# Patient Record
Sex: Female | Born: 1945 | Race: White | Hispanic: No | Marital: Married | Smoking: Light tobacco smoker
Health system: Southern US, Community
[De-identification: ages and names within clinical notes are randomized; demographics above are authoritative.]

## PROBLEM LIST (undated history)

## (undated) DIAGNOSIS — IMO0001 Reserved for inherently not codable concepts without codable children: Secondary | ICD-10-CM

## (undated) DIAGNOSIS — I10 Essential (primary) hypertension: Secondary | ICD-10-CM

## (undated) DIAGNOSIS — G459 Transient cerebral ischemic attack, unspecified: Secondary | ICD-10-CM

## (undated) DIAGNOSIS — F419 Anxiety disorder, unspecified: Secondary | ICD-10-CM

## (undated) DIAGNOSIS — J45909 Unspecified asthma, uncomplicated: Secondary | ICD-10-CM

## (undated) DIAGNOSIS — J449 Chronic obstructive pulmonary disease, unspecified: Secondary | ICD-10-CM

## (undated) DIAGNOSIS — E039 Hypothyroidism, unspecified: Secondary | ICD-10-CM

## (undated) DIAGNOSIS — D649 Anemia, unspecified: Secondary | ICD-10-CM

## (undated) DIAGNOSIS — M436 Torticollis: Secondary | ICD-10-CM

## (undated) HISTORY — PX: CARDIAC CATHETERIZATION: SHX172

## (undated) HISTORY — PX: CERVICAL FUSION: SHX112

## (undated) HISTORY — DX: Hypothyroidism, unspecified: E03.9

---
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
## 1997-04-23 HISTORY — PX: DILATION AND CURETTAGE OF UTERUS: SHX78

## 2005-09-14 ENCOUNTER — Other Ambulatory Visit: Payer: Self-pay

## 2005-09-15 ENCOUNTER — Inpatient Hospital Stay: Payer: Self-pay | Admitting: Internal Medicine

## 2006-11-21 ENCOUNTER — Ambulatory Visit: Payer: Self-pay | Admitting: Internal Medicine

## 2007-04-12 ENCOUNTER — Inpatient Hospital Stay: Payer: Self-pay | Admitting: Internal Medicine

## 2007-04-12 ENCOUNTER — Other Ambulatory Visit: Payer: Self-pay

## 2007-05-30 ENCOUNTER — Ambulatory Visit: Payer: Self-pay | Admitting: Endocrinology

## 2008-04-12 ENCOUNTER — Ambulatory Visit: Payer: Self-pay | Admitting: Internal Medicine

## 2008-12-30 ENCOUNTER — Emergency Department: Payer: Self-pay | Admitting: Emergency Medicine

## 2009-08-13 ENCOUNTER — Ambulatory Visit: Payer: Self-pay | Admitting: Internal Medicine

## 2009-09-24 ENCOUNTER — Ambulatory Visit: Payer: Self-pay | Admitting: Internal Medicine

## 2009-12-21 ENCOUNTER — Ambulatory Visit: Payer: Self-pay | Admitting: Endocrinology

## 2010-01-12 ENCOUNTER — Ambulatory Visit: Payer: Self-pay | Admitting: Cardiology

## 2010-03-24 ENCOUNTER — Ambulatory Visit: Payer: Self-pay | Admitting: Family Medicine

## 2011-02-06 ENCOUNTER — Ambulatory Visit: Payer: Self-pay

## 2011-02-23 ENCOUNTER — Ambulatory Visit: Payer: Self-pay | Admitting: Gastroenterology

## 2011-03-07 ENCOUNTER — Ambulatory Visit: Payer: Self-pay | Admitting: Internal Medicine

## 2013-03-31 ENCOUNTER — Ambulatory Visit: Payer: Self-pay | Admitting: Family Medicine

## 2013-04-07 ENCOUNTER — Emergency Department: Payer: Self-pay | Admitting: Emergency Medicine

## 2013-04-07 LAB — URINALYSIS, COMPLETE
Glucose,UR: NEGATIVE mg/dL (ref 0–75)
Ph: 6 (ref 4.5–8.0)
Protein: NEGATIVE
RBC,UR: 3 /HPF (ref 0–5)
Squamous Epithelial: 21
WBC UR: 2 /HPF (ref 0–5)

## 2013-04-07 LAB — COMPREHENSIVE METABOLIC PANEL
Albumin: 3.8 g/dL (ref 3.4–5.0)
Anion Gap: 0 — ABNORMAL LOW (ref 7–16)
Calcium, Total: 9.7 mg/dL (ref 8.5–10.1)
EGFR (African American): >60
EGFR (Non-African Amer.): >60
Osmolality: 274 (ref 275–301)
SGOT(AST): 17 U/L (ref 15–37)
SGPT (ALT): 23 U/L (ref 12–78)

## 2013-04-07 LAB — CBC
HCT: 41.3 % (ref 35.0–47.0)
HGB: 13.3 g/dL (ref 12.0–16.0)
MCH: 26.6 pg (ref 26.0–34.0)
RBC: 4.98 10*6/uL (ref 3.80–5.20)
RDW: 15.4 % — ABNORMAL HIGH (ref 11.5–14.5)

## 2013-07-31 ENCOUNTER — Ambulatory Visit: Payer: Self-pay | Admitting: Endocrinology

## 2014-10-27 ENCOUNTER — Encounter: Payer: Self-pay | Admitting: General Practice

## 2014-10-27 ENCOUNTER — Observation Stay
Admission: EM | Admit: 2014-10-27 | Discharge: 2014-10-28 | Disposition: A | Discharge status: Home / Self Care | Payer: Medicare Other | Attending: Internal Medicine | Admitting: Internal Medicine

## 2014-10-27 ENCOUNTER — Emergency Department: Payer: Medicare Other

## 2014-10-27 DIAGNOSIS — Z8249 Family history of ischemic heart disease and other diseases of the circulatory system: Secondary | ICD-10-CM | POA: Diagnosis not present

## 2014-10-27 DIAGNOSIS — Z9981 Dependence on supplemental oxygen: Secondary | ICD-10-CM | POA: Insufficient documentation

## 2014-10-27 DIAGNOSIS — Z7982 Long term (current) use of aspirin: Secondary | ICD-10-CM | POA: Insufficient documentation

## 2014-10-27 DIAGNOSIS — Z880 Allergy status to penicillin: Secondary | ICD-10-CM | POA: Diagnosis not present

## 2014-10-27 DIAGNOSIS — H538 Other visual disturbances: Secondary | ICD-10-CM | POA: Diagnosis not present

## 2014-10-27 DIAGNOSIS — I10 Essential (primary) hypertension: Secondary | ICD-10-CM | POA: Diagnosis not present

## 2014-10-27 DIAGNOSIS — J9610 Chronic respiratory failure, unspecified whether with hypoxia or hypercapnia: Secondary | ICD-10-CM | POA: Diagnosis present

## 2014-10-27 DIAGNOSIS — IMO0002 Reserved for concepts with insufficient information to code with codable children: Secondary | ICD-10-CM

## 2014-10-27 DIAGNOSIS — Z887 Allergy status to serum and vaccine status: Secondary | ICD-10-CM | POA: Diagnosis not present

## 2014-10-27 DIAGNOSIS — J449 Chronic obstructive pulmonary disease, unspecified: Secondary | ICD-10-CM | POA: Diagnosis not present

## 2014-10-27 DIAGNOSIS — H3561 Retinal hemorrhage, right eye: Secondary | ICD-10-CM | POA: Diagnosis not present

## 2014-10-27 DIAGNOSIS — Z79899 Other long term (current) drug therapy: Secondary | ICD-10-CM | POA: Diagnosis not present

## 2014-10-27 DIAGNOSIS — F17210 Nicotine dependence, cigarettes, uncomplicated: Secondary | ICD-10-CM | POA: Diagnosis not present

## 2014-10-27 DIAGNOSIS — H34233 Retinal artery branch occlusion, bilateral: Secondary | ICD-10-CM

## 2014-10-27 DIAGNOSIS — E039 Hypothyroidism, unspecified: Secondary | ICD-10-CM | POA: Diagnosis not present

## 2014-10-27 DIAGNOSIS — Z79890 Hormone replacement therapy (postmenopausal): Secondary | ICD-10-CM | POA: Insufficient documentation

## 2014-10-27 DIAGNOSIS — I639 Cerebral infarction, unspecified: Secondary | ICD-10-CM | POA: Diagnosis present

## 2014-10-27 DIAGNOSIS — H3582 Retinal ischemia: Secondary | ICD-10-CM | POA: Diagnosis present

## 2014-10-27 DIAGNOSIS — F419 Anxiety disorder, unspecified: Secondary | ICD-10-CM | POA: Diagnosis not present

## 2014-10-27 HISTORY — DX: Chronic obstructive pulmonary disease, unspecified: J44.9

## 2014-10-27 HISTORY — DX: Essential (primary) hypertension: I10

## 2014-10-27 HISTORY — DX: Anxiety disorder, unspecified: F41.9

## 2014-10-27 HISTORY — DX: Reserved for inherently not codable concepts without codable children: IMO0001

## 2014-10-27 LAB — CBC
HCT: 40.8 % (ref 35.0–47.0)
HEMOGLOBIN: 13.1 g/dL (ref 12.0–16.0)
MCH: 28 pg (ref 26.0–34.0)
MCHC: 32 g/dL (ref 32.0–36.0)
MCV: 87.4 fL (ref 80.0–100.0)
Platelets: 138 10*3/uL — ABNORMAL LOW (ref 150–440)
RBC: 4.67 MIL/uL (ref 3.80–5.20)
RDW: 14.7 % — ABNORMAL HIGH (ref 11.5–14.5)
WBC: 5.9 10*3/uL (ref 3.6–11.0)

## 2014-10-27 LAB — DIFFERENTIAL
BASOS ABS: 0 10*3/uL (ref 0–0.1)
BASOS PCT: 0 %
Eosinophils Absolute: 0 10*3/uL (ref 0–0.7)
Eosinophils Relative: 1 %
LYMPHS PCT: 25 %
Lymphs Abs: 1.5 10*3/uL (ref 1.0–3.6)
MONO ABS: 0.5 10*3/uL (ref 0.2–0.9)
Monocytes Relative: 8 %
Neutro Abs: 3.9 10*3/uL (ref 1.4–6.5)
Neutrophils Relative %: 66 %

## 2014-10-27 LAB — PROTIME-INR
INR: 0.89
Prothrombin Time: 12.3 seconds (ref 11.4–15.0)

## 2014-10-27 LAB — COMPREHENSIVE METABOLIC PANEL
ALT: 12 U/L — ABNORMAL LOW (ref 14–54)
AST: 16 U/L (ref 15–41)
Albumin: 4.4 g/dL (ref 3.5–5.0)
Alkaline Phosphatase: 55 U/L (ref 38–126)
Anion gap: 8 (ref 5–15)
BUN: 9 mg/dL (ref 6–20)
CALCIUM: 9.2 mg/dL (ref 8.9–10.3)
CO2: 38 mmol/L — AB (ref 22–32)
CREATININE: 0.38 mg/dL — AB (ref 0.44–1.00)
Chloride: 94 mmol/L — ABNORMAL LOW (ref 101–111)
GLUCOSE: 93 mg/dL (ref 65–99)
Potassium: 4.2 mmol/L (ref 3.5–5.1)
Sodium: 140 mmol/L (ref 135–145)
Total Bilirubin: 0.6 mg/dL (ref 0.3–1.2)
Total Protein: 7.3 g/dL (ref 6.5–8.1)

## 2014-10-27 LAB — APTT: aPTT: 30 seconds (ref 24–36)

## 2014-10-27 LAB — TSH: TSH: 1.573 u[IU]/mL (ref 0.350–4.500)

## 2014-10-27 LAB — GLUCOSE, CAPILLARY: GLUCOSE-CAPILLARY: 93 mg/dL (ref 65–99)

## 2014-10-27 LAB — TROPONIN I

## 2014-10-27 MED ORDER — ASPIRIN 325 MG PO TABS
325.0000 mg | ORAL_TABLET | Freq: Every day | ORAL | Status: DC
  Administered 2014-10-28: 325 mg via ORAL
  Filled 2014-10-27 (×2): qty 1

## 2014-10-27 MED ORDER — ALPRAZOLAM 0.25 MG PO TABS
0.2500 mg | ORAL_TABLET | Freq: Two times a day (BID) | ORAL | Status: DC | PRN
  Administered 2014-10-28: 0.5 mg via ORAL
  Filled 2014-10-27: qty 2

## 2014-10-27 MED ORDER — SIMVASTATIN 20 MG PO TABS
20.0000 mg | ORAL_TABLET | Freq: Every day | ORAL | Status: DC
  Administered 2014-10-27: 20 mg via ORAL
  Filled 2014-10-27: qty 1

## 2014-10-27 MED ORDER — STROKE: EARLY STAGES OF RECOVERY BOOK
Freq: Once | Status: AC
  Administered 2014-10-27: 22:00:00

## 2014-10-27 MED ORDER — MONTELUKAST SODIUM 10 MG PO TABS
10.0000 mg | ORAL_TABLET | Freq: Every day | ORAL | Status: DC
  Administered 2014-10-27: 10 mg via ORAL
  Filled 2014-10-27: qty 1

## 2014-10-27 MED ORDER — LAMOTRIGINE 25 MG PO TABS
25.0000 mg | ORAL_TABLET | Freq: Every day | ORAL | Status: DC
  Filled 2014-10-27: qty 1

## 2014-10-27 MED ORDER — DOCUSATE SODIUM 100 MG PO CAPS: 100.0000 mg | ORAL_CAPSULE | Freq: Every day | ORAL | Status: DC

## 2014-10-27 MED ORDER — IPRATROPIUM-ALBUTEROL 0.5-2.5 (3) MG/3ML IN SOLN: 3.0000 mL | RESPIRATORY_TRACT | Status: DC | PRN

## 2014-10-27 MED ORDER — ACETAMINOPHEN 650 MG RE SUPP: 650.0000 mg | RECTAL | Status: DC | PRN

## 2014-10-27 MED ORDER — HEPARIN SODIUM (PORCINE) 5000 UNIT/ML IJ SOLN: 5000.0000 [IU] | Freq: Three times a day (TID) | INTRAMUSCULAR | Status: DC

## 2014-10-27 MED ORDER — LEVOTHYROXINE SODIUM 88 MCG PO TABS
88.0000 ug | ORAL_TABLET | Freq: Every day | ORAL | Status: DC
  Administered 2014-10-27 – 2014-10-28 (×2): 88 ug via ORAL
  Filled 2014-10-27: qty 1

## 2014-10-27 MED ORDER — NYSTATIN 100000 UNIT/ML MT SUSP: 5.0000 mL | OROMUCOSAL | Status: DC | PRN

## 2014-10-27 MED ORDER — ASPIRIN 81 MG PO CHEW
81.0000 mg | CHEWABLE_TABLET | Freq: Every day | ORAL | Status: DC
Start: 2014-10-27 — End: 2014-10-28
  Filled 2014-10-27: qty 1

## 2014-10-27 MED ORDER — TIOTROPIUM BROMIDE MONOHYDRATE 18 MCG IN CAPS
18.0000 ug | ORAL_CAPSULE | Freq: Every day | RESPIRATORY_TRACT | Status: DC
  Administered 2014-10-28: 14:00:00 18 ug via RESPIRATORY_TRACT
  Filled 2014-10-27: qty 5

## 2014-10-27 MED ORDER — ASPIRIN 300 MG RE SUPP
300.0000 mg | Freq: Every day | RECTAL | Status: DC
  Filled 2014-10-27 (×2): qty 1

## 2014-10-27 MED ORDER — ACETAMINOPHEN 325 MG PO TABS: 650.0000 mg | ORAL_TABLET | ORAL | Status: DC | PRN

## 2014-10-27 MED ORDER — OLANZAPINE 2.5 MG PO TABS
2.5000 mg | ORAL_TABLET | Freq: Every day | ORAL | Status: DC
  Administered 2014-10-27: 2.5 mg via ORAL
  Filled 2014-10-27: qty 1

## 2014-10-27 MED ORDER — AMLODIPINE BESYLATE 5 MG PO TABS
5.0000 mg | ORAL_TABLET | Freq: Every day | ORAL | Status: DC
  Administered 2014-10-27: 5 mg via ORAL
  Filled 2014-10-27: qty 1

## 2014-10-27 MED ORDER — HYDRALAZINE HCL 20 MG/ML IJ SOLN: 10.0000 mg | Freq: Four times a day (QID) | INTRAMUSCULAR | Status: DC | PRN

## 2014-10-27 MED ORDER — VENLAFAXINE HCL ER 75 MG PO CP24
150.0000 mg | ORAL_CAPSULE | Freq: Every day | ORAL | Status: DC
  Administered 2014-10-28: 11:00:00 150 mg via ORAL
  Filled 2014-10-27: qty 2

## 2014-10-27 MED ORDER — LEVALBUTEROL HCL 1.25 MG/0.5ML IN NEBU: 1.2500 mg | INHALATION_SOLUTION | RESPIRATORY_TRACT | Status: DC | PRN

## 2014-10-27 NOTE — H&P (Signed)
[facility] - [place] at [facility]   PATIENT NAME: Shannon Wilkerson    MR#:  [id number]  DATE OF BIRTH:  [birth year]  DATE OF ADMISSION:  10/27/2014  PRIMARY CARE PHYSICIAN: Alan Mulder, MD   REQUESTING/REFERRING PHYSICIAN:  Dr. Lenard Lance   CHIEF COMPLAINT:   Chief Complaint  Patient presents with  . Blurred Vision    HISTORY OF PRESENT ILLNESS:  Shannon Wilkerson  is a 69 y.o. female with a known history of COPD on chronic oxygen via nasal cannula at 2 L, hypertension who presents to the emergency room after her ophthalmology appointment today with concern for stroke due to new findings of multiple retinal hemorrhages. She reports that about 5 days ago she started having "fireworks" in the right eye which became more pronounced and more persistent. She was watching television and noted a gray defect in the lower part of her visual field with the right eye. She called for at the soonest available appointment with her ophthalmologist and was referred to a retinal specialist. She saw them today and was immediately referred to the emergency room for stroke evaluation. She denies any confusion, slurred speech, numbness or weakness. She does have a long history of palpitations but no known arrhythmia. She denies chest pain, lower extremity edema. She does note that she has episodes of vertigo particularly with head movement for the past few months. In the emergency room her EKG shows normal sinus rhythm and CT scan is negative for acute event.  PAST MEDICAL HISTORY:   Past Medical History  Diagnosis Date  . COPD (chronic obstructive pulmonary disease)   . Hypertension   . Shortness of breath dyspnea   . Anxiety     PAST SURGICAL HISTORY:   Past Surgical History  Procedure Laterality Date  . Cervical fusion      SOCIAL HISTORY:   History  Substance Use Topics  . Smoking status: Current Some Day Smoker    Types: Cigarettes  . Smokeless tobacco: Not on  file  . Alcohol Use: No    FAMILY HISTORY:   Family History  Problem Relation Age of Onset  . Stroke Mother   . CAD Mother   . CAD Father   . Alcohol abuse Brother     DRUG ALLERGIES:   Allergies  Allergen Reactions  . Fluvirin [Flu Virus Vaccine] Diarrhea  . Penicillins Hives  . Tetanus Toxoids Diarrhea    REVIEW OF SYSTEMS:   Review of Systems  Constitutional: Negative for fever, chills, weight loss and malaise/fatigue.  HENT: Negative for congestion and hearing loss.   Eyes: Positive for blurred vision. Negative for double vision, pain and discharge.  Respiratory: Negative for cough, hemoptysis, sputum production, shortness of breath, wheezing and stridor.   Cardiovascular: Negative for chest pain, palpitations, orthopnea and leg swelling.  Gastrointestinal: Negative for nausea, vomiting, abdominal pain, diarrhea, constipation and blood in stool.  Genitourinary: Negative for dysuria and frequency.  Musculoskeletal: Negative for myalgias, back pain, joint pain and neck pain.  Skin: Negative for rash.  Neurological: Positive for dizziness. Negative for sensory change, speech change, focal weakness, seizures, loss of consciousness and headaches.  Endo/Heme/Allergies: Does not bruise/bleed easily.  Psychiatric/Behavioral: Negative for depression and hallucinations. The patient is nervous/anxious.     MEDICATIONS AT HOME:   Prior to Admission medications   Medication Sig Start Date End Date Taking? Authorizing Provider  ALPRAZolam Prudy Feeler) 0.5 MG tablet Take 0.25-0.5 mg by mouth 2 (two) times daily as needed for  anxiety.    Yes Historical Provider, MD  Ascorbic Acid (VITAMIN C PO) Take 1 tablet by mouth daily.   Yes Historical Provider, MD  diphenhydramine-acetaminophen (TYLENOL PM) 25-500 MG TABS Take 2 tablets by mouth at bedtime as needed (for sleep).   Yes Historical Provider, MD  docusate sodium (COLACE) 100 MG capsule Take 100 mg by mouth at bedtime.   Yes Historical  Provider, MD  estrogen, conjugated,-medroxyprogesterone (PREMPRO) 0.625-2.5 MG per tablet Take 1 tablet by mouth 2 (two) times a week. Pt takes on Tuesday and Thursday.   Yes Historical Provider, MD  ipratropium-albuterol (DUONEB) 0.5-2.5 (3) MG/3ML SOLN Take 3 mLs by nebulization every 4 (four) hours as needed (for shortness of breath).   Yes Historical Provider, MD  lamoTRIgine (LAMICTAL) 25 MG tablet Take 25 mg by mouth at bedtime.   Yes Historical Provider, MD  levalbuterol [facility](XOPENEX HFA) 45 MCG/ACT inhaler Inhale 2 puffs into the lungs every 4 (four) hours as needed for wheezing.   Yes Historical Provider, MD  levothyroxine (SYNTHROID, LEVOTHROID) 88 MCG tablet Take 88 mcg by mouth daily.   Yes Historical Provider, MD  montelukast (SINGULAIR) 10 MG tablet Take 10 mg by mouth at bedtime.   Yes Historical Provider, MD  Multiple Vitamins-Minerals (ZINC PO) Take 1 tablet by mouth daily.   Yes Historical Provider, MD  nystatin (MYCOSTATIN) 100000 UNIT/ML suspension Take 5 mLs by mouth as needed (for thrush).   Yes Historical Provider, MD  OLANZapine (ZYPREXA) 2.5 MG tablet Take 2.5 mg by mouth at bedtime.   Yes Historical Provider, MD  simvastatin (ZOCOR) 20 MG tablet Take 20 mg by mouth at bedtime.   Yes Historical Provider, MD  tiotropium (SPIRIVA) 18 MCG inhalation capsule Place 18 mcg into inhaler and inhale daily.   Yes Historical Provider, MD  venlafaxine XR (EFFEXOR-XR) 150 MG 24 hr capsule Take 150 mg by mouth daily.   Yes Historical Provider, MD  Vitamin D, Ergocalciferol, (DRISDOL) 50000 UNITS CAPS capsule Take 50,000 Units by mouth every 7 (seven) days. Pt takes on Sunday.   Yes Historical Provider, MD  VITAMIN E PO Take 1 tablet by mouth daily.   Yes Historical Provider, MD      VITAL SIGNS:  Blood pressure 172/88, pulse 88, temperature 98.5 F (36.9 C), temperature source Oral, resp. rate 14, height 5\' 2"  (1.575 m), weight 68.04 kg (150 lb), SpO2 100 %.  PHYSICAL EXAMINATION:   GENERAL:  69 y.o.-year-old patient lying in the bed with no acute distress. Nasal cannula in place  EYES: Pupils equal, dilated. No scleral icterus. Extraocular muscles intact.  HEENT: Head atraumatic, normocephalic. Oropharynx and nasopharynx clear. Mucous membranes pink and moist NECK:  Supple, no jugular venous distention. No thyroid enlargement, no tenderness.  LUNGS: Scattered wheezes on the right upper lung fields, otherwise no rales, rhonchi or crepitation. No use of accessory muscles of respiration. Good air movement CARDIOVASCULAR: S1, S2 normal. No murmurs, rubs, or gallops.  ABDOMEN: Soft, nontender, nondistended. Bowel sounds present. No organomegaly or mass.  EXTREMITIES: Trace pedal edema over the left ankle, no cyanosis, or clubbing.  NEUROLOGIC: Cranial nerves II through XII are intact. Muscle strength 5/5 in all extremities. Sensation intact. Gait not checked.  PSYCHIATRIC: The patient is alert and oriented x 3. Anxious and tearful SKIN: No obvious rash, lesion, or ulcer.   LABORATORY PANEL:   CBC  Recent Labs Lab 10/27/14 1703  WBC 5.9  HGB 13.1  HCT 40.8  PLT 138*   ------------------------------------------------------------------------------------------------------------------  Chemistries   Recent Labs Lab 10/27/14 1703  NA 140  K 4.2  CL 94*  CO2 38*  GLUCOSE 93  BUN 9  CREATININE 0.38*  CALCIUM 9.2  AST 16  ALT 12*  ALKPHOS 55  BILITOT 0.6   ------------------------------------------------------------------------------------------------------------------  Cardiac Enzymes  Recent Labs Lab 10/27/14 1703  TROPONINI <0.03   ------------------------------------------------------------------------------------------------------------------  RADIOLOGY:  Ct Head Wo Contrast  10/27/2014   CLINICAL DATA:  69 year old female with visual changes over the last few days. Right eye blurred vision. Initial encounter.  EXAM: CT HEAD WITHOUT CONTRAST   TECHNIQUE: Contiguous axial images were obtained from the base of the skull through the vertex without intravenous contrast.  COMPARISON:  None.  FINDINGS: Mild right mastoid air cell opacification or sclerosis. Other visualized paranasal sinuses and mastoids are clear. The right tympanic cavity appears clear.  No acute osseous abnormality identified.  Visualized orbit soft tissues are within normal limits. Visualized scalp soft tissues are within normal limits.  Mild Calcified atherosclerosis at the skull base. Cerebral volume is within normal limits for age. No ventriculomegaly. No midline shift, mass effect, or evidence of intracranial mass lesion. No suspicious intracranial vascular hyperdensity. Mild for age nonspecific mostly subcortical white matter hypodensity. No evidence of cortically based acute infarction identified. Questionable mild cortical encephalomalacia along the right sylvian fissure (series 2, image 12). No acute intracranial hemorrhage identified.  IMPRESSION: 1. No acute cortically based infarct or acute intracranial abnormality identified. 2. Mild for age nonspecific white matter changes. Possible mild cortical encephalomalacia along the right sylvian fissure.   Electronically Signed   By: Odessa Fleming M.D.   On: 10/27/2014 17:29    EKG:   Orders placed or performed during the hospital encounter of 10/27/14  . EKG  . EKG    IMPRESSION AND PLAN:  Principal Problem:   CVA (cerebral infarction) Active Problems:   COPD (chronic obstructive pulmonary disease)   Chronic respiratory failure   Anxiety   Hypertension  #1 possible CVA: Patient referred directly to the emergency room by her ophthalmologist after new finding of multiple retinal hemorrhages in a "shower" pattern today. CT scan negative. She has no other neurologic defects. Will obtain an MRI and carotid Dopplers. Will admit to observation and monitor on telemetry. We will check lipids and hemoglobin A1c. Start aspirin. If  stroke workup is negative would consider workup for other potential causes of retinal hemorrhage including autoimmune disorders or infectious etiologies.   #2 COPD with chronic respiratory failure requiring 2 L of nasal cannula: Stable and unchanged. Continue supplemental oxygen. Continue home inhalers.  #3 hypertension: Poorly controlled. In the setting of possible acute stroke we'll allow for permissive hypertension. We'll provide when necessary hydralazine for systolics above 180. Upon discharge or in the near future she would benefit from improved blood pressure control.  #4 anxiety: She is anxious and tearful in the exam. Will continue home medications including Effexor, Zyprexa, Lamictal, Xanax.  #5 hypothyroidism: Check TSH and continue Synthroid    All the records are reviewed and case discussed with ED provider. Management plans discussed with the patient, family and they are in agreement.  CODE STATUS: full  TOTAL TIME TAKING CARE OF THIS PATIENT: 55 minutes.    Elby Showers M.D on 10/27/2014 at 7:22 PM  Between 7am to 6pm - Pager - [phone]  After 6pm go to [email or web address] - password [email or web address] [facility]  [facility]  Office  [phone]  CC: Primary care physician; Alan Mulder, MD

## 2014-10-27 NOTE — ED Notes (Signed)
Pt. Arrived from eye apt, sent over due to "doctor said i had a stroke in my right eye". Pt reported that she has been experiencing vision changes over the last few days. Pt states she started having "flashes to the right eye" over the last few days and on Saturday she felt a "grayness" to eye. Pt alert and oriented. Denies weakness, denies slurred speech. Pt states "it was just my eye".

## 2014-10-27 NOTE — ED Provider Notes (Signed)
[facility] Emergency Department Provider Note  Time seen: 5:56 PM  I have reviewed the triage vital signs and the nursing notes.   HISTORY  Chief Complaint Blurred Vision    HPI Shannon Wilkerson is a 69 y.o. female with a past medical history of COPD and hypertension presents the emergency department from her eye doctor for concern for a stroke. According to the patient for the past 4 days she has been having intermittent flashes of light, and visual changes with decreased vision in bilateral eyes. She saw Dr. Sharman Crate today at [facility], and was told to come to the emergency department for a stroke workup. I discussed with Dr. Sharman Crate, and he states the exam was concerning for bilateral central retinal artery occlusions from likely embolic showering. Patient denies any headache, focal weakness or numbness, slurred speech, confusion, or facial droop. Patient describes her visual changes as moderate, they've not been worsening, but have not been improving per the patient.     Past Medical History  Diagnosis Date  . COPD (chronic obstructive pulmonary disease)   . Hypertension     There are no active problems to display for this patient.   Past Surgical History  Procedure Laterality Date  . Cervical fusion      No current outpatient prescriptions on file.  Allergies Penicillins and Tetanus toxoids  No family history on file.  Social History History  Substance Use Topics  . Smoking status: Current Some Day Smoker    Types: Cigarettes  . Smokeless tobacco: Not on file  . Alcohol Use: No    Review of Systems Constitutional: Negative for fever. Eyes: Positive for decreased vision and flashes of light in bilateral eyes. Cardiovascular: Negative for chest pain. Respiratory: Negative for shortness of breath. Gastrointestinal: Negative for abdominal pain Musculoskeletal: Negative for back pain. Neurological: Negative for headaches,  focal weakness or numbness.  10-point ROS otherwise negative.  ____________________________________________   PHYSICAL EXAM:  VITAL SIGNS: ED Triage Vitals  Enc Vitals Group     BP 10/27/14 1654 162/107 mmHg     Pulse Rate 10/27/14 1654 98     Resp 10/27/14 1654 18     Temp 10/27/14 1654 98.5 F (36.9 C)     Temp Source 10/27/14 1654 Oral     SpO2 10/27/14 1654 97 %     Weight 10/27/14 1654 150 lb (68.04 kg)     Height 10/27/14 1654  (1.575 m)     Head Cir --      Peak Flow --      Pain Score --      Pain Loc --      Pain Edu? --      Excl. in GC? --     Constitutional: Alert and oriented. Well appearing and in no distress. Eyes: Currently dilated bilaterally ENT   Head: Normocephalic and atraumatic. Cardiovascular: Normal rate, regular rhythm. No murmur Respiratory: Normal respiratory effort without tachypnea nor retractions. Breath sounds are clear and equal bilaterally. No wheezes/rales/rhonchi. Gastrointestinal: Soft and nontender. No distention. Musculoskeletal: Nontender with normal range of motion in all extremities.  Neurologic:  Normal speech and language. No gross focal neurologic deficits are appreciated. Speech is normal. No pronator drift. Cranial nerves appear largely intact (eyes are currently dilated from exam).  Skin:  Skin is warm, dry and intact.  Psychiatric: Mood and affect are normal. Speech and behavior are normal. Patient exhibits appropriate insight and judgment.  ____________________________________________  EKG  EKG reviewed and interpreted by myself shows normal sinus rhythm at 95 bpm, narrow QRS, normal axis, normal intervals, no ST changes noted. Overall normal EKG.  ____________________________________________    RADIOLOGY  CT scan of the head shows no acute abnormalities.  ____________________________________________   INITIAL IMPRESSION / ASSESSMENT AND PLAN / ED COURSE  Pertinent labs & imaging results that were  available during my care of the patient were reviewed by me and considered in my medical decision making (see chart for details).  Labs and CT a large within normal limits. EKG shows normal sinus rhythm. I discussed with [facility]. Sharman [facility]rateBrassington [facility] who states her exam is concerning for embolic showering, and that she would need admission for a stroke workup including ultrasounds for carotids, heart, and MRI. I discussed these findings with the patient who is agreeable to plan. Patient appears very well currently, no focal neurologic deficits are identified on exam.  ____________________________________________   FINAL CLINICAL IMPRESSION(S) / ED DIAGNOSES  Bilateral central retinal artery occlusions   Minna AntisKevin Simar Pothier, MD 10/27/14 1800

## 2014-10-28 ENCOUNTER — Observation Stay
Admit: 2014-10-28 | Discharge: 2014-10-28 | Disposition: A | Discharge status: Home / Self Care | Payer: Medicare Other | Attending: Internal Medicine | Admitting: Internal Medicine

## 2014-10-28 ENCOUNTER — Observation Stay: Payer: Medicare Other

## 2014-10-28 DIAGNOSIS — H3561 Retinal hemorrhage, right eye: Secondary | ICD-10-CM | POA: Diagnosis not present

## 2014-10-28 LAB — LIPID PANEL
CHOLESTEROL: 180 mg/dL (ref 0–200)
HDL: 59 mg/dL (ref >40)
LDL Cholesterol: 102 mg/dL — ABNORMAL HIGH (ref 0–99)
Total CHOL/HDL Ratio: 3.1 RATIO
Triglycerides: 96 mg/dL (ref <150)
VLDL: 19 mg/dL (ref 0–40)

## 2014-10-28 LAB — HEMOGLOBIN A1C: HEMOGLOBIN A1C: 5.4 % (ref 4.0–6.0)

## 2014-10-28 MED ORDER — ASPIRIN 325 MG PO TABS: 325.0000 mg | ORAL_TABLET | Freq: Every day | ORAL | Status: DC

## 2014-10-28 MED ORDER — AMLODIPINE BESYLATE 10 MG PO TABS: 10.0000 mg | ORAL_TABLET | Freq: Every day | ORAL | Status: DC

## 2014-10-28 MED ORDER — AMLODIPINE BESYLATE 10 MG PO TABS
10.0000 mg | ORAL_TABLET | Freq: Every day | ORAL | Status: DC
  Administered 2014-10-28: 10 mg via ORAL
  Filled 2014-10-28: qty 1

## 2014-10-28 NOTE — Plan of Care (Signed)
Problem: Discharge/Transitional Outcomes Goal: Other Discharge Outcomes/Goals Outcome: Progressing Individualization: Pt is retire and lives at home with husband, blurred vision, and headache x 1 week. Admitted for Stroke workup.   Progression of goals towards discharge: 1Ct was negative and MRI and Carotid dopper will be done in the Am. Perform actions to prevent hypertension in order to reduce the risk of cerebral embolism:  1. implement measures to reduce stress (e.g. explain procedures, maintain calm environment) 2. administer antihypertensives as ordered 2NIH scale score was 1 times two times, no deficits noted, other then blurred vision 3Pt has history of cataracts, seen at  eye center for this: today was diagnosed with retinol occlusions and "showering emboli" in the retinal artery 4. Pt is on tele 5.Heparin held until hemorraghic stroke is ruled out. SCD's on 6. Uncontrolled hypertension, monitor closely and treat with PRN medications as ordered, Dr states they are ok with systolic >160 until stroke is ruled out 7. Speech eval not needed pt passed nursing stroke swallow scale 8. Q2h neuro checks 9. Pt on bedrest until stroke is ruled out

## 2014-10-28 NOTE — Discharge Summary (Signed)
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 COPD (chronic obstructive pulmonary disease)   . Hypertension   . Shortness of breath dyspnea   . Anxiety     HOSPITAL COURSE:  69 y.o. female with a known history of COPD on chronic oxygen via nasal cannula at 2 L, hypertension who presents to the emergency room after her ophthalmology appointment today with concern for stroke due to new findings of multiple retinal hemorrhages. She reports that about 5 days ago she started having "fireworks" in the right eye which became more pronounced and more persistent.  #1 possible CVA: Patient referred directly to the emergency room by her ophthalmologist after new finding of multiple retinal hemorrhages in a "shower" pattern today. CT scan negative. She has no other neurologic defects. -pt unable to lay flat for MRI due to her severe COPD -spoke with Neurology. No indication for it  - carotid Dopplers left <50% and right 50-69% carotid stenosis. -spoke with Dr dew ok to f/u as out pt -cont asa 325 mg daily  #2 COPD with chronic respiratory failure requiring 2 L of nasal cannula: Stable and unchanged. Continue supplemental oxygen. Continue home inhalers.  #3 hypertension-New dx: Poorly controlled. In the setting of possible acute stroke we'll allow for permissive hypertension.  -BP on the higher side -will start po norvasc. Pt to f/u Dr Kerrie Pleasure for  her BP checks  #4 anxiety: continue home medications including Effexor, Zyprexa, Lamictal, Xanax.  #5 hypothyroidism: continue Synthroid  DISCHARGE CONDITIONS:   fair CONSULTS OBTAINED:   none Curbsided Neurology  DRUG ALLERGIES:   Allergies  Allergen Reactions  . Fluvirin [Flu Virus Vaccine] Diarrhea  . Penicillins Hives  . Tetanus Toxoids Diarrhea    DISCHARGE MEDICATIONS:   Current Discharge Medication List    START taking these medications   Details  amLODipine (NORVASC) 10 MG tablet Take 1 tablet (10 mg total) by mouth daily. Qty: 30 tablet, Refills: 0    aspirin 325 MG tablet Take 1 tablet (325 mg total) by mouth daily. Qty: 30 tablet, Refills: 0      CONTINUE these medications which have NOT CHANGED   Details  ALPRAZolam (XANAX) 0.5 MG tablet Take 0.25-0.5 mg by mouth 2 (two) times daily as needed for anxiety.     Ascorbic Acid (VITAMIN C PO) Take 1 tablet by mouth daily.    diphenhydramine-acetaminophen (TYLENOL PM) 25-500 MG TABS Take 2 tablets by mouth at bedtime as needed (for sleep).    docusate sodium (COLACE) 100 MG capsule Take 100 mg by mouth at bedtime.    estrogen, conjugated,-medroxyprogesterone (PREMPRO) 0.625-2.5 MG per tablet Take 1 tablet by mouth 2 (two) times a week. Pt takes on Tuesday and Thursday.    ipratropium-albuterol (DUONEB) 0.5-2.5 (3) MG/3ML SOLN Take 3 mLs by nebulization every 4 (four) hours as needed (for shortness of breath).    lamoTRIgine (LAMICTAL) 25 MG tablet Take 25 mg by mouth at bedtime.  levalbuterol (XOPENEX HFA) 45 MCG/ACT inhaler Inhale 2 puffs into the lungs every 4 (four) hours as needed for wheezing.    levothyroxine (SYNTHROID, LEVOTHROID) 88 MCG tablet Take 88 mcg by mouth daily.    montelukast (SINGULAIR) 10 MG tablet Take 10 mg by mouth at bedtime.    Multiple Vitamins-Minerals (ZINC PO) Take 1 tablet by mouth daily.    nystatin (MYCOSTATIN) 100000 UNIT/ML suspension Take 5 mLs by mouth as needed  (for thrush).    OLANZapine (ZYPREXA) 2.5 MG tablet Take 2.5 mg by mouth at bedtime.    simvastatin (ZOCOR) 20 MG tablet Take 20 mg by mouth at bedtime.    tiotropium (SPIRIVA) 18 MCG inhalation capsule Place 18 mcg into inhaler and inhale daily.    venlafaxine XR (EFFEXOR-XR) 150 MG 24 hr capsule Take 150 mg by mouth daily.    Vitamin D, Ergocalciferol, (DRISDOL) 50000 UNITS CAPS capsule Take 50,000 Units by mouth every 7 (seven) days. Pt takes on Sunday.    VITAMIN E PO Take 1 tablet by mouth daily.        If you experience worsening of your admission symptoms, develop shortness of breath, life threatening emergency, suicidal or homicidal thoughts you must seek medical attention immediately by calling 911 or calling your MD immediately  if symptoms less severe.  You Must read complete instructions/literature along with all the possible adverse reactions/side effects for all the Medicines you take and that have been prescribed to you. Take any new Medicines after you have completely understood and accept all the possible adverse reactions/side effects.   Please note  You were cared for by a hospitalist during your hospital stay. If you have any questions about your discharge medications or the care you received while you were in the hospital after you are discharged, you can call the unit and asked to speak with the hospitalist on call if the hospitalist that took care of you is not available. Once you are discharged, your primary care physician will handle any further medical issues. Please note that NO REFILLS for any discharge medications will be authorized once you are discharged, as it is imperative that you return to your primary care physician (or establish a relationship with a primary care physician if you do not have one) for your aftercare needs so that they can reassess your need for medications and monitor your lab values. Today   SUBJECTIVE   No compalints at present. Right  eye vision same no worsening  VITAL SIGNS:  Blood pressure 132/59, pulse 84, temperature 98 F (36.7 C), temperature source Oral, resp. rate 11, height 5\' 2"  (1.575 m), weight 69.4 kg (153 lb), SpO2 98 %.  I/O:   Intake/Output Summary (Last 24 hours) at 10/28/14 1251 Last data filed at 10/28/14 1610  Gross per 24 hour  Intake      0 ml  Output    200 ml  Net   -200 ml    PHYSICAL EXAMINATION:  GENERAL:  69 y.o.-year-old patient lying in the bed with no acute distress.  EYES: Pupils equal, round, reactive to light and accommodation. No scleral icterus. Extraocular muscles intact.  HEENT: Head atraumatic, normocephalic. Oropharynx and nasopharynx clear.  NECK:  Supple, no jugular venous distention. No thyroid enlargement, no tenderness.  LUNGS: decreased breath sounds bilaterally, no wheezing, rales,rhonchi or crepitation. No use of accessory muscles of respiration.  CARDIOVASCULAR: S1, S2 normal. No murmurs, rubs, or gallops.  ABDOMEN: Soft, non-tender, non-distended. Bowel sounds present. No organomegaly  or mass.  EXTREMITIES: No pedal edema, cyanosis, or clubbing.  NEUROLOGIC: Cranial nerves II through XII are intact. Muscle strength 5/5 in all extremities. Sensation intact. Gait not checked.  PSYCHIATRIC: The patient is alert and oriented x 3.  SKIN: No obvious rash, lesion, or ulcer.   DATA REVIEW:   CBC   Recent Labs Lab 10/27/14 1703  WBC 5.9  HGB 13.1  HCT 40.8  PLT 138*    Chemistries   Recent Labs Lab 10/27/14 1703  NA 140  K 4.2  CL 94*  CO2 38*  GLUCOSE 93  BUN 9  CREATININE 0.38*  CALCIUM 9.2  AST 16  ALT 12*  ALKPHOS 55  BILITOT 0.6    Microbiology Results   No results found for this or any previous visit (from the past 240 hour(s)).  RADIOLOGY:  Ct Head Wo Contrast  10/27/2014   CLINICAL DATA:  69 year old female with visual changes over the last few days. Right eye blurred vision. Initial encounter.  EXAM: CT HEAD WITHOUT CONTRAST   TECHNIQUE: Contiguous axial images were obtained from the base of the skull through the vertex without intravenous contrast.  COMPARISON:  None.  FINDINGS: Mild right mastoid air cell opacification or sclerosis. Other visualized paranasal sinuses and mastoids are clear. The right tympanic cavity appears clear.  No acute osseous abnormality identified.  Visualized orbit soft tissues are within normal limits. Visualized scalp soft tissues are within normal limits.  Mild Calcified atherosclerosis at the skull base. Cerebral volume is within normal limits for age. No ventriculomegaly. No midline shift, mass effect, or evidence of intracranial mass lesion. No suspicious intracranial vascular hyperdensity. Mild for age nonspecific mostly subcortical white matter hypodensity. No evidence of cortically based acute infarction identified. Questionable mild cortical encephalomalacia along the right sylvian fissure (series 2, image 12). No acute intracranial hemorrhage identified.  IMPRESSION: 1. No acute cortically based infarct or acute intracranial abnormality identified. 2. Mild for age nonspecific white matter changes. Possible mild cortical encephalomalacia along the right sylvian fissure.   Electronically Signed   By: Odessa FlemingH  Hall M.D.   On: 10/27/2014 17:29   [place] Carotid Bilateral  10/28/2014   CLINICAL DATA:  Retinal infarction.  EXAM: BILATERAL CAROTID DUPLEX ULTRASOUND  TECHNIQUE: Wallace CullensGray scale imaging, color Doppler and duplex ultrasound were performed of bilateral carotid and vertebral arteries in the neck.  COMPARISON:  None.  FINDINGS: Criteria: Quantification of carotid stenosis is based on velocity parameters that correlate the residual internal carotid diameter with NASCET-based stenosis levels, using the diameter of the distal internal carotid lumen as the denominator for stenosis measurement.  The following velocity measurements were obtained:  RIGHT  ICA:  144/55 cm/sec  CCA:  68/18 cm/sec  SYSTOLIC ICA/CCA RATIO:   2.08  DIASTOLIC ICA/CCA RATIO:  3.02  ECA:  90 cm/sec  LEFT  ICA:  107/43 cm/sec  CCA:  78/21 cm/sec  SYSTOLIC ICA/CCA RATIO:  1.37  DIASTOLIC ICA/CCA RATIO:  2.04  ECA:  103 cm/sec  RIGHT CAROTID ARTERY: Calcified irregular plaque formation is noted in the right carotid bulb and proximal right internal carotid artery consistent with 50-69% stenosis based on ultrasound and Doppler criteria.  RIGHT VERTEBRAL ARTERY:  Antegrade flow is noted.  LEFT CAROTID ARTERY: Mild plaque formation is noted in the proximal left internal carotid artery consistent with less than 50% diameter stenosis based on ultrasound and Doppler criteria.  LEFT VERTEBRAL ARTERY:  Antegrade flow is noted.  IMPRESSION: Calcified irregular plaque formation is noted in  right carotid bulb and proximal right internal carotid artery consistent with 50-69% stenosis based on ultrasound and Doppler criteria.  Mild plaque formation is noted in the proximal left internal carotid artery consistent with less than 50% diameter stenosis based on ultrasound and Doppler criteria.   Electronically Signed   By: Lupita Raider, M.D.   On: 10/28/2014 10:25     Management plans discussed with the patient, family and they are in agreement.  CODE STATUS:     Code Status Orders        Start     Ordered   10/27/14 2045  Full code   Continuous     10/27/14 2044      TOTAL TIME TAKING CARE OF THIS PATIENT: 40 minutes.    Derreck Wiltsey M.D on 10/28/2014 at 12:51 PM  Between 7am to 6pm - Pager - [phone] After 6pm go to [email or web address] - password [email or web address] [facility]  [facility]  Office  [phone]  CC: Primary care physician; Alan Mulder, MD

## 2014-10-28 NOTE — Evaluation (Signed)
Occupational Therapy Evaluation Patient Details Name: Shannon Wilkerson MRN: [id number] DOB: 1946-04-12 Today's Date: 10/28/2014    History of Present Illness This patient is a 69 year old female who came to [facility] after seeing the eye doctor for possible stroke. She also has COPD. She was not able to tolerate the MRI   Clinical Impression   This patient is a 69 year old ** who came to [facility] with a COPD and possible CVA. Unable to complete MRI.  She lives in a 2 story home with 4 steps to enter with bilateral rails and 11 steps to her bedroom. She had been independent with ADL and functional mobility but is on chronic O2.   She does not show shortness of breath when performing lower body dressing but does fatigue quickly. Instructed patient in energy saving techniques during ADL and cautioned her regarding cooking since she does have a gas stove with the O2. Patient appeared to understand instructions. No further Occupational Therapy needed at this time.       Follow Up Recommendations  No OT follow up    Equipment Recommendations       Recommendations for Other Services       Precautions / Restrictions Precautions Precautions: None Restrictions Weight Bearing Restrictions: No      Mobility Bed Mobility Overal bed mobility: Independent                Transfers                      Balance                                            ADL                                         General ADL Comments: Had been independent with ADL but low endurance     Vision     Perception     Praxis      Pertinent Vitals/Pain       Hand Dominance     Extremity/Trunk Assessment Upper Extremity Assessment Upper Extremity Assessment:  (bilateral hands intact for light touch, sharp, and temp. ROM WFL)           Communication Communication Communication: No difficulties   Cognition Arousal/Alertness:  Awake/alert Behavior During Therapy: WFL for tasks assessed/performed Overall Cognitive Status: Within Functional Limits for tasks assessed                     General Comments       Exercises       Shoulder Instructions      Home Living Family/patient expects to be discharged to:: Private residence Living Arrangements: Spouse/significant other Available Help at Discharge: Family Type of Home: House Home Access: Stairs to enter Technical brewer of Steps: 4 Entrance Stairs-Rails: Can reach both Home Layout: Two level Alternate Level Stairs-Number of Steps: 11 with rail                        Prior Functioning/Environment Level of Independence: Independent        Comments: Uses 2-2.5 liters of O2    OT Diagnosis: Generalized weakness  OT Problem List:  (decreased endurance)   OT Treatment/Interventions:      OT Goals(Current goals can be found in the care plan section) Acute Rehab OT Goals Patient Stated Goal: To go home OT Goal Formulation: With patient Potential to Achieve Goals: Good  OT Frequency:     Barriers to D/C:            Co-evaluation              End of Session Equipment Utilized During Treatment:  (Stroke test kit)  Activity Tolerance: Patient limited by fatigue Patient left: in bed (Fall score is 7)   Time: 0258-5277 OT Time Calculation (min): 24 min Charges:  OT General Charges $OT Visit: 1 Procedure OT Evaluation $Initial OT Evaluation Tier I: 1 Procedure OT Treatments $Self Care/Home Management : 8-22 mins G-Codes: OT G-codes **NOT FOR INPATIENT CLASS** Functional Assessment Tool Used: clinical judgement Functional Limitation: Self care Self Care Current Status (O2423): At least 1 percent but less than 20 percent impaired, limited or restricted Self Care Goal Status (N3614): At least 1 percent but less than 20 percent impaired, limited or restricted Self Care Discharge Status [phone]): At least 1  percent but less than 20 percent impaired, limited or restricted  Myrene Galas, MS/OTR/L  10/28/2014, 1:20 PM

## 2014-10-28 NOTE — Plan of Care (Signed)
MD making rounds. Discharge orders received. IV removed. Discharge paperwork provided, explained, signed and witnessed. No unanswered questions. Discharged via wheelchair with nursing staff. Belongings sent with patient and family.

## 2014-10-28 NOTE — Discharge Instructions (Signed)
Keep Log of BP at home

## 2014-10-28 NOTE — Plan of Care (Signed)
Problem: Acute Rehab OT Goals (only OT should resolve) Goal: OT Additional ADL Goal #1 Patient will understand energy saving techniques today.

## 2014-10-28 NOTE — Progress Notes (Signed)
*  PRELIMINARY RESULTS* Echocardiogram 2D Echocardiogram has been performed.  Georgann HousekeeperJerry R Hege 10/28/2014, 8:43 AM

## 2014-10-28 NOTE — Evaluation (Signed)
Physical Therapy Evaluation Patient Details Name: Shannon Wilkerson MRN: [id number] DOB: [birth year] Today's Date: 10/28/2014   History of Present Illness  Pt is a 69 y.o. female presenting to hospital after eye doctor found multiple retinal hemorrhages (pt with vision changes last few days) and sent pt to ED for possible stroke.  CT head negative for acute infarct or intracranial abnormality.  Pt was not able to tolerate MRI earlier today.  Pt is on chronic home O2 (2 L/min at rest; 2.5 L/min with activity).  Clinical Impression  Currently pt demonstrates impairments with activity tolerance (complicated by pt appearing with increased anxiety from attempted MRI earlier) and limitations with functional mobility.  Prior to admission, pt was independent without AD and is on chronic home O2.  Pt lives with her husband in 2 story home with stairs.  Currently pt is SBA with ambulation but limited d/t SOB and appeared increased anxiety.  Pt would benefit from skilled PT to address above noted impairments and functional limitations.  Recommend pt discharge to home (no further PT needs indicated on discharge from hospital) when medically appropriate.     Follow Up Recommendations No PT follow up    Equipment Recommendations       Recommendations for Other Services       Precautions / Restrictions Precautions Precautions: Fall Restrictions Weight Bearing Restrictions: No      Mobility  Bed Mobility Overal bed mobility: Independent                Transfers Overall transfer level: Independent Equipment used: None                Ambulation/Gait Ambulation/Gait assistance: Supervision Ambulation Distance (Feet): 100 Feet Assistive device: None       General Gait Details: initially pt holding onto objects d/t feeling "anxious" from attempted MRI but as pt appeared more comfortable pt was steady without AD (SBA for supplemental O2 tank); limited distance d/t pt feeling "anxious"  from attempted MRI  Stairs            Wheelchair Mobility    Modified Rankin (Stroke Patients Only)       Balance Overall balance assessment: Independent                               Standardized Balance Assessment Standardized Balance Assessment :  (Tinetti balance assessment:  Pt scored 28/28 indicating pt is at low risk for falls)           Pertinent Vitals/Pain Pain Assessment: No/denies pain  Pre-activity:  BP 150/65; HR 90 bpm; O2 98% on 2 L/min via nasal cannula Post-activity:  BP 166/94; HR 106 bpm; O2 94% on 3 L/min via nasal cannula.    Home Living Family/patient expects to be discharged to:: Private residence Living Arrangements: Spouse/significant other Available Help at Discharge: Family Type of Home: House Home Access: Stairs to enter Entrance Stairs-Rails: Right;Left;Can reach both Entrance Stairs-Number of Steps: 4 Home Layout: Two level Home Equipment: Environmental consultantWalker - 2 wheels;Cane - single point;Shower seat      Prior Function Level of Independence: Independent         Comments: Uses 2-2.5 liters of O2     Hand Dominance        Extremity/Trunk Assessment   Upper Extremity Assessment: Defer to OT evaluation           Lower Extremity Assessment:  (B LE strength and ROM  WFL; light touch intact B LE's; good coordination B heel to shin)         Communication   Communication: No difficulties  Cognition Arousal/Alertness: Awake/alert Behavior During Therapy: WFL for tasks assessed/performed Overall Cognitive Status: Within Functional Limits for tasks assessed                      General Comments  Nursing cleared pt for participation in physical therapy.  Pt agreeable to PT session.  Pt reports "grey area" in R eye was getting better/disappearing.    Exercises        Assessment/Plan    PT Assessment Patient needs continued PT services  PT Diagnosis Difficulty walking   PT Problem List Decreased  activity tolerance;Cardiopulmonary status limiting activity  PT Treatment Interventions DME instruction;Gait training;Stair training;Functional mobility training;Therapeutic activities;Therapeutic exercise;Balance training;Patient/family education   PT Goals (Current goals can be found in the Care Plan section) Acute Rehab PT Goals Patient Stated Goal: To go home PT Goal Formulation: With patient Time For Goal Achievement: 11/11/14 Potential to Achieve Goals: Good    Frequency Min 2X/week   Barriers to discharge        Co-evaluation               End of Session Equipment Utilized During Treatment: Gait belt;Oxygen (3 L/min with activity; 2 L/min at rest) Activity Tolerance: Other (comment) (Limited d/t feeling "anxious" from attempted MRI) Patient left: in bed;with call bell/phone within reach;Other (comment) (with OT present)      Functional Assessment Tool Used: AM-PAC without stairs Functional Limitation: Mobility: Walking and moving around Mobility: Walking and Moving Around Current Status [phone]): At least 1 percent but less than 20 percent impaired, limited or restricted Mobility: Walking and Moving Around Goal Status [phone]): 0 percent impaired, limited or restricted    Time: 1120-1145 PT Time Calculation (min) (ACUTE ONLY): 25 min   Charges:   PT Evaluation $Initial PT Evaluation Tier I: 1 Procedure     PT G Codes:   PT G-Codes **NOT FOR INPATIENT CLASS** Functional Assessment Tool Used: AM-PAC without stairs Functional Limitation: Mobility: Walking and moving around Mobility: Walking and Moving Around Current Status (U9811): At least 1 percent but less than 20 percent impaired, limited or restricted Mobility: Walking and Moving Around Goal Status [phone]): 0 percent impaired, limited or restricted    Hendricks Limes 10/28/2014, 2:22 PM Hendricks Limes, PT [phone]

## 2014-11-22 ENCOUNTER — Other Ambulatory Visit
Admission: RE | Admit: 2014-11-22 | Discharge: 2014-11-22 | Disposition: A | Discharge status: Home / Self Care | Payer: Medicare Other | Source: Ambulatory Visit | Attending: Ophthalmology | Admitting: Ophthalmology

## 2014-11-22 DIAGNOSIS — R51 Headache: Secondary | ICD-10-CM | POA: Diagnosis present

## 2014-11-22 LAB — CBC
HEMATOCRIT: 40.1 % (ref 35.0–47.0)
Hemoglobin: 12.5 g/dL (ref 12.0–16.0)
MCH: 27.3 pg (ref 26.0–34.0)
MCHC: 31.3 g/dL — AB (ref 32.0–36.0)
MCV: 87.3 fL (ref 80.0–100.0)
PLATELETS: 156 10*3/uL (ref 150–440)
RBC: 4.59 MIL/uL (ref 3.80–5.20)
RDW: 14.9 % — AB (ref 11.5–14.5)
WBC: 7.2 10*3/uL (ref 3.6–11.0)

## 2014-11-22 LAB — SEDIMENTATION RATE: Sed Rate: 13 mm/hr (ref 0–30)

## 2014-11-22 LAB — C-REACTIVE PROTEIN: CRP: <0.5 mg/dL (ref <1.0)

## 2014-11-25 ENCOUNTER — Other Ambulatory Visit: Payer: Self-pay | Admitting: Neurology

## 2014-11-25 DIAGNOSIS — G453 Amaurosis fugax: Secondary | ICD-10-CM

## 2014-11-30 ENCOUNTER — Ambulatory Visit
Admission: EM | Admit: 2014-11-30 | Discharge: 2014-11-30 | Disposition: A | Discharge status: Home / Self Care | Payer: Medicare Other | Attending: Family Medicine | Admitting: Family Medicine

## 2014-11-30 ENCOUNTER — Encounter: Payer: Self-pay | Admitting: Emergency Medicine

## 2014-11-30 ENCOUNTER — Ambulatory Visit: Payer: Medicare Other

## 2014-11-30 DIAGNOSIS — Z79899 Other long term (current) drug therapy: Secondary | ICD-10-CM | POA: Diagnosis not present

## 2014-11-30 DIAGNOSIS — I10 Essential (primary) hypertension: Secondary | ICD-10-CM | POA: Diagnosis not present

## 2014-11-30 DIAGNOSIS — M71122 Other infective bursitis, left elbow: Secondary | ICD-10-CM

## 2014-11-30 DIAGNOSIS — B9689 Other specified bacterial agents as the cause of diseases classified elsewhere: Secondary | ICD-10-CM | POA: Diagnosis not present

## 2014-11-30 DIAGNOSIS — F419 Anxiety disorder, unspecified: Secondary | ICD-10-CM | POA: Diagnosis not present

## 2014-11-30 DIAGNOSIS — F17210 Nicotine dependence, cigarettes, uncomplicated: Secondary | ICD-10-CM | POA: Diagnosis not present

## 2014-11-30 DIAGNOSIS — M7022 Olecranon bursitis, left elbow: Secondary | ICD-10-CM

## 2014-11-30 DIAGNOSIS — M25529 Pain in unspecified elbow: Secondary | ICD-10-CM | POA: Diagnosis present

## 2014-11-30 DIAGNOSIS — J449 Chronic obstructive pulmonary disease, unspecified: Secondary | ICD-10-CM | POA: Insufficient documentation

## 2014-11-30 DIAGNOSIS — Z7982 Long term (current) use of aspirin: Secondary | ICD-10-CM | POA: Insufficient documentation

## 2014-11-30 LAB — SYNOVIAL CELL COUNT + DIFF, W/ CRYSTALS
CRYSTALS FLUID: NONE SEEN
Eosinophils-Synovial: 0 % (ref 0–1)
LYMPHOCYTES-SYNOVIAL FLD: 7 % (ref 0–20)
Monocyte-Macrophage-Synovial Fluid: 37 % — ABNORMAL LOW (ref 50–90)
NEUTROPHIL, SYNOVIAL: 56 % — AB (ref 0–25)
OTHER CELLS-SYN: 0
WBC, Synovial: 387 /mm3 — ABNORMAL HIGH (ref 0–200)

## 2014-11-30 MED ORDER — DOXYCYCLINE HYCLATE 100 MG PO CAPS: 100.0000 mg | ORAL_CAPSULE | Freq: Two times a day (BID) | ORAL | Status: DC

## 2014-11-30 NOTE — ED Provider Notes (Signed)
CSN: [id number]     Arrival date & time 11/30/14  1414 History   First MD Initiated Contact with Patient 11/30/14 1500     Chief Complaint  Patient presents with  . Elbow Pain   (Consider location/radiation/quality/duration/timing/severity/associated sxs/prior Treatment) HPI   This is a 69 year old female presents with a 2 day history of left elbow pain and redness over the olecranon. The patient noticed 2 days ago that she's had some pus draining from her elbow she was leaning  on the table. She has COPD and is on supplemental oxygen all the time and has  a tendency of leaning her elbows on hard surface frequently. The swelling has become more noticeable along with redness.She Is able to move her elbow comfortably. She denies any fever or chills.  Past Medical History  Diagnosis Date  . COPD (chronic obstructive pulmonary disease)   . Hypertension   . Shortness of breath dyspnea   . Anxiety    Past Surgical History  Procedure Laterality Date  . Cervical fusion     Family History  Problem Relation Age of Onset  . Stroke Mother   . CAD Mother   . CAD Father   . Alcohol abuse Brother    History  Substance Use Topics  . Smoking status: Light Tobacco Smoker    Types: Cigarettes  . Smokeless tobacco: Never Used  . Alcohol Use: Not on file   OB History    No data available     Review of Systems  Constitutional: Negative for fever, chills and fatigue.  Skin: Positive for color change and wound.  All other systems reviewed and are negative.   Allergies  Fluvirin; Penicillins; and Tetanus toxoids  Home Medications   Prior to Admission medications   Medication Sig Start Date End Date Taking? Authorizing Provider  ALPRAZolam Prudy Feeler) 0.5 MG tablet Take 0.25-0.5 mg by mouth 2 (two) times daily as needed for anxiety.     Historical Provider, MD  amLODipine (NORVASC) 10 MG tablet Take 1 tablet (10 mg total) by mouth daily. 10/28/14   Enedina Finner, MD  Ascorbic Acid (VITAMIN C PO)  Take 1 tablet by mouth daily.    Historical Provider, MD  aspirin 325 MG tablet Take 1 tablet (325 mg total) by mouth daily. 10/28/14   Enedina Finner, MD  diphenhydramine-acetaminophen (TYLENOL PM) 25-500 MG TABS Take 2 tablets by mouth at bedtime as needed (for sleep).    Historical Provider, MD  docusate sodium (COLACE) 100 MG capsule Take 100 mg by mouth at bedtime.    Historical Provider, MD  doxycycline (VIBRAMYCIN) 100 MG capsule Take 1 capsule (100 mg total) by mouth 2 (two) times daily. 11/30/14   Lutricia Feil, PA-C  estrogen, conjugated,-medroxyprogesterone (PREMPRO) 0.625-2.5 MG per tablet Take 1 tablet by mouth 2 (two) times a week. Pt takes on Tuesday and Thursday.    Historical Provider, MD  ipratropium-albuterol (DUONEB) 0.5-2.5 (3) MG/3ML SOLN Take 3 mLs by nebulization every 4 (four) hours as needed (for shortness of breath).    Historical Provider, MD  lamoTRIgine (LAMICTAL) 25 MG tablet Take 25 mg by mouth at bedtime.    Historical Provider, MD  levalbuterol [facility] HFA) 45 MCG/ACT inhaler Inhale 2 puffs into the lungs every 4 (four) hours as needed for wheezing.    Historical Provider, MD  levothyroxine (SYNTHROID, LEVOTHROID) 88 MCG tablet Take 88 mcg by mouth daily.    Historical Provider, MD  montelukast (SINGULAIR) 10 MG tablet Take 10 mg  by mouth at bedtime.    Historical Provider, MD  Multiple Vitamins-Minerals (ZINC PO) Take 1 tablet by mouth daily.    Historical Provider, MD  nystatin (MYCOSTATIN) 100000 UNIT/ML suspension Take 5 mLs by mouth as needed (for thrush).    Historical Provider, MD  OLANZapine (ZYPREXA) 2.5 MG tablet Take 2.5 mg by mouth at bedtime.    Historical Provider, MD  simvastatin (ZOCOR) 20 MG tablet Take 20 mg by mouth at bedtime.    Historical Provider, MD  tiotropium (SPIRIVA) 18 MCG inhalation capsule Place 18 mcg into inhaler and inhale daily.    Historical Provider, MD  venlafaxine XR (EFFEXOR-XR) 150 MG 24 hr capsule Take 150 mg by mouth daily.     Historical Provider, MD  Vitamin D, Ergocalciferol, (DRISDOL) 50000 UNITS CAPS capsule Take 50,000 Units by mouth every 7 (seven) days. Pt takes on Sunday.    Historical Provider, MD  VITAMIN E PO Take 1 tablet by mouth daily.    Historical Provider, MD   BP 157/86 mmHg  Pulse 96  Temp(Src) 98.6 F (37 C) (Tympanic)  Resp 20  Ht 5\' 3"  (1.6 m)  SpO2 97% Physical Exam  Constitutional: She is oriented to person, place, and time. She appears well-developed and well-nourished.  HENT:  Head: Normocephalic and atraumatic.  Eyes: Pupils are equal, round, and reactive to light.  Musculoskeletal:  Examination of the left elbow shows a swollen olecranon that is fluctuant and tender. There is warmth present. Erythema surrounds the olecranon and at the central point has a small punctate type eschar. She has full range of motion of her elbow is comfort at the extremes of flexion. Range of motion is comfortable erythema is extending dorsally into the cubital area and upper distal humerus.  Neurological: She is alert and oriented to person, place, and time.  Skin: Skin is warm and dry. There is erythema.  Psychiatric: She has a normal mood and affect. Her behavior is normal. Judgment and thought content normal.  Nursing note and vitals reviewed.   ED Course  Procedures (including critical care time) Labs Review Labs Reviewed  CULTURE, ROUTINE-ABSCESS  BODY FLUID CULTURE    Imaging Review Dg Elbow Complete Left  11/30/2014   CLINICAL DATA:  Acute left elbow swelling. No known injury. Initial encounter.  EXAM: LEFT ELBOW - COMPLETE 3+ VIEW  COMPARISON:  None.  FINDINGS: There is no evidence of fracture, dislocation, or joint effusion. There is no evidence of arthropathy or other focal bone abnormality. Soft tissue swelling is seen over the olecranon on suggesting cellulitis. No radiopaque foreign body is noted.  IMPRESSION: No fracture or dislocation is noted. Soft tissue swelling is seen over  olecranon on suggesting cellulitis.   Electronically Signed   By: Lupita Raider, M.D.   On: 11/30/2014 15:32   Aspiration of the left elbow olecranon bursa. After thorough prep with Hibiclens and using an aseptic technique ethyl chloride was utilized to freeze the skin overlying the olecranon bursa. 2 mL of plain Xylocaine was then instilled into the skin and with adequate anesthesia aspiration was attempted from distal to proximal but no return was obtained with an 18-gauge needle. For this reason attention was directed over the fluctuant portion of the bursal and again ethyl chloride was utilized to anesthetize the skin and a needle advanced into the olecranon bursa with return of 8 mL of serosanguineous fluid. The fluid was a clear yellow with hemosiderin staining. This was sent to the laboratory for  Gram stain, cell count, cultures and sensitivities and crystal analysis. Pressure was held until bleeding was controlled. At which  time a dry sterile dressing with multiple 4 x 4's Kling and Coban was applied. The patient tolerated the procedure well without any complications.  MDM   1. Septic olecranon bursitis of left elbow    Discharge Medication List as of 11/30/2014  4:10 PM    START taking these medications   Details  doxycycline (VIBRAMYCIN) 100 MG capsule Take 1 capsule (100 mg total) by mouth 2 (two) times daily., Starting 11/30/2014, Until Discontinued, Print       Plan: 1. Test/x-ray results and diagnosis reviewed with patient 2. rx as per orders; risks, benefits, potential side effects reviewed with patient 3. Recommend supportive treatment with elevation /ice. 4. F/u tomorrow either with an orthopaedist or here. Will discuss results of fluid analysis.   I have advised them to go to the Ed for any changes ;increased pain,fever,chills etc.  Lutricia Feil, PA-C 11/30/14 1914

## 2014-11-30 NOTE — ED Notes (Signed)
Pt with a rweddness on left elbow x 2 days

## 2014-11-30 NOTE — Discharge Instructions (Signed)
Bursitis °Bursitis is a swelling and soreness (inflammation) of a fluid-filled sac (bursa) that overlies and protects a joint. It can be caused by injury, overuse of the joint, arthritis or infection. The joints most likely to be affected are the elbows, shoulders, hips and knees. °HOME CARE INSTRUCTIONS  °· Apply ice to the affected area for 15-20 minutes each hour while awake for 2 days. Put the ice in a plastic bag and place a towel between the bag of ice and your skin. °· Rest the injured joint as much as possible, but continue to put the joint through a full range of motion, 4 times per day. (The shoulder joint especially becomes rapidly "frozen" if not used.) When the pain lessens, begin normal slow movements and usual activities. °· Only take over-the-counter or prescription medicines for pain, discomfort or fever as directed by your caregiver. °· Your caregiver may recommend draining the bursa and injecting medicine into the bursa. This may help the healing process. °· Follow all instructions for follow-up with your caregiver. This includes any orthopedic referrals, physical therapy and rehabilitation. Any delay in obtaining necessary care could result in a delay or failure of the bursitis to heal and chronic pain. °SEEK IMMEDIATE MEDICAL CARE IF:  °· Your pain increases even during treatment. °· You develop an oral temperature above 102° F (38.9° C) and have heat and inflammation over the involved bursa. °MAKE SURE YOU:  °· Understand these instructions. °· Will watch your condition. °· Will get help right away if you are not doing well or get worse. °Document Released: 04/06/2000 Document Revised: 07/02/2011 Document Reviewed: 06/29/2013 °Exit[employer]® Patient Information ©2015 [employer]. This information is not intended to replace advice given to you by your health care provider. Make sure you discuss any questions you have with your health care provider. ° ° ° °Cellulitis °Cellulitis is an infection of  the skin and the tissue beneath it. The infected area is usually red and tender. Cellulitis occurs most often in the arms and lower legs.  °CAUSES  °Cellulitis is caused by bacteria that enter the skin through cracks or cuts in the skin. The most common types of bacteria that cause cellulitis are staphylococci and streptococci. °SIGNS AND SYMPTOMS  °· Redness and warmth. °· Swelling. °· Tenderness or pain. °· Fever. °DIAGNOSIS  °Your health care provider can usually determine what is wrong based on a physical exam. Blood tests may also be done. °TREATMENT  °Treatment usually involves taking an antibiotic medicine. °HOME CARE INSTRUCTIONS  °· Take your antibiotic medicine as directed by your health care provider. Finish the antibiotic even if you start to feel better. °· Keep the infected arm or leg elevated to reduce swelling. °· Apply a warm cloth to the affected area up to 4 times per day to relieve pain. °· Take medicines only as directed by your health care provider. °· Keep all follow-up visits as directed by your health care provider. °SEEK MEDICAL CARE IF:  °· You notice red streaks coming from the infected area. °· Your red area gets larger or turns dark in color. °· Your bone or joint underneath the infected area becomes painful after the skin has healed. °· Your infection returns in the same area or another area. °· You notice a swollen bump in the infected area. °· You develop new symptoms. °· You have a fever. °SEEK IMMEDIATE MEDICAL CARE IF:  °· You feel very sleepy. °· You develop vomiting or diarrhea. °· You have a   general ill feeling (malaise) with muscle aches and pains. °MAKE SURE YOU:  °· Understand these instructions. °· Will watch your condition. °· Will get help right away if you are not doing well or get worse. °Document Released: 01/17/2005 Document Revised: 08/24/2013 Document Reviewed: 06/25/2011 °[employer]® Patient Information ©2015 [employer]. This information is not intended to replace  advice given to you by your health care provider. Make sure you discuss any questions you have with your health care provider. ° °

## 2014-12-01 ENCOUNTER — Ambulatory Visit
Admission: EM | Admit: 2014-12-01 | Discharge: 2014-12-01 | Disposition: A | Discharge status: Home / Self Care | Payer: Medicare Other | Attending: Family Medicine | Admitting: Family Medicine

## 2014-12-01 DIAGNOSIS — B9689 Other specified bacterial agents as the cause of diseases classified elsewhere: Secondary | ICD-10-CM | POA: Diagnosis not present

## 2014-12-01 DIAGNOSIS — M7022 Olecranon bursitis, left elbow: Secondary | ICD-10-CM

## 2014-12-01 DIAGNOSIS — M71122 Other infective bursitis, left elbow: Secondary | ICD-10-CM

## 2014-12-01 NOTE — ED Provider Notes (Signed)
CSN: [id number]     Arrival date & time 12/01/14  1124 History   First MD Initiated Contact with Patient 12/01/14 1153     Chief Complaint  Patient presents with  . Wound Check   (Consider location/radiation/quality/duration/timing/severity/associated sxs/prior Treatment) HPI   Patient returns today as requested during that she has noticed improvement in her elbow. She is taken 3 total doses of doxycycline with noticeable decrease in intensity and size of her erythema. The bursal sac continues to remain slightly distended and warm. She is afebrile.   Past Medical History  Diagnosis Date  . COPD (chronic obstructive pulmonary disease)   . Hypertension   . Shortness of breath dyspnea   . Anxiety    Past Surgical History  Procedure Laterality Date  . Cervical fusion     Family History  Problem Relation Age of Onset  . Stroke Mother   . CAD Mother   . CAD Father   . Alcohol abuse Brother    Social History  Substance Use Topics  . Smoking status: Light Tobacco Smoker    Types: Cigarettes  . Smokeless tobacco: Never Used  . Alcohol Use: Yes     Comment: occasionally   OB History    No data available     Review of Systems  Constitutional: Positive for activity change. Negative for fever and chills.    Allergies  Fluvirin; Penicillins; and Tetanus toxoids  Home Medications   Prior to Admission medications   Medication Sig Start Date End Date Taking? Authorizing Provider  ALPRAZolam Prudy Feeler) 0.5 MG tablet Take 0.25-0.5 mg by mouth 2 (two) times daily as needed for anxiety.    Yes Historical Provider, MD  Ascorbic Acid (VITAMIN C PO) Take 1 tablet by mouth daily.   Yes Historical Provider, MD  aspirin 325 MG tablet Take 1 tablet (325 mg total) by mouth daily. 10/28/14  Yes Enedina Finner, MD  diphenhydramine-acetaminophen (TYLENOL PM) 25-500 MG TABS Take 2 tablets by mouth at bedtime as needed (for sleep).   Yes Historical Provider, MD  docusate sodium (COLACE) 100 MG capsule  Take 100 mg by mouth at bedtime.   Yes Historical Provider, MD  doxycycline (VIBRAMYCIN) 100 MG capsule Take 1 capsule (100 mg total) by mouth 2 (two) times daily. 11/30/14  Yes Lutricia Feil, PA-C  ipratropium-albuterol (DUONEB) 0.5-2.5 (3) MG/3ML SOLN Take 3 mLs by nebulization every 4 (four) hours as needed (for shortness of breath).   Yes Historical Provider, MD  lamoTRIgine (LAMICTAL) 25 MG tablet Take 25 mg by mouth at bedtime.   Yes Historical Provider, MD  levalbuterol [facility] HFA) 45 MCG/ACT inhaler Inhale 2 puffs into the lungs every 4 (four) hours as needed for wheezing.   Yes Historical Provider, MD  levothyroxine (SYNTHROID, LEVOTHROID) 88 MCG tablet Take 88 mcg by mouth daily.   Yes Historical Provider, MD  montelukast (SINGULAIR) 10 MG tablet Take 10 mg by mouth at bedtime.   Yes Historical Provider, MD  Multiple Vitamins-Minerals (ZINC PO) Take 1 tablet by mouth daily.   Yes Historical Provider, MD  OLANZapine (ZYPREXA) 2.5 MG tablet Take 2.5 mg by mouth at bedtime.   Yes Historical Provider, MD  simvastatin (ZOCOR) 20 MG tablet Take 20 mg by mouth at bedtime.   Yes Historical Provider, MD  tiotropium (SPIRIVA) 18 MCG inhalation capsule Place 18 mcg into inhaler and inhale daily.   Yes Historical Provider, MD  venlafaxine XR (EFFEXOR-XR) 150 MG 24 hr capsule Take 150 mg by mouth  daily.   Yes Historical Provider, MD  Vitamin D, Ergocalciferol, (DRISDOL) 50000 UNITS CAPS capsule Take 50,000 Units by mouth every 7 (seven) days. Pt takes on Sunday.   Yes Historical Provider, MD  VITAMIN E PO Take 1 tablet by mouth daily.   Yes Historical Provider, MD  amLODipine (NORVASC) 10 MG tablet Take 1 tablet (10 mg total) by mouth daily. 10/28/14   Enedina Finner, MD  estrogen, conjugated,-medroxyprogesterone (PREMPRO) 0.625-2.5 MG per tablet Take 1 tablet by mouth 2 (two) times a week. Pt takes on Tuesday and Thursday.    Historical Provider, MD  nystatin (MYCOSTATIN) 100000 UNIT/ML suspension Take 5  mLs by mouth as needed (for thrush).    Historical Provider, MD   BP 166/74 mmHg  Pulse 105  Temp(Src) 98 F (36.7 C) (Tympanic)  Resp 18  Ht 5' 2.5" (1.588 m)  Wt 153 lb (69.4 kg)  BMI 27.52 kg/m2  SpO2 95% Physical Exam  Constitutional: She is oriented to person, place, and time. She appears well-developed and well-nourished.  HENT:  Head: Normocephalic and atraumatic.  Eyes: Pupils are equal, round, and reactive to light.  Musculoskeletal:  Examination of the elbow shows full range of motion flexion extension pronation and supination. Comfortable. The erythema yesterday that was rather intense is now subsided quite a bit and is nearly faded and has receded to outline marker area that was placed today. The olecranon bursal sac remains decompressed but has a small amount of fluctuance deep. The elbow is warm but not as bad as yesterday.  Neurological: She is alert and oriented to person, place, and time.  Skin: Skin is warm and dry. There is erythema.  Psychiatric: She has a normal mood and affect. Her behavior is normal. Judgment and thought content normal.  Nursing note and vitals reviewed.   ED Course  Procedures (including critical care time) Labs Review Labs Reviewed - No data to display  Imaging Review Dg Elbow Complete Left  11/30/2014   CLINICAL DATA:  Acute left elbow swelling. No known injury. Initial encounter.  EXAM: LEFT ELBOW - COMPLETE 3+ VIEW  COMPARISON:  None.  FINDINGS: There is no evidence of fracture, dislocation, or joint effusion. There is no evidence of arthropathy or other focal bone abnormality. Soft tissue swelling is seen over the olecranon on suggesting cellulitis. No radiopaque foreign body is noted.  IMPRESSION: No fracture or dislocation is noted. Soft tissue swelling is seen over olecranon on suggesting cellulitis.   Electronically Signed   By: Lupita Raider, M.D.   On: 11/30/2014 15:32     MDM   1. Septic olecranon bursitis of left elbow    I  outlined the area of erythema demarcation  today which is much less than yesterday. Remains afebrile today and continues to have full range of motion that is comfortable. I told her that she can remove the dressings applied today in order to shower. He may use regular soap. I've cautioned her regarding pressure on the olecranon and to continue taking her doxycycline as ordered. We will see her in follow-up in 2 days and if she is progressing well likely return her to her primary care physician. Results of the aspiration cell count is explained to her today. The cultures and sensitivities should be available tomorrow. I will call her with those results.    Lutricia Feil, PA-C 12/01/14 1220

## 2014-12-01 NOTE — ED Notes (Signed)
Here for wound recheck left elbow-drained yesterday

## 2014-12-02 ENCOUNTER — Ambulatory Visit: Payer: Medicare Other

## 2014-12-04 LAB — BODY FLUID CULTURE
Culture: NO GROWTH
Special Requests: NORMAL

## 2014-12-07 ENCOUNTER — Ambulatory Visit
Admission: RE | Admit: 2014-12-07 | Discharge: 2014-12-07 | Disposition: A | Discharge status: Home / Self Care | Payer: Medicare Other | Source: Ambulatory Visit | Attending: Neurology | Admitting: Neurology

## 2014-12-07 DIAGNOSIS — G453 Amaurosis fugax: Secondary | ICD-10-CM | POA: Diagnosis present

## 2014-12-07 HISTORY — DX: Unspecified asthma, uncomplicated: J45.909

## 2014-12-07 MED ORDER — IOHEXOL 350 MG/ML SOLN
100.0000 mL | Freq: Once | INTRAVENOUS | Status: AC | PRN
  Administered 2014-12-07: 80 mL via INTRAVENOUS

## 2014-12-16 ENCOUNTER — Other Ambulatory Visit: Payer: Self-pay | Admitting: Neurology

## 2015-01-11 ENCOUNTER — Encounter: Payer: Self-pay | Admitting: *Deleted

## 2015-01-14 NOTE — Discharge Instructions (Signed)

## 2015-01-17 ENCOUNTER — Ambulatory Visit
Admission: RE | Admit: 2015-01-17 | Discharge: 2015-01-17 | Disposition: A | Discharge status: Home / Self Care | Payer: Medicare Other | Source: Ambulatory Visit | Attending: Ophthalmology | Admitting: Ophthalmology

## 2015-01-17 ENCOUNTER — Encounter: Payer: Self-pay | Admitting: Ophthalmology

## 2015-01-17 ENCOUNTER — Ambulatory Visit: Payer: Medicare Other | Admitting: Student in an Organized Health Care Education/Training Program

## 2015-01-17 ENCOUNTER — Encounter
Admission: RE | Disposition: A | Discharge status: Home / Self Care | Payer: Self-pay | Source: Ambulatory Visit | Attending: Ophthalmology

## 2015-01-17 DIAGNOSIS — Z888 Allergy status to other drugs, medicaments and biological substances status: Secondary | ICD-10-CM | POA: Insufficient documentation

## 2015-01-17 DIAGNOSIS — Z9981 Dependence on supplemental oxygen: Secondary | ICD-10-CM | POA: Insufficient documentation

## 2015-01-17 DIAGNOSIS — Z7982 Long term (current) use of aspirin: Secondary | ICD-10-CM | POA: Diagnosis not present

## 2015-01-17 DIAGNOSIS — Z887 Allergy status to serum and vaccine status: Secondary | ICD-10-CM | POA: Insufficient documentation

## 2015-01-17 DIAGNOSIS — J449 Chronic obstructive pulmonary disease, unspecified: Secondary | ICD-10-CM | POA: Insufficient documentation

## 2015-01-17 DIAGNOSIS — H2512 Age-related nuclear cataract, left eye: Secondary | ICD-10-CM | POA: Diagnosis present

## 2015-01-17 DIAGNOSIS — I10 Essential (primary) hypertension: Secondary | ICD-10-CM | POA: Insufficient documentation

## 2015-01-17 DIAGNOSIS — Z880 Allergy status to penicillin: Secondary | ICD-10-CM | POA: Diagnosis not present

## 2015-01-17 DIAGNOSIS — F17210 Nicotine dependence, cigarettes, uncomplicated: Secondary | ICD-10-CM | POA: Diagnosis not present

## 2015-01-17 HISTORY — DX: Torticollis: M43.6

## 2015-01-17 HISTORY — DX: Anemia, unspecified: D64.9

## 2015-01-17 HISTORY — DX: Transient cerebral ischemic attack, unspecified: G45.9

## 2015-01-17 HISTORY — PX: CATARACT EXTRACTION W/PHACO: SHX586

## 2015-01-17 SURGERY — PHACOEMULSIFICATION, CATARACT, WITH IOL INSERTION
Anesthesia: Monitor Anesthesia Care | Site: Eye | Laterality: Left | Wound class: Clean

## 2015-01-17 MED ORDER — BRIMONIDINE TARTRATE 0.2 % OP SOLN
OPHTHALMIC | Status: DC | PRN
  Administered 2015-01-17: 1 [drp] via OPHTHALMIC

## 2015-01-17 MED ORDER — FENTANYL CITRATE (PF) 100 MCG/2ML IJ SOLN
INTRAMUSCULAR | Status: DC | PRN
  Administered 2015-01-17: 50 ug via INTRAVENOUS

## 2015-01-17 MED ORDER — EPINEPHRINE HCL 1 MG/ML IJ SOLN
INTRAOCULAR | Status: DC | PRN
  Administered 2015-01-17: 80 mL via OPHTHALMIC

## 2015-01-17 MED ORDER — LACTATED RINGERS IV SOLN: INTRAVENOUS | Status: DC

## 2015-01-17 MED ORDER — NA HYALUR & NA CHOND-NA HYALUR 0.55-0.5 ML IO KIT
PACK | INTRAOCULAR | Status: DC | PRN
  Administered 2015-01-17: 1 via INTRAOCULAR

## 2015-01-17 MED ORDER — TIMOLOL MALEATE 0.5 % OP SOLN
OPHTHALMIC | Status: DC | PRN
  Administered 2015-01-17: 1 [drp]

## 2015-01-17 MED ORDER — POVIDONE-IODINE 5 % OP SOLN
1.0000 "application " | OPHTHALMIC | Status: DC | PRN
  Administered 2015-01-17: 1 via OPHTHALMIC

## 2015-01-17 MED ORDER — LIDOCAINE HCL (PF) 4 % IJ SOLN
INTRAOCULAR | Status: DC | PRN
  Administered 2015-01-17: 1 mL via OPHTHALMIC

## 2015-01-17 MED ORDER — MOXIFLOXACIN HCL 0.5 % OP SOLN
OPHTHALMIC | Status: DC | PRN
  Administered 2015-01-17: .1 [drp] via OPHTHALMIC

## 2015-01-17 MED ORDER — MIDAZOLAM HCL 2 MG/2ML IJ SOLN
INTRAMUSCULAR | Status: DC | PRN
  Administered 2015-01-17 (×2): 1 mg via INTRAVENOUS

## 2015-01-17 MED ORDER — PROPARACAINE HCL 0.5 % OP SOLN
1.0000 [drp] | Freq: Once | OPHTHALMIC | Status: AC
  Administered 2015-01-17: 1 [drp] via OPHTHALMIC

## 2015-01-17 MED ORDER — ARMC OPHTHALMIC DILATING GEL
1.0000 "application " | OPHTHALMIC | Status: DC | PRN
  Administered 2015-01-17 (×2): 1 via OPHTHALMIC

## 2015-01-17 SURGICAL SUPPLY — 29 items
APPLICATOR COTTON TIP 3IN (MISCELLANEOUS) ×3 IMPLANT
CANNULA ANT/CHMB 27GA (MISCELLANEOUS) ×3 IMPLANT
DISSECTOR HYDRO NUCLEUS 50X22 (MISCELLANEOUS) ×3 IMPLANT
GLOVE BIO SURGEON STRL SZ7 (GLOVE) ×3 IMPLANT
GLOVE SURG LX 6.5 MICRO (GLOVE) ×2
GLOVE SURG LX STRL 6.5 MICRO (GLOVE) ×1 IMPLANT
GOWN STRL REUS W/ TWL LRG LVL3 (GOWN DISPOSABLE) ×2 IMPLANT
GOWN STRL REUS W/TWL LRG LVL3 (GOWN DISPOSABLE) ×4
LENS IOL ACRSF IQ PC 22.5 (Intraocular Lens) ×1 IMPLANT
LENS IOL ACRYSOF IQ POST 22.5 (Intraocular Lens) ×3 IMPLANT
MARKER SKIN SURG W/RULER VIO (MISCELLANEOUS) ×3 IMPLANT
NEEDLE FILTER BLUNT 18X 1/2SAF (NEEDLE) ×2
NEEDLE FILTER BLUNT 18X1 1/2 (NEEDLE) ×1 IMPLANT
PACK CATARACT BRASINGTON (MISCELLANEOUS) ×3 IMPLANT
PACK EYE AFTER SURG (MISCELLANEOUS) ×3 IMPLANT
PACK OPTHALMIC (MISCELLANEOUS) ×3 IMPLANT
RING MALYGIN 7.0 (MISCELLANEOUS) IMPLANT
SOL BAL SALT 15ML (MISCELLANEOUS)
SOLUTION BAL SALT 15ML (MISCELLANEOUS) IMPLANT
SUT ETHILON 10-0 CS-B-6CS-B-6 (SUTURE)
SUT VICRYL  9 0 (SUTURE)
SUT VICRYL 9 0 (SUTURE) IMPLANT
SUTURE EHLN 10-0 CS-B-6CS-B-6 (SUTURE) IMPLANT
SYR 3ML LL SCALE MARK (SYRINGE) ×6 IMPLANT
SYR TB 1ML LUER SLIP (SYRINGE) ×3 IMPLANT
WATER STERILE IRR 250ML POUR (IV SOLUTION) ×3 IMPLANT
WATER STERILE IRR 500ML POUR (IV SOLUTION) IMPLANT
WICK EYE OCUCEL (MISCELLANEOUS) IMPLANT
WIPE NON LINTING 3.25X3.25 (MISCELLANEOUS) ×3 IMPLANT

## 2015-01-17 NOTE — Op Note (Signed)
Date of Surgery: 01/17/2015  PREOPERATIVE DIAGNOSES: Visually significant nuclear sclerotic cataract, left eye.  POSTOPERATIVE DIAGNOSES: Same  PROCEDURES PERFORMED: Cataract extraction with intraocular lens implant, left eye.  SURGEON: Devin Going, M.D.  ANESTHESIA: MAC and topical  IMPLANTS: AcrySof IQ [id number] +22.5   Implant Name Type Inv. Item Serial No. Manufacturer Lot No. LRB No. Used  IMPLANT LENS - [id number] Intraocular Lens IMPLANT LENS [id number] ALCON   Left 1    COMPLICATIONS: None.  DESCRIPTION OF PROCEDURE: Therapeutic options were discussed with the patient preoperatively, including a discussion of risks and benefits of surgery. Informed consent was obtained. An IOL-Master and immersion biometry were used to take the lens measurements, and a dilated fundus exam was performed within 6 months of the surgical date.  The patient was premedicated and brought to the operating room and placed on the operating table in the supine position. After adequate anesthesia, the patient was prepped and draped in the usual sterile ophthalmic fashion. A wire lid speculum was inserted and the microscope was positioned. A Superblade was used to create a paracentesis site at the limbus and a small amount of dilute preservative free lidocaine was instilled into the anterior chamber, followed by dispersive viscoelastic. A clear corneal incision was created temporally using a 2.4 mm keratome blade. Capsulorrhexis was then performed. In situ phacoemulsification was performed.  Cortical material was removed with the irrigation-aspiration unit. Dispersive viscoelastic was instilled to open the capsular bag. A posterior chamber intraocular lens with the specifications above was inserted and positioned. Irrigation-aspiration was used to remove all viscoelastic. Vigamox 1cc was instilled into the anterior chamber, and the corneal incision was checked and found to be water tight. The eyelid speculum was  removed.  The operative eye was covered with protective goggles after instilling 1 drop of timolol and brimonidine. The patient tolerated the procedure well. There were no complications.

## 2015-01-17 NOTE — H&P (Signed)
H+P reviewed and is up to date, please see paper chart.  

## 2015-01-17 NOTE — Anesthesia Preprocedure Evaluation (Signed)
Anesthesia Evaluation  Patient identified by MRN, date of birth, ID band  Airway Mallampati: II  TM Distance: >3 FB     Dental   Pulmonary COPD,  COPD inhaler and oxygen dependent, Current Smoker,           Cardiovascular hypertension,      Neuro/Psych    GI/Hepatic   Endo/Other    Renal/GU      Musculoskeletal   Abdominal   Peds  Hematology   Anesthesia Other Findings   Reproductive/Obstetrics                             Anesthesia Physical Anesthesia Plan  ASA: III  Anesthesia Plan: MAC   Post-op Pain Management:    Induction: Intravenous  Airway Management Planned:   Additional Equipment:   Intra-op Plan:   Post-operative Plan:   Informed Consent: I have reviewed the patients History and Physical, chart, labs and discussed the procedure including the risks, benefits and alternatives for the proposed anesthesia with the patient or authorized representative who has indicated his/her understanding and acceptance.     Plan Discussed with: CRNA  Anesthesia Plan Comments:         Anesthesia Quick Evaluation

## 2015-01-17 NOTE — Anesthesia Postprocedure Evaluation (Signed)
  Anesthesia Post-op Note  Patient: Shannon Wilkerson  Procedure(s) Performed: Procedure(s): CATARACT EXTRACTION PHACO AND INTRAOCULAR LENS PLACEMENT (IOC) (Left)  Anesthesia type:MAC  Patient location: PACU  Post pain: Pain level controlled  Post assessment: Post-op Vital signs reviewed, Patient's Cardiovascular Status Stable, Respiratory Function Stable, Patent Airway and No signs of Nausea or vomiting  Post vital signs: Reviewed and stable  Last Vitals:  Filed Vitals:   01/17/15 0959  BP: 168/82  Pulse: 86  Temp:   Resp: 17    Level of consciousness: awake, alert  and patient cooperative  Complications: No apparent anesthesia complications

## 2015-01-17 NOTE — Transfer of Care (Signed)
Immediate Anesthesia Transfer of Care Note  Patient: Shannon Wilkerson  Procedure(s) Performed: Procedure(s): CATARACT EXTRACTION PHACO AND INTRAOCULAR LENS PLACEMENT (IOC) (Left)  Patient Location: PACU  Anesthesia Type: MAC  Level of Consciousness: awake, alert  and patient cooperative  Airway and Oxygen Therapy: Patient Spontanous Breathing and Patient connected to supplemental oxygen  Post-op Assessment: Post-op Vital signs reviewed, Patient's Cardiovascular Status Stable, Respiratory Function Stable, Patent Airway and No signs of Nausea or vomiting  Post-op Vital Signs: Reviewed and stable  Complications: No apparent anesthesia complications

## 2015-01-17 NOTE — Anesthesia Procedure Notes (Signed)
Procedure Name: MAC Performed by: BUSH, WENDY Pre-anesthesia Checklist: Patient identified, Emergency Drugs available, Suction available, Timeout performed and Patient being monitored Patient Re-evaluated:Patient Re-evaluated prior to inductionOxygen Delivery Method: Nasal cannula Placement Confirmation: positive ETCO2     

## 2015-01-18 LAB — CULTURE, ROUTINE-ABSCESS: Special Requests: NORMAL

## 2015-01-31 ENCOUNTER — Other Ambulatory Visit: Payer: Self-pay | Admitting: Endocrinology

## 2015-01-31 DIAGNOSIS — I10 Essential (primary) hypertension: Secondary | ICD-10-CM

## 2015-02-04 ENCOUNTER — Ambulatory Visit: Payer: Medicare Other

## 2015-02-09 ENCOUNTER — Ambulatory Visit
Admission: RE | Admit: 2015-02-09 | Discharge: 2015-02-09 | Disposition: A | Discharge status: Home / Self Care | Payer: Medicare Other | Source: Ambulatory Visit | Attending: Endocrinology | Admitting: Endocrinology

## 2015-02-09 DIAGNOSIS — I10 Essential (primary) hypertension: Secondary | ICD-10-CM | POA: Insufficient documentation

## 2015-02-09 NOTE — Progress Notes (Signed)
*  PRELIMINARY RESULTS* Echocardiogram 2D Echocardiogram has been performed.  Georgann HousekeeperJerry R Hege 02/09/2015, 12:53 PM

## 2015-02-28 ENCOUNTER — Encounter: Payer: Self-pay | Admitting: Emergency Medicine

## 2015-02-28 ENCOUNTER — Inpatient Hospital Stay
Admission: EM | Admit: 2015-02-28 | Discharge: 2015-03-02 | DRG: 065 | Disposition: A | Discharge status: Home / Self Care | Payer: Medicare Other | Attending: Internal Medicine | Admitting: Internal Medicine

## 2015-02-28 ENCOUNTER — Emergency Department: Payer: Medicare Other

## 2015-02-28 DIAGNOSIS — I639 Cerebral infarction, unspecified: Secondary | ICD-10-CM | POA: Diagnosis not present

## 2015-02-28 DIAGNOSIS — F17210 Nicotine dependence, cigarettes, uncomplicated: Secondary | ICD-10-CM | POA: Diagnosis present

## 2015-02-28 DIAGNOSIS — I10 Essential (primary) hypertension: Secondary | ICD-10-CM | POA: Diagnosis present

## 2015-02-28 DIAGNOSIS — Z8673 Personal history of transient ischemic attack (TIA), and cerebral infarction without residual deficits: Secondary | ICD-10-CM

## 2015-02-28 DIAGNOSIS — Z9981 Dependence on supplemental oxygen: Secondary | ICD-10-CM

## 2015-02-28 DIAGNOSIS — J45909 Unspecified asthma, uncomplicated: Secondary | ICD-10-CM | POA: Diagnosis present

## 2015-02-28 DIAGNOSIS — Z981 Arthrodesis status: Secondary | ICD-10-CM | POA: Diagnosis not present

## 2015-02-28 DIAGNOSIS — F411 Generalized anxiety disorder: Secondary | ICD-10-CM | POA: Diagnosis present

## 2015-02-28 DIAGNOSIS — H53462 Homonymous bilateral field defects, left side: Secondary | ICD-10-CM | POA: Diagnosis present

## 2015-02-28 DIAGNOSIS — Z880 Allergy status to penicillin: Secondary | ICD-10-CM

## 2015-02-28 DIAGNOSIS — E039 Hypothyroidism, unspecified: Secondary | ICD-10-CM | POA: Diagnosis present

## 2015-02-28 DIAGNOSIS — G459 Transient cerebral ischemic attack, unspecified: Secondary | ICD-10-CM

## 2015-02-28 DIAGNOSIS — F329 Major depressive disorder, single episode, unspecified: Secondary | ICD-10-CM

## 2015-02-28 DIAGNOSIS — Z887 Allergy status to serum and vaccine status: Secondary | ICD-10-CM | POA: Diagnosis not present

## 2015-02-28 DIAGNOSIS — J9611 Chronic respiratory failure with hypoxia: Secondary | ICD-10-CM | POA: Diagnosis present

## 2015-02-28 DIAGNOSIS — I63431 Cerebral infarction due to embolism of right posterior cerebral artery: Secondary | ICD-10-CM | POA: Diagnosis not present

## 2015-02-28 DIAGNOSIS — F410 Panic disorder [episodic paroxysmal anxiety] without agoraphobia: Secondary | ICD-10-CM | POA: Diagnosis present

## 2015-02-28 DIAGNOSIS — Z7982 Long term (current) use of aspirin: Secondary | ICD-10-CM | POA: Diagnosis not present

## 2015-02-28 DIAGNOSIS — Z7901 Long term (current) use of anticoagulants: Secondary | ICD-10-CM

## 2015-02-28 DIAGNOSIS — J441 Chronic obstructive pulmonary disease with (acute) exacerbation: Secondary | ICD-10-CM | POA: Diagnosis present

## 2015-02-28 DIAGNOSIS — F331 Major depressive disorder, recurrent, moderate: Secondary | ICD-10-CM | POA: Diagnosis not present

## 2015-02-28 LAB — COMPREHENSIVE METABOLIC PANEL
ALT: 13 U/L — ABNORMAL LOW (ref 14–54)
AST: 17 U/L (ref 15–41)
Albumin: 4 g/dL (ref 3.5–5.0)
Alkaline Phosphatase: 57 U/L (ref 38–126)
Anion gap: 8 (ref 5–15)
BUN: 9 mg/dL (ref 6–20)
CHLORIDE: 94 mmol/L — AB (ref 101–111)
CO2: 40 mmol/L — AB (ref 22–32)
Calcium: 9.5 mg/dL (ref 8.9–10.3)
Creatinine, Ser: 0.39 mg/dL — ABNORMAL LOW (ref 0.44–1.00)
Glucose, Bld: 86 mg/dL (ref 65–99)
POTASSIUM: 4.5 mmol/L (ref 3.5–5.1)
SODIUM: 142 mmol/L (ref 135–145)
Total Bilirubin: 0.4 mg/dL (ref 0.3–1.2)
Total Protein: 7 g/dL (ref 6.5–8.1)

## 2015-02-28 LAB — CBC
HEMATOCRIT: 37.6 % (ref 35.0–47.0)
Hemoglobin: 12 g/dL (ref 12.0–16.0)
MCH: 27.5 pg (ref 26.0–34.0)
MCHC: 32 g/dL (ref 32.0–36.0)
MCV: 85.9 fL (ref 80.0–100.0)
PLATELETS: 146 10*3/uL — AB (ref 150–440)
RBC: 4.37 MIL/uL (ref 3.80–5.20)
RDW: 14.6 % — AB (ref 11.5–14.5)
WBC: 5.3 10*3/uL (ref 3.6–11.0)

## 2015-02-28 LAB — DIFFERENTIAL
BASOS ABS: 0 10*3/uL (ref 0–0.1)
BASOS PCT: 0 %
EOS ABS: 0 10*3/uL (ref 0–0.7)
Eosinophils Relative: 1 %
Lymphocytes Relative: 23 %
Lymphs Abs: 1.2 10*3/uL (ref 1.0–3.6)
MONO ABS: 0.4 10*3/uL (ref 0.2–0.9)
MONOS PCT: 8 %
NEUTROS ABS: 3.6 10*3/uL (ref 1.4–6.5)
Neutrophils Relative %: 68 %

## 2015-02-28 LAB — PROTIME-INR
INR: 0.91
Prothrombin Time: 12.5 seconds (ref 11.4–15.0)

## 2015-02-28 LAB — GLUCOSE, CAPILLARY: GLUCOSE-CAPILLARY: 84 mg/dL (ref 65–99)

## 2015-02-28 LAB — APTT: APTT: 30 s (ref 24–36)

## 2015-02-28 MED ORDER — STROKE: EARLY STAGES OF RECOVERY BOOK
Freq: Once | Status: AC
  Administered 2015-02-28: 22:00:00

## 2015-02-28 MED ORDER — ENOXAPARIN SODIUM 40 MG/0.4ML ~~LOC~~ SOLN
40.0000 mg | SUBCUTANEOUS | Status: DC
  Administered 2015-02-28 – 2015-03-01 (×2): 40 mg via SUBCUTANEOUS
  Filled 2015-02-28 (×2): qty 0.4

## 2015-02-28 MED ORDER — CLOPIDOGREL BISULFATE 75 MG PO TABS: 75.0000 mg | ORAL_TABLET | Freq: Every day | ORAL | Status: DC

## 2015-02-28 MED ORDER — NICOTINE 14 MG/24HR TD PT24
14.0000 mg | MEDICATED_PATCH | Freq: Every day | TRANSDERMAL | Status: DC
  Administered 2015-03-01 – 2015-03-02 (×2): 14 mg via TRANSDERMAL
  Filled 2015-02-28 (×3): qty 1

## 2015-02-28 MED ORDER — LEVALBUTEROL TARTRATE 45 MCG/ACT IN AERO: 2.0000 | INHALATION_SPRAY | Freq: Four times a day (QID) | RESPIRATORY_TRACT | Status: DC | PRN

## 2015-02-28 MED ORDER — ALBUTEROL SULFATE (2.5 MG/3ML) 0.083% IN NEBU
2.5000 mg | INHALATION_SOLUTION | RESPIRATORY_TRACT | Status: DC | PRN
Start: 2015-02-28 — End: 2015-03-01

## 2015-02-28 MED ORDER — ALPRAZOLAM 0.5 MG PO TABS
0.5000 mg | ORAL_TABLET | Freq: Two times a day (BID) | ORAL | Status: DC | PRN
  Administered 2015-02-28: 23:00:00 0.5 mg via ORAL
  Filled 2015-02-28: qty 1

## 2015-02-28 MED ORDER — ASPIRIN 325 MG PO TABS
325.0000 mg | ORAL_TABLET | Freq: Every day | ORAL | Status: DC
  Administered 2015-02-28: 23:00:00 325 mg via ORAL
  Filled 2015-02-28: qty 1

## 2015-02-28 MED ORDER — ASPIRIN 300 MG RE SUPP: 300.0000 mg | Freq: Every day | RECTAL | Status: DC

## 2015-02-28 NOTE — ED Provider Notes (Addendum)
[facility] Emergency Department Provider Note  ____________________________________________  Time seen: Seen upon arrival to the treatment area  I have reviewed the triage vital signs and the nursing notes.   HISTORY  Chief Complaint Code Stroke    HPI Shannon Wilkerson is a 69 y.o. female with a history of TIA in the past who is presenting today with vision loss to her left eye. She says that the vision loss began last night with light sensitivity and flashing lights to the left eye. She says that it improved over the night but then worsened again at 2 PM today. She was seen by her ophthalmologist, Dr. Inez Pilgrim, who sent her to the emergency department for evaluation for stroke. He diagnosed a homonymous left hemianopia. The patient took her aspirin and Plavix last at 10 AM this morning. Denies any weakness or numbness.She says that there appears to be a wavy line across the bottom of the eye where she only sees grey below it.   Past Medical History  Diagnosis Date  . COPD (chronic obstructive pulmonary disease) (HCC)   . Hypertension   . Anxiety   . Asthma   . Shortness of breath dyspnea     exertion  . Anemia     past hx  . TIA (transient ischemic attack)     vision issues  . Neck stiffness     limited movement s/p fusion c4-c5, c5-c6    Patient Active Problem List   Diagnosis Date Noted  . CVA (cerebral infarction) 10/27/2014  . COPD (chronic obstructive pulmonary disease) (HCC) 10/27/2014  . Chronic respiratory failure (HCC) 10/27/2014  . Anxiety 10/27/2014  . Hypertension 10/27/2014    Past Surgical History  Procedure Laterality Date  . Cervical fusion  1973, 1999    C4-5-6, C4-5  . Cardiac catheterization      "many yrs ago"  . Tonsillectomy  1956  . Dilation and curettage of uterus  1999  . Cataract extraction w/phaco Left 01/17/2015    Procedure: CATARACT EXTRACTION PHACO AND INTRAOCULAR LENS PLACEMENT (IOC);  Surgeon: Sherald Hess, MD;  Location: [facility];  Service: Ophthalmology;  Laterality: Left;    Current Outpatient Rx  Name  Route  Sig  Dispense  Refill  . ALPRAZolam (XANAX) 0.5 MG tablet   Oral   Take 0.25-0.5 mg by mouth 2 (two) times daily as needed for anxiety.          Marland Kitchen amLODipine (NORVASC) 10 MG tablet   Oral   Take 1 tablet (10 mg total) by mouth daily.   30 tablet   0   . Ascorbic Acid (VITAMIN C PO)   Oral   Take 1 tablet by mouth daily.         Marland Kitchen aspirin 325 MG tablet   Oral   Take 1 tablet (325 mg total) by mouth daily.   30 tablet   0   . clopidogrel (PLAVIX) 75 MG tablet   Oral   Take 75 mg by mouth daily. AM         . diphenhydramine-acetaminophen (TYLENOL PM) 25-500 MG TABS   Oral   Take 2 tablets by mouth at bedtime as needed (for sleep).         . docusate sodium (COLACE) 100 MG capsule   Oral   Take 100 mg by mouth at bedtime.         Marland Kitchen doxycycline (VIBRAMYCIN) 100 MG capsule   Oral  Take 1 capsule (100 mg total) by mouth 2 (two) times daily.   20 capsule   0   . estrogen, conjugated,-medroxyprogesterone (PREMPRO) 0.625-2.5 MG per tablet   Oral   Take 1 tablet by mouth 2 (two) times a week. Pt takes on Tuesday and Thursday.         Marland Kitchen ibuprofen (ADVIL,MOTRIN) 200 MG tablet   Oral   Take 200 mg by mouth every 6 (six) hours as needed.         Marland Kitchen ipratropium-albuterol (DUONEB) 0.5-2.5 (3) MG/3ML SOLN   Nebulization   Take 3 mLs by nebulization every 4 (four) hours as needed (for shortness of breath).         . lamoTRIgine (LAMICTAL) 25 MG tablet   Oral   Take 25 mg by mouth at bedtime.         . levalbuterol (XOPENEX HFA) 45 MCG/ACT inhaler   Inhalation   Inhale 2 puffs into the lungs every 4 (four) hours as needed for wheezing.         Marland Kitchen levothyroxine (SYNTHROID, LEVOTHROID) 88 MCG tablet   Oral   Take 88 mcg by mouth daily.         . mometasone-formoterol (DULERA) 100-5 MCG/ACT AERO   Inhalation   Inhale 2  puffs into the lungs 2 (two) times daily.         . montelukast (SINGULAIR) 10 MG tablet   Oral   Take 10 mg by mouth at bedtime.         . Multiple Vitamins-Minerals (ZINC PO)   Oral   Take 1 tablet by mouth daily.         Marland Kitchen nystatin (MYCOSTATIN) 100000 UNIT/ML suspension   Oral   Take 5 mLs by mouth as needed (for thrush).         . OLANZapine (ZYPREXA) 2.5 MG tablet   Oral   Take 2.5 mg by mouth at bedtime.         . simvastatin (ZOCOR) 20 MG tablet   Oral   Take 20 mg by mouth at bedtime.         Marland Kitchen tiotropium (SPIRIVA) 18 MCG inhalation capsule   Inhalation   Place 18 mcg into inhaler and inhale daily.         Marland Kitchen venlafaxine XR (EFFEXOR-XR) 150 MG 24 hr capsule   Oral   Take 150 mg by mouth daily.         . Vitamin D, Ergocalciferol, (DRISDOL) 50000 UNITS CAPS capsule   Oral   Take 50,000 Units by mouth every 7 (seven) days. Pt takes on Sunday.         Marland Kitchen VITAMIN E PO   Oral   Take 1 tablet by mouth daily.           Allergies Fluvirin; Penicillins; and Tetanus toxoids  Family History  Problem Relation Age of Onset  . Stroke Mother   . CAD Mother   . CAD Father   . Alcohol abuse Brother     Social History Social History  Substance Use Topics  . Smoking status: Light Tobacco Smoker -- 50 years    Types: Cigarettes  . Smokeless tobacco: Never Used     Comment: 0-2 cigs daily  . Alcohol Use: Yes     Comment: occasionally - 1 drink/6 months    Review of Systems Constitutional: No fever/chills Eyes: As above ENT: No sore throat. Cardiovascular: Denies chest pain. Respiratory: Denies shortness of  breath. Gastrointestinal: No abdominal pain.  No nausea, no vomiting.  No diarrhea.  No constipation. Genitourinary: Negative for dysuria. Musculoskeletal: Negative for back pain. Skin: Negative for rash. Neurological: Negative for headaches, focal weakness or numbness.  10-point ROS otherwise  negative.  ____________________________________________   PHYSICAL EXAM:  VITAL SIGNS: ED Triage Vitals  Enc Vitals Group     BP 02/28/15 1723 154/101 mmHg     Pulse Rate 02/28/15 1723 91     Resp 02/28/15 1723 16     Temp 02/28/15 1836 98.2 F (36.8 C)     Temp Source 02/28/15 1723 Oral     SpO2 02/28/15 1723 95 %     Weight 02/28/15 1856 158 lb 4.8 oz (71.804 kg)     Height 02/28/15 1856  (1.575 m)     Head Cir --      Peak Flow --      Pain Score --      Pain Loc --      Pain Edu? --      Excl. in GC? --     Constitutional: Alert and oriented. Well appearing and in no acute distress. Eyes: Conjunctivae are normal. EOMI. left eye was dilated at the ophthalmologists earlier this afternoon. Patient is 20/200 in the left eye and says that she only sees blurred images from the right eye which is her baseline secondary to old strokes affecting the right eye. Head: Atraumatic. Nose: No congestion/rhinnorhea. Mouth/Throat: Mucous membranes are moist.  Oropharynx non-erythematous. Neck: No stridor.   Cardiovascular: Normal rate, regular rhythm. Grossly normal heart sounds.  Good peripheral circulation. Respiratory: Normal respiratory effort.  No retractions. Lungs CTAB. Gastrointestinal: Soft and nontender. No distention. No abdominal bruits. No CVA tenderness. Musculoskeletal: No lower extremity tenderness nor edema.  No joint effusions. Neurologic:  Normal speech and language. Blurred vision to the bilateral eyes. No other deficits appreciated. No gait instability. Skin:  Skin is warm, dry and intact. No rash noted. Psychiatric: Mood and affect are normal. Speech and behavior are normal.  NIH Stroke Scale   Person Administering Scale: Arelia Longest  Administer stroke scale items in the order listed. Record performance in each category after each subscale exam. Do not go back and change scores. Follow directions provided for each exam technique. Scores should  reflect what the patient does, not what the clinician thinks the patient can do. The clinician should record answers while administering the exam and work quickly. Except where indicated, the patient should not be coached (i.e., repeated requests to patient to make a special effort).   1a  Level of consciousness: 0=alert; keenly responsive  1b. LOC questions:  0=Performs both tasks correctly  1c. LOC commands: 0=Performs both tasks correctly  2.  Best Gaze: 0=normal  3.  Visual: 1=Partial hemianopia  4. Facial Palsy: 0=Normal symmetric movement  5a.  Motor left arm: 0=No drift, limb holds 90 (or 45) degrees for full 10 seconds  5b.  Motor right arm: 0=No drift, limb holds 90 (or 45) degrees for full 10 seconds  6a. motor left leg: 0=No drift, limb holds 90 (or 45) degrees for full 10 seconds  6b  Motor right leg:  0=No drift, limb holds 90 (or 45) degrees for full 10 seconds  7. Limb Ataxia: 0=Absent  8.  Sensory: 0=Normal; no sensory loss  9. Best Language:  0=No aphasia, normal  10. Dysarthria: 0=Normal  11. Extinction and Inattention: 0=No abnormality  12. Distal motor function: 0=Normal  Total:   1   ____________________________________________   LABS (all labs ordered are listed, but only abnormal results are displayed)  Labs Reviewed  CBC - Abnormal; Notable for the following:    RDW 14.6 (*)    Platelets 146 (*)    All other components within normal limits  COMPREHENSIVE METABOLIC PANEL - Abnormal; Notable for the following:    Chloride 94 (*)    CO2 40 (*)    Creatinine, Ser 0.39 (*)    ALT 13 (*)    All other components within normal limits  DIFFERENTIAL  GLUCOSE, CAPILLARY  PROTIME-INR  APTT  I-STAT TROPOININ, ED  CBG MONITORING, ED  I-STAT CHEM 8, ED   ____________________________________________  EKG  ED ECG REPORT I, Schaevitz,  Teena Iraniavid M, the attending physician, personally viewed and interpreted this ECG.   Date: 02/28/2015  EKG Time: 1831  Rate:  89  Rhythm: normal sinus rhythm  Axis: Normal axis  Intervals:none  ST&T Change: No ST elevations or depressions. T-wave inversion in aVL.  No change from 10/27/2014. ____________________________________________  RADIOLOGY  Stable CT exam without any acute intercranial process. ____________________________________________   PROCEDURES    ____________________________________________   INITIAL IMPRESSION / ASSESSMENT AND PLAN / ED COURSE  Pertinent labs & imaging results that were available during my care of the patient were reviewed by me and considered in my medical decision making (see chart for details).  ----------------------------------------- 7:23 PM on 02/28/2015 -----------------------------------------  Discussed case with the specialist [facility] who does not recommend TPA due to improving vision. He recommends admission to telemetry for stroke workup. He gives the patient is stroke score 1. ____________________________________________   FINAL CLINICAL IMPRESSION(S) / ED DIAGNOSES   Acute cva.    Myrna Blazeravid Matthew Schaevitz, MD 02/28/15 1928  Signed out to Dr. Amado CoeGouru.    Myrna Blazeravid Matthew Schaevitz, MD 02/28/15 [phone]

## 2015-02-28 NOTE — Progress Notes (Signed)
   02/28/15 1810  Clinical Encounter Type  Visited With Patient and family together  Visit Type Initial  Referral From Nurse  Consult/Referral To Chaplain  Spiritual Encounters  Spiritual Needs Emotional  Paged to ED. Visited with Pt and husband for pastoral care. Konrad PentaChap Rick Hykeem Ojeda [phone]

## 2015-02-28 NOTE — H&P (Addendum)
[facility] -  at [facility]   PATIENT NAME: Shannon Wilkerson    MR#:  [id number]  DATE OF BIRTH:  Nov 07, 1945  DATE OF ADMISSION:  02/28/2015  PRIMARY CARE PHYSICIAN: Alan Mulder, MD   REQUESTING/REFERRING PHYSICIAN: Schavitz  CHIEF COMPLAINT:   Left eye blurry vision HISTORY OF PRESENT ILLNESS:  Shannon Wilkerson  is a 69 y.o. female with a known history of 3 TIAs in the past, hypertension, anxiety and COPD is presenting to the ED with a chief complaint of blurry vision on the left eye. Patient had some blurry vision last night which was resolved but again today at around 2 PM she started having floaters in the left eye and her vision became grey.She was seen by her ophthalmologist, Dr. Inez Pilgrim, who sent her to the emergency department for evaluation for stroke . He diagnosed a homonymous left hemianopia. The patient took her aspirin and Plavix last at 10 AM this morning. Denies any weakness or numbness.She says that there appears to be a wavy line across the bottom of the eye where she only sees grey below it. CT head is negative. Denies any headache or difficulty with swallowing   PAST MEDICAL HISTORY:   Past Medical History  Diagnosis Date  . COPD (chronic obstructive pulmonary disease) (HCC)   . Hypertension   . Anxiety   . Asthma   . Shortness of breath dyspnea     exertion  . Anemia     past hx  . TIA (transient ischemic attack)     vision issues  . Neck stiffness     limited movement s/p fusion c4-c5, c5-c6    PAST SURGICAL HISTOIRY:   Past Surgical History  Procedure Laterality Date  . Cervical fusion  1973, 1999    C4-5-6, C4-5  . Cardiac catheterization      "many yrs ago"  . Tonsillectomy  1956  . Dilation and curettage of uterus  1999  . Cataract extraction w/phaco Left 01/17/2015    Procedure: CATARACT EXTRACTION PHACO AND INTRAOCULAR LENS PLACEMENT (IOC);  Surgeon: Sherald Hess, MD;  Location: [facility];  Service: Ophthalmology;  Laterality: Left;    SOCIAL HISTORY:   Social History  Substance Use Topics  . Smoking status: Light Tobacco Smoker -- 50 years    Types: Cigarettes  . Smokeless tobacco: Never Used     Comment: 0-2 cigs daily  . Alcohol Use: Yes     Comment: occasionally - 1 drink/6 months    FAMILY HISTORY:   Family History  Problem Relation Age of Onset  . Stroke Mother   . CAD Mother   . CAD Father   . Alcohol abuse Brother     DRUG ALLERGIES:   Allergies  Allergen Reactions  . Fluvirin [Flu Virus Vaccine] Diarrhea  . Penicillins Hives and Other (See Comments)    Has patient had a PCN reaction causing immediate rash, facial/tongue/throat swelling, SOB or lightheadedness with hypotension: No Has patient had a PCN reaction causing severe rash involving mucus membranes or skin necrosis: No Has patient had a PCN reaction that required hospitalization No Has patient had a PCN reaction occurring within the last 10 years: No If all of the above answers are "NO", then may proceed with Cephalosporin use.  . Tetanus Toxoids Diarrhea    REVIEW OF SYSTEMS:  CONSTITUTIONAL: No fever, fatigue or weakness.  EYES: Left-sided blurry vision with floaters.  EARS, NOSE, AND THROAT: No tinnitus or ear pain.  RESPIRATORY: No cough, shortness of breath, wheezing or hemoptysis.  CARDIOVASCULAR: No chest pain, orthopnea, edema.  GASTROINTESTINAL: No nausea, vomiting, diarrhea or abdominal pain.  GENITOURINARY: No dysuria, hematuria.  ENDOCRINE: No polyuria, nocturia,  HEMATOLOGY: No anemia, easy bruising or bleeding SKIN: No rash or lesion. MUSCULOSKELETAL: No joint pain or arthritis.   NEUROLOGIC: No tingling, numbness, weakness.  PSYCHIATRY: No anxiety or depression.   MEDICATIONS AT HOME:   Prior to Admission medications   Medication Sig Start Date End Date Taking? Authorizing Provider  ALPRAZolam Prudy Feeler) 0.5 MG tablet Take 0.25-0.5 mg by mouth 2 (two) times  daily as needed for anxiety.    Yes Historical Provider, MD  Ascorbic Acid (VITAMIN C) 1000 MG tablet Take 1,000 mg by mouth daily.   Yes Historical Provider, MD  aspirin 325 MG tablet Take 1 tablet (325 mg total) by mouth daily. 10/28/14  Yes Enedina Finner, MD  clopidogrel (PLAVIX) 75 MG tablet Take 75 mg by mouth daily.    Yes Historical Provider, MD  diphenhydramine-acetaminophen (TYLENOL PM) 25-500 MG TABS Take 2 tablets by mouth at bedtime as needed (for sleep).   Yes Historical Provider, MD  docusate sodium (COLACE) 100 MG capsule Take 100 mg by mouth at bedtime.   Yes Historical Provider, MD  furosemide (LASIX) 20 MG tablet Take 20 mg by mouth daily as needed for edema.   Yes Historical Provider, MD  ibuprofen (ADVIL,MOTRIN) 200 MG tablet Take 600 mg by mouth every 6 (six) hours as needed for mild pain.    Yes Historical Provider, MD  ipratropium-albuterol (DUONEB) 0.5-2.5 (3) MG/3ML SOLN Take 3 mLs by nebulization every 4 (four) hours as needed (for shortness of breath).   Yes Historical Provider, MD  lamoTRIgine (LAMICTAL) 25 MG tablet Take 25 mg by mouth at bedtime.   Yes Historical Provider, MD  levalbuterol [facility] HFA) 45 MCG/ACT inhaler Inhale 2 puffs into the lungs every 6 (six) hours as needed for wheezing or shortness of breath.    Yes Historical Provider, MD  levothyroxine (SYNTHROID, LEVOTHROID) 88 MCG tablet Take 88 mcg by mouth daily.   Yes Historical Provider, MD  mometasone-formoterol (DULERA) 100-5 MCG/ACT AERO Inhale 2 puffs into the lungs 2 (two) times daily.   Yes Historical Provider, MD  montelukast (SINGULAIR) 10 MG tablet Take 10 mg by mouth at bedtime.   Yes Historical Provider, MD  nystatin (MYCOSTATIN) 100000 UNIT/ML suspension Take 5 mLs by mouth as needed (for thrush).   Yes Historical Provider, MD  OLANZapine (ZYPREXA) 2.5 MG tablet Take 2.5 mg by mouth at bedtime.   Yes Historical Provider, MD  roflumilast (DALIRESP) 500 MCG TABS tablet Take 500 mcg by mouth daily.    Yes Historical Provider, MD  simvastatin (ZOCOR) 20 MG tablet Take 20 mg by mouth at bedtime.   Yes Historical Provider, MD  tiotropium (SPIRIVA) 18 MCG inhalation capsule Place 18 mcg into inhaler and inhale daily.   Yes Historical Provider, MD  venlafaxine XR (EFFEXOR-XR) 150 MG 24 hr capsule Take 150 mg by mouth daily.   Yes Historical Provider, MD  Vitamin D, Ergocalciferol, (DRISDOL) 50000 UNITS CAPS capsule Take 50,000 Units by mouth every 7 (seven) days. Pt takes on Sunday.   Yes Historical Provider, MD  vitamin E 200 UNIT capsule Take 200 Units by mouth daily.   Yes Historical Provider, MD  zinc gluconate 50 MG tablet Take 50 mg by mouth daily.   Yes Historical Provider, MD  amLODipine (NORVASC) 10 MG tablet Take 1  tablet (10 mg total) by mouth daily. Patient not taking: Reported on 02/28/2015 10/28/14   Enedina Finner, MD  doxycycline (VIBRAMYCIN) 100 MG capsule Take 1 capsule (100 mg total) by mouth 2 (two) times daily. Patient not taking: Reported on 02/28/2015 11/30/14   Lutricia Feil, PA-C      VITAL SIGNS:  Blood pressure 151/77, pulse 86, temperature 98.2 F (36.8 C), resp. rate 15, height  (1.575 m), weight 71.804 kg (158 lb 4.8 oz), SpO2 100 %.  PHYSICAL EXAMINATION:  GENERAL:  69 y.o.-year-old patient lying in the bed with no acute distress.  EYES: Pupils equal, round, reactive to light and accommodation. No scleral icterus. Left homonymous hemianopia diagnosed by patient's ophthalmologist HEENT: Head atraumatic, normocephalic. Oropharynx and nasopharynx clear.  NECK:  Supple, no jugular venous distention. No thyroid enlargement, no tenderness.  LUNGS: Normal breath sounds bilaterally, no wheezing, rales,rhonchi or crepitation. No use of accessory muscles of respiration.  CARDIOVASCULAR: S1, S2 normal. No murmurs, rubs, or gallops.  ABDOMEN: Soft, nontender, nondistended. Bowel sounds present. No organomegaly or mass.  EXTREMITIES: No pedal edema, cyanosis, or clubbing.   NEUROLOGIC: Cranial nerves II through XII are intact. Muscle strength 5/5 in all extremities. Sensation intact. Gait not checked.  PSYCHIATRIC: The patient is alert and oriented x 3.  SKIN: No obvious rash, lesion, or ulcer.   LABORATORY PANEL:   CBC  Recent Labs Lab 02/28/15 1800  WBC 5.3  HGB 12.0  HCT 37.6  PLT 146*   ------------------------------------------------------------------------------------------------------------------  Chemistries   Recent Labs Lab 02/28/15 1800  NA 142  K 4.5  CL 94*  CO2 40*  GLUCOSE 86  BUN 9  CREATININE 0.39*  CALCIUM 9.5  AST 17  ALT 13*  ALKPHOS 57  BILITOT 0.4   ------------------------------------------------------------------------------------------------------------------  Cardiac Enzymes No results for input(s): TROPONINI in the last 168 hours. ------------------------------------------------------------------------------------------------------------------  RADIOLOGY:  Ct Head Wo Contrast  02/28/2015  CLINICAL DATA:  Left eye visual disturbance, code stroke EXAM: CT HEAD WITHOUT CONTRAST TECHNIQUE: Contiguous axial images were obtained from the base of the skull through the vertex without contrast. COMPARISON:  10/27/2014 FINDINGS: Age related brain atrophy and mild chronic white matter microvascular change throughout the cerebral hemispheres. No acute intracranial hemorrhage, mass lesion, definite infarction, midline shift, herniation, hydrocephalus, or extra-axial fluid collection. No focal mass effect or edema. Cisterns patent. No gross cerebellar abnormality. Mastoids and visualized sinuses remain clear. Orbits appear symmetric. IMPRESSION: Stable exam.  No acute intracranial process by noncontrast CT. These results were called by telephone at the time of interpretation on 02/28/2015 at 5:47 pm to Dr. Gladstone Pih , who verbally acknowledged these results. Electronically Signed   By: Judie Petit.  Shick M.D.   On: 02/28/2015 17:48     EKG:   Orders placed or performed during the hospital encounter of 02/28/15  . ED EKG  . ED EKG  . EKG 12-Lead  . EKG 12-Lead  . EKG 12-Lead  . EKG 12-Lead    IMPRESSION AND PLAN:  ANTARA BRECHEISEN is a 69 y.o. female with a history of TIA in the past who is presenting today with vision loss to her left eye. She says that the vision loss began last night with light sensitivity and flashing lights to the left eye. She says that it improved over the night but then worsened again at 2 PM today. She was seen by her ophthalmologist, Dr. Inez Pilgrim, who sent her to the emergency department for evaluation for stroke. He diagnosed  a homonymous left hemianopia. The patient took her aspirin and Plavix last at 10 AM this morning. Denies any weakness or numbness.She says that there appears to be a wavy line across the bottom of the eye where she only sees grey below it.    1. Clinical stroke-left hemianopia CT head is negative Will get complete stroke workup with MRI of the brain, carotid Dopplers and 2-D echocardiogram Neuro checks per protocol Neurology consult is placed Will check fasting lipid panel in a.m. Will provide her with aspirin 325 mg by mouth once daily Bedside swallow evaluation PT consult is placed for evaluation Patient does not meet criteria for TPA as she has passed the window.  2. Chronic history of COPD-with chronic hypoxic respiratory failure Currently with no exacerbation. Continue home oxygen 2 L via nasal cannula. Provide nebulizer treatments as needed basis  3. Essential hypertension We will allow permissive hypertension and hold by mouth home medications  4. Anxiety Will consider resuming her home medications Xanax if she passes swallow evaluation   5. Tobacco abuse Counseled patient to quit smoking for 3-5 minutes. Patient is willing to quit smoking,. Will provide nicotine patch  will provide DVT prophylaxis with Lovenox subcutaneous     All the  records are reviewed and case discussed with ED provider. Management plans discussed with the patient, family and they are in agreement. Greater than 50% time was spent on coordination of care and face-to-face counseling   CODE STATUS: fc, husband is the healthcare power of attorney  TOTAL TIME TAKING CARE OF THIS PATIENT: 45 minutes.    Ramonita LabGouru, Myesha Stillion M.D on 02/28/2015 at 8:37 PM  Between 7am to 6pm - Pager - [phone]  After 6pm go to [email or web address] - password [email or web address] [email or web address]  [facility]  Office  [phone]  CC: Primary care physician; Alan MulderMORAYATI,SHAMIL J, MD

## 2015-02-28 NOTE — ED Notes (Signed)
MD at bedside for exam

## 2015-02-28 NOTE — ED Notes (Signed)
Pt reports loss of vision in left eye since 2pm today, reports cataract surgery 5 weeks ago.

## 2015-02-28 NOTE — ED Notes (Signed)
Pt to CT,. 

## 2015-03-01 ENCOUNTER — Inpatient Hospital Stay: Payer: Medicare Other

## 2015-03-01 ENCOUNTER — Inpatient Hospital Stay
Admit: 2015-03-01 | Discharge: 2015-03-01 | Disposition: A | Discharge status: Home / Self Care | Payer: Medicare Other | Attending: Internal Medicine | Admitting: Internal Medicine

## 2015-03-01 DIAGNOSIS — F411 Generalized anxiety disorder: Secondary | ICD-10-CM

## 2015-03-01 DIAGNOSIS — F329 Major depressive disorder, single episode, unspecified: Secondary | ICD-10-CM

## 2015-03-01 DIAGNOSIS — F331 Major depressive disorder, recurrent, moderate: Secondary | ICD-10-CM

## 2015-03-01 DIAGNOSIS — I63431 Cerebral infarction due to embolism of right posterior cerebral artery: Secondary | ICD-10-CM

## 2015-03-01 LAB — LIPID PANEL
CHOL/HDL RATIO: 3 ratio
Cholesterol: 182 mg/dL (ref 0–200)
HDL: 60 mg/dL (ref >40)
LDL CALC: 95 mg/dL (ref 0–99)
Triglycerides: 136 mg/dL (ref <150)
VLDL: 27 mg/dL (ref 0–40)

## 2015-03-01 LAB — HEMOGLOBIN A1C: HEMOGLOBIN A1C: 4.9 % (ref 4.0–6.0)

## 2015-03-01 MED ORDER — CLOPIDOGREL BISULFATE 75 MG PO TABS
75.0000 mg | ORAL_TABLET | Freq: Every day | ORAL | Status: DC
  Administered 2015-03-01 – 2015-03-02 (×2): 75 mg via ORAL
  Filled 2015-03-01 (×2): qty 1

## 2015-03-01 MED ORDER — LAMOTRIGINE 25 MG PO TABS
25.0000 mg | ORAL_TABLET | Freq: Every day | ORAL | Status: DC
  Administered 2015-03-01: 25 mg via ORAL
  Filled 2015-03-01: qty 1

## 2015-03-01 MED ORDER — SIMVASTATIN 20 MG PO TABS: 20.0000 mg | ORAL_TABLET | Freq: Every day | ORAL | Status: DC

## 2015-03-01 MED ORDER — ACETAMINOPHEN 325 MG PO TABS
650.0000 mg | ORAL_TABLET | Freq: Four times a day (QID) | ORAL | Status: DC | PRN
  Administered 2015-03-01: 03:00:00 650 mg via ORAL
  Filled 2015-03-01: qty 2

## 2015-03-01 MED ORDER — ATORVASTATIN CALCIUM 20 MG PO TABS
40.0000 mg | ORAL_TABLET | Freq: Every day | ORAL | Status: DC
  Administered 2015-03-01: 40 mg via ORAL
  Filled 2015-03-01: qty 2

## 2015-03-01 MED ORDER — METHYLPREDNISOLONE SODIUM SUCC 40 MG IJ SOLR
40.0000 mg | Freq: Four times a day (QID) | INTRAMUSCULAR | Status: DC
  Administered 2015-03-01 – 2015-03-02 (×5): 40 mg via INTRAVENOUS
  Filled 2015-03-01 (×6): qty 1

## 2015-03-01 MED ORDER — ALPRAZOLAM 0.25 MG PO TABS
0.2500 mg | ORAL_TABLET | Freq: Two times a day (BID) | ORAL | Status: DC | PRN
  Administered 2015-03-01: 0.5 mg via ORAL
  Filled 2015-03-01: qty 2

## 2015-03-01 MED ORDER — LEVALBUTEROL TARTRATE 45 MCG/ACT IN AERO
2.0000 | INHALATION_SPRAY | Freq: Four times a day (QID) | RESPIRATORY_TRACT | Status: DC | PRN
  Administered 2015-03-01 (×2): 2 via RESPIRATORY_TRACT

## 2015-03-01 MED ORDER — NYSTATIN 100000 UNIT/ML MT SUSP: 5.0000 mL | OROMUCOSAL | Status: DC | PRN

## 2015-03-01 MED ORDER — VENLAFAXINE HCL ER 37.5 MG PO CP24
37.5000 mg | ORAL_CAPSULE | Freq: Every day | ORAL | Status: DC
  Administered 2015-03-02: 37.5 mg via ORAL
  Filled 2015-03-01: qty 1

## 2015-03-01 MED ORDER — MONTELUKAST SODIUM 10 MG PO TABS
10.0000 mg | ORAL_TABLET | Freq: Every day | ORAL | Status: DC
  Administered 2015-03-01: 22:00:00 10 mg via ORAL
  Filled 2015-03-01: qty 1

## 2015-03-01 MED ORDER — ALPRAZOLAM 0.25 MG PO TABS
0.2500 mg | ORAL_TABLET | Freq: Four times a day (QID) | ORAL | Status: DC | PRN
  Administered 2015-03-01 – 2015-03-02 (×3): 0.25 mg via ORAL
  Filled 2015-03-01 (×3): qty 1

## 2015-03-01 MED ORDER — LEVOTHYROXINE SODIUM 88 MCG PO TABS
88.0000 ug | ORAL_TABLET | Freq: Every day | ORAL | Status: DC
  Administered 2015-03-01 – 2015-03-02 (×2): 88 ug via ORAL
  Filled 2015-03-01 (×3): qty 1

## 2015-03-01 MED ORDER — TIOTROPIUM BROMIDE MONOHYDRATE 18 MCG IN CAPS
18.0000 ug | ORAL_CAPSULE | Freq: Every day | RESPIRATORY_TRACT | Status: DC
  Administered 2015-03-01 – 2015-03-02 (×2): 18 ug via RESPIRATORY_TRACT
  Filled 2015-03-01: qty 5

## 2015-03-01 MED ORDER — ALPRAZOLAM 0.25 MG PO TABS: 0.1250 mg | ORAL_TABLET | ORAL | Status: DC | PRN

## 2015-03-01 MED ORDER — DOCUSATE SODIUM 100 MG PO CAPS
100.0000 mg | ORAL_CAPSULE | Freq: Every day | ORAL | Status: DC
  Filled 2015-03-01: qty 1

## 2015-03-01 MED ORDER — VITAMIN E 45 MG (100 UNIT) PO CAPS
200.0000 [IU] | ORAL_CAPSULE | Freq: Every day | ORAL | Status: DC
  Administered 2015-03-01 – 2015-03-02 (×2): 200 [IU] via ORAL
  Filled 2015-03-01 (×2): qty 2

## 2015-03-01 MED ORDER — VENLAFAXINE HCL ER 37.5 MG PO CP24
150.0000 mg | ORAL_CAPSULE | Freq: Every day | ORAL | Status: DC
  Administered 2015-03-01 – 2015-03-02 (×2): 150 mg via ORAL
  Filled 2015-03-01 (×2): qty 4

## 2015-03-01 MED ORDER — ROFLUMILAST 500 MCG PO TABS
500.0000 ug | ORAL_TABLET | Freq: Every day | ORAL | Status: DC
  Administered 2015-03-01 – 2015-03-02 (×2): 500 ug via ORAL
  Filled 2015-03-01 (×2): qty 1

## 2015-03-01 MED ORDER — IPRATROPIUM-ALBUTEROL 0.5-2.5 (3) MG/3ML IN SOLN: 3.0000 mL | RESPIRATORY_TRACT | Status: DC | PRN

## 2015-03-01 MED ORDER — ASPIRIN 325 MG PO TABS
325.0000 mg | ORAL_TABLET | Freq: Every day | ORAL | Status: DC
  Administered 2015-03-01 – 2015-03-02 (×2): 325 mg via ORAL
  Filled 2015-03-01 (×2): qty 1

## 2015-03-01 MED ORDER — ALBUTEROL SULFATE (2.5 MG/3ML) 0.083% IN NEBU
2.5000 mg | INHALATION_SOLUTION | Freq: Four times a day (QID) | RESPIRATORY_TRACT | Status: DC
  Administered 2015-03-01 – 2015-03-02 (×2): 2.5 mg via RESPIRATORY_TRACT
  Filled 2015-03-01 (×2): qty 3

## 2015-03-01 MED ORDER — MOMETASONE FURO-FORMOTEROL FUM 100-5 MCG/ACT IN AERO
2.0000 | INHALATION_SPRAY | Freq: Two times a day (BID) | RESPIRATORY_TRACT | Status: DC
  Administered 2015-03-01 – 2015-03-02 (×3): 2 via RESPIRATORY_TRACT
  Filled 2015-03-01: qty 8.8

## 2015-03-01 MED ORDER — HALOPERIDOL LACTATE 5 MG/ML IJ SOLN
2.0000 mg | Freq: Once | INTRAMUSCULAR | Status: AC
  Administered 2015-03-01: 07:00:00 2 mg via INTRAVENOUS
  Filled 2015-03-01: qty 1

## 2015-03-01 MED ORDER — OLANZAPINE 2.5 MG PO TABS
2.5000 mg | ORAL_TABLET | Freq: Every day | ORAL | Status: DC
  Administered 2015-03-01: 22:00:00 2.5 mg via ORAL
  Filled 2015-03-01 (×2): qty 1

## 2015-03-01 MED ORDER — ZINC SULFATE 220 (50 ZN) MG PO CAPS
220.0000 mg | ORAL_CAPSULE | Freq: Every day | ORAL | Status: DC
  Administered 2015-03-01 – 2015-03-02 (×2): 220 mg via ORAL
  Filled 2015-03-01 (×2): qty 1

## 2015-03-01 MED ORDER — VITAMIN C 500 MG PO TABS
1000.0000 mg | ORAL_TABLET | Freq: Every day | ORAL | Status: DC
  Administered 2015-03-01 – 2015-03-02 (×2): 1000 mg via ORAL
  Filled 2015-03-01 (×2): qty 2

## 2015-03-01 MED ORDER — OXYCODONE HCL 5 MG PO TABS: 5.0000 mg | ORAL_TABLET | ORAL | Status: DC | PRN

## 2015-03-01 NOTE — Progress Notes (Signed)
*  PRELIMINARY RESULTS* Echocardiogram 2D Echocardiogram has been performed.  Shannon HousekeeperJerry R Wilkerson 03/01/2015, 8:45 AM

## 2015-03-01 NOTE — Plan of Care (Signed)
Problem: Safety: Goal: Ability to remain free from injury will improve Outcome: Progressing High Fall Risk. Patient is refusing bed alarm - education provided.   Problem: Pain Managment: Goal: General experience of comfort will improve Outcome: Progressing No complaints of pain this shift.   Problem: Self-Care: Goal: Verbalization of feelings and concerns over difficulty with self-care will improve Outcome: Progressing Patient remains anxious, therapeutic presence provided. Xanax given - relief noted. Husband at bedside.

## 2015-03-01 NOTE — Care Management Important Message (Signed)
Important Message  Patient Details  Name: Shannon Wilkerson MRN: [id number] Date of Birth: July 25, 1945   Medicare Important Message Given:  Yes-second notification given    Gwenette GreetBrenda S Morris Longenecker, RN 03/01/2015, 10:18 AM

## 2015-03-01 NOTE — Evaluation (Signed)
Physical Therapy Evaluation Patient Details Name: Shannon HartJoann S Rasmus MRN: [id number] DOB: 05-Mar-1946 Today's Date: 03/01/2015   History of Present Illness  Pt is a 69 y.o. female presenting to hospital with L eye blurry vision (L hemianopia).  CT of head negative for acute intracranial abnormality.  Pt unable to tolerate MRI.  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  Pt is on chronic home O2 (2 L/min at rest; 2.5 L/min with activity).  PMH includes h/o 3 TIA's, htn, anxiety, COPD, C4-6 fusion.  Clinical Impression  Currently pt demonstrates impairments with strength, pulmonary activity tolerance, balance, and limitations with functional mobility.  Prior to admission, pt was independent without AD but reports having more difficulty managing ADL's (cooking, bathing. etc) at home d/t SOB (pt also acknowledging recent panic attacks but pt unable to determine if SOB d/t panic attacks or d/t pulmonary issues).  Pt lives with her husband but sleeps alone on 2nd floor (pt reports she may be able to sleep on couch on main floor).  Currently pt is CGA to min assist with transfers and short ambulation in room (limited d/t pt suddenly c/o significant SOB/unable to catch her breath; O2 sats 95% on 3 L/min via nasal cannula); difficult to assess pt's ambulation with vision impairments d/t limited assessment but pt was holding onto objects with her L hand to steady herself.  Pt also stating after returning to bed "just take me to the morgue now" 2x's (CM, nursing, and MD notified;  Psychiatry consult ordered).   Pt would benefit from skilled PT to address above noted impairments and functional limitations.  Recommend pt discharge to home with 24/7 assist and HHPT when medically appropriate.     Follow Up Recommendations Supervision/Assistance - 24 hour;Home health PT    Equipment Recommendations   (TBD)     Recommendations for Other Services       Precautions / Restrictions Precautions Precautions: Fall Restrictions Weight Bearing Restrictions: No      Mobility  Bed Mobility Overal bed mobility: Modified Independent             General bed mobility comments: Supine to/from sit with HOB elevated  Transfers Overall transfer level: Needs assistance Equipment used: None Transfers: Sit to/from Stand Sit to Stand: Min guard;Min assist         General transfer comment: pt mildly unsteady and holding onto objects in room  Ambulation/Gait Ambulation/Gait assistance: Min guard;Min assist Ambulation Distance (Feet): 15 Feet Assistive device: None   Gait velocity: decreased   General Gait Details: pt holding onto objects in room with L hand; pt with some SOB at rest but with minimally activity pt suddenly had difficulty catching her breath (O2 sats 95% on 3 L/min via nasal cannula; question if d/t panic attack) and requiring extra time and assist to get back to bed safely; decreased B step length/foot clearance/heelstike noted  Stairs            Wheelchair Mobility    Modified Rankin (Stroke Patients Only)       Balance Overall balance assessment: Needs assistance Sitting-balance support: No upper extremity supported;Feet supported Sitting balance-Leahy Scale: Normal     Standing balance support: No upper extremity supported Standing balance-Leahy Scale: Fair Standing balance comment: mildly unsteady and pt wanting to hold onto objects  Pertinent Vitals/Pain Pain Assessment: No/denies pain    Home Living Family/patient expects to be discharged to:: Private residence Living Arrangements: Spouse/significant other Available Help at Discharge: Family Type of Home: House Home Access: Stairs to enter Entrance Stairs-Rails: Right;Left;Can reach both Entrance Stairs-Number of Steps: 6 Home Layout: Two level Home  Equipment: Environmental consultant - 2 wheels;Cane - single point;Shower seat Additional Comments: Pt denies any recent falls    Prior Function Level of Independence: Independent         Comments: Pt reporting increasing SOB with activity limiting what she can accomplish at home with her ADL's.     Hand Dominance        Extremity/Trunk Assessment   Upper Extremity Assessment: Generalized weakness           Lower Extremity Assessment: Generalized weakness         Communication   Communication: No difficulties  Cognition Arousal/Alertness: Awake/alert Behavior During Therapy: Anxious Overall Cognitive Status: Within Functional Limits for tasks assessed                      General Comments General comments (skin integrity, edema, etc.): Pt mildly SOB at rest and reporting having panic attacks recently.  Nursing cleared pt for participation in physical therapy.  Pt agreeable to PT session.    Exercises        Assessment/Plan    PT Assessment Patient needs continued PT services  PT Diagnosis Difficulty walking;Generalized weakness   PT Problem List Decreased strength;Decreased activity tolerance;Decreased balance;Decreased mobility;Cardiopulmonary status limiting activity  PT Treatment Interventions DME instruction;Gait training;Stair training;Functional mobility training;Therapeutic activities;Therapeutic exercise;Balance training;Neuromuscular re-education;Patient/family education   PT Goals (Current goals can be found in the Care Plan section) Acute Rehab PT Goals Patient Stated Goal: to improve breathing PT Goal Formulation: With patient Time For Goal Achievement: 03/15/15 Potential to Achieve Goals: Fair    Frequency 7X/week   Barriers to discharge Decreased caregiver support      Co-evaluation               End of Session Equipment Utilized During Treatment: Gait belt;Oxygen Activity Tolerance: Other (comment) (Limited d/t significant SOB with  activity) Patient left: in bed;with call bell/phone within reach;with bed alarm set Nurse Communication: Mobility status (SOB with activity and pt's comments during session)         Time: 1610-9604 PT Time Calculation (min) (ACUTE ONLY): 21 min   Charges:   PT Evaluation $Initial PT Evaluation Tier I: 1 Procedure     PT G CodesHendricks Limes 03/21/15, 11:46 AM Hendricks Limes, PT [phone]

## 2015-03-01 NOTE — Plan of Care (Signed)
Problem: Safety: Goal: Ability to remain free from injury will improve Outcome: Progressing Pt is a high fall risk, encouraged to call for assistance when needed.   Problem: Pain Managment: Goal: General experience of comfort will improve Outcome: Progressing Pt c/o headache 9/10. Pain medication given, therapeutic presence given. Continue to assess.  Problem: Education: Goal: Knowledge of secondary prevention will improve Outcome: Progressing Pt is alert and oriented, educated about importance of neurovascular checks q2 hours with vital signs. Patient refuses , will allow q4 VS with neuro checks.  Problem: Self-Care: Goal: Ability to participate in self-care as condition permits will improve Outcome: Progressing Pt appears anxious, tearful and angry about hospitalization. Pt states she blames herself for what has happened. She states "I want to go home" since she cannot have medication at the bedside and has to be checked on frequently. Therapeutic presence offered with education of importance for being closely monitored. Xanax given for anxiety, food offered, rest promoted.

## 2015-03-01 NOTE — Progress Notes (Signed)
Speech Therapy Note:  Reviewed chart and consulted NSG. ST spoke with Pt who stated she has no concerns with speech, language or swallowing at this time. She states she is at her baseline swallowing ability. ST provided multiple trials of water by large straw and observed no overt or immediate s/s of aspiration. Pt to remain on regular diet with no ST services unless status changes and services are necessary. ST available for f/u if required or if future concerns arise. Pt is able to communicate her wants and needs with clear, intelligible speech. NSG updated and agrees with this plan of action.

## 2015-03-01 NOTE — Consult Note (Signed)
[facility] Face-to-Face Psychiatry Consult   Reason for Consult:  Consult for this 69 year old woman currently in the hospital with a stroke. Concern about anxiety and depression Referring Physician:  Tressia Miners Patient Identification: Shannon Wilkerson MRN:  [id number] Principal Diagnosis: Major depression [facility]) Diagnosis:   Patient Active Problem List   Diagnosis Date Noted  . Major depression ([place]) [F32.9] 03/01/2015  . Generalized anxiety disorder [F41.1] 03/01/2015  . CVA (cerebral infarction) [I63.9] 10/27/2014  . COPD (chronic obstructive pulmonary disease) ([place]) [J44.9] 10/27/2014  . Chronic respiratory failure ([place]) [J96.10] 10/27/2014  . Anxiety [F41.9] 10/27/2014  . Hypertension [I10] 10/27/2014    Total Time spent with patient: 1 hour  Subjective:   Shannon Wilkerson is a 69 y.o. female patient admitted with "I feel terrible".  HPI:  Information from the patient and the chart. Patient interviewed. Chart reviewed. Old notes reviewed. Laboratory results reviewed. Medications reviewed. This is a 69 year old woman who has a history of more than one stroke. She is currently in the hospital for new stroke symptoms. Patient tells me that she is feeling terrible hopeless and depressed. She is very upset about losing her eyesight and her ability to take care of herself. She says her mood is down and anxious all the time. She also says that she has anxiety attack-like feelings all day. She has not been sleeping very well although the recent addition of olanzapine at night has been a help. Appetite is starting to decrease. Patient says she has no intention or plan of harming herself but has had thoughts that she wishes she would die. She recently says she had a symptom of not being able to get songs out of her head that were playing and keeping her up at night. When her primary care doctor added a low dose of olanzapine those got better. Patient sees a primary care nurse practitioner for her psychiatric  care. Currently taking Effexor which was recently increased from 150 mg a day to 187.5. Had recently been taking olanzapine 2.5 mg at night. Takes Xanax half milligram pills and usually divides them up into .125 or 0.25 increments.  Social history: Patient lives with her husband. Has no children. Feels like she is pretty limited in her social activity.  Medical history: Multiple medical problems. History of stroke history of chronic COPD.  Substance abuse history: Says she used marijuana decades ago but not since then and has no history of alcohol abuse.  Past Psychiatric History: Patient has been treated for depression for a long time. Denies any history of actual suicide attempts. Denies psychiatric hospitalization. She can't remember any medicines other than what she is taking now that of been used for depression. She has been trying to find a therapist but has not been able to find anyone to see locally.  Risk to Self: Is patient at risk for suicide?: No Risk to Others:   Prior Inpatient Therapy:   Prior Outpatient Therapy:    Past Medical History:  Past Medical History  Diagnosis Date  . COPD (chronic obstructive pulmonary disease) ([place])   . Hypertension   . Anxiety   . Asthma   . Shortness of breath dyspnea     exertion  . Anemia     past hx  . TIA (transient ischemic attack)     vision issues  . Neck stiffness     limited movement s/p fusion c4-c5, c5-c6    Past Surgical History  Procedure Laterality Date  . Cervical fusion  1973,  1999    C4-5-6, C4-5  . Cardiac catheterization      "many yrs ago"  . Tonsillectomy  1956  . Dilation and curettage of uterus  1999  . Cataract extraction w/phaco Left 01/17/2015    Procedure: CATARACT EXTRACTION PHACO AND INTRAOCULAR LENS PLACEMENT (IOC);  Surgeon: Ronnell Freshwater, MD;  Location: [facility];  Service: Ophthalmology;  Laterality: Left;   Family History:  Family History  Problem Relation Age of Onset  .  Stroke Mother   . CAD Mother   . CAD Father   . Alcohol abuse Brother    Family Psychiatric  History: Patient says her mother had an alcohol abuse problem no other family history. Social History:  History  Alcohol Use  . Yes    Comment: occasionally - 1 drink/6 months     History  Drug Use No    Social History   Social History  . Marital Status: Married    Spouse Name: N/A  . Number of Children: N/A  . Years of Education: N/A   Social History Main Topics  . Smoking status: Light Tobacco Smoker -- 50 years    Types: Cigarettes  . Smokeless tobacco: Never Used     Comment: 0-2 cigs daily  . Alcohol Use: Yes     Comment: occasionally - 1 drink/6 months  . Drug Use: No  . Sexual Activity: Not Asked   Other Topics Concern  . None   Social History Narrative   Additional Social History:                          Allergies:   Allergies  Allergen Reactions  . Fluvirin [Flu Virus Vaccine] Diarrhea  . Penicillins Hives and Other (See Comments)    Has patient had a PCN reaction causing immediate rash, facial/tongue/throat swelling, SOB or lightheadedness with hypotension: No Has patient had a PCN reaction causing severe rash involving mucus membranes or skin necrosis: No Has patient had a PCN reaction that required hospitalization No Has patient had a PCN reaction occurring within the last 10 years: No If all of the above answers are "NO", then may proceed with Cephalosporin use.  . Tetanus Toxoids Diarrhea    Labs:  Results for orders placed or performed during the hospital encounter of 02/28/15 (from the past 48 hour(s))  Protime-INR     Status: None   Collection Time: 02/28/15  6:00 PM  Result Value Ref Range   Prothrombin Time 12.5 11.4 - 15.0 seconds   INR 0.91   APTT     Status: None   Collection Time: 02/28/15  6:00 PM  Result Value Ref Range   aPTT 30 24 - 36 seconds  CBC     Status: Abnormal   Collection Time: 02/28/15  6:00 PM  Result Value  Ref Range   WBC 5.3 3.6 - 11.0 K/uL   RBC 4.37 3.80 - 5.20 MIL/uL   Hemoglobin 12.0 12.0 - 16.0 g/dL   HCT 37.6 35.0 - 47.0 %   MCV 85.9 80.0 - 100.0 fL   MCH 27.5 26.0 - 34.0 pg   MCHC 32.0 32.0 - 36.0 g/dL   RDW 14.6 (H) 11.5 - 14.5 %   Platelets 146 (L) 150 - 440 K/uL  Differential     Status: None   Collection Time: 02/28/15  6:00 PM  Result Value Ref Range   Neutrophils Relative % 68 %   Neutro Abs  3.6 1.4 - 6.5 K/uL   Lymphocytes Relative 23 %   Lymphs Abs 1.2 1.0 - 3.6 K/uL   Monocytes Relative 8 %   Monocytes Absolute 0.4 0.2 - 0.9 K/uL   Eosinophils Relative 1 %   Eosinophils Absolute 0.0 0 - 0.7 K/uL   Basophils Relative 0 %   Basophils Absolute 0.0 0 - 0.1 K/uL  Comprehensive metabolic panel     Status: Abnormal   Collection Time: 02/28/15  6:00 PM  Result Value Ref Range   Sodium 142 135 - 145 mmol/L   Potassium 4.5 3.5 - 5.1 mmol/L   Chloride 94 (L) 101 - 111 mmol/L   CO2 40 (H) 22 - 32 mmol/L   Glucose, Bld 86 65 - 99 mg/dL   BUN 9 6 - 20 mg/dL   Creatinine, Ser 0.39 (L) 0.44 - 1.00 mg/dL   Calcium 9.5 8.9 - 10.3 mg/dL   Total Protein 7.0 6.5 - 8.1 g/dL   Albumin 4.0 3.5 - 5.0 g/dL   AST 17 15 - 41 U/L   ALT 13 (L) 14 - 54 U/L   Alkaline Phosphatase 57 38 - 126 U/L   Total Bilirubin 0.4 0.3 - 1.2 mg/dL   GFR calc non Af Amer >60 >60 mL/min   GFR calc Af Amer >60 >60 mL/min    Comment: (NOTE) The eGFR has been calculated using the CKD EPI equation. This calculation has not been validated in all clinical situations. eGFR's persistently <60 mL/min signify possible Chronic Kidney Disease.    Anion gap 8 5 - 15  Glucose, capillary     Status: None   Collection Time: 02/28/15  6:50 PM  Result Value Ref Range   Glucose-Capillary 84 65 - 99 mg/dL  Hemoglobin A1c     Status: None   Collection Time: 03/01/15  6:20 AM  Result Value Ref Range   Hgb A1c MFr Bld 4.9 4.0 - 6.0 %  Lipid panel     Status: None   Collection Time: 03/01/15  6:20 AM  Result Value  Ref Range   Cholesterol 182 0 - 200 mg/dL   Triglycerides 136 <150 mg/dL   HDL 60 >40 mg/dL   Total CHOL/HDL Ratio 3.0 RATIO   VLDL 27 0 - 40 mg/dL   LDL Cholesterol 95 0 - 99 mg/dL    Comment:        Total Cholesterol/HDL:CHD Risk Coronary Heart Disease Risk Table                     Men   Women  1/2 Average Risk   3.4   3.3  Average Risk       5.0   4.4  2 X Average Risk   9.6   7.1  3 X Average Risk  23.4   11.0        Use the calculated Patient Ratio above and the CHD Risk Table to determine the patient's CHD Risk.        ATP III CLASSIFICATION (LDL):  <100     mg/dL   Optimal  100-129  mg/dL   Near or Above                    Optimal  130-159  mg/dL   Borderline  160-189  mg/dL   High  >190     mg/dL   Very High     Current Facility-Administered Medications  Medication Dose Route Frequency Provider  Last Rate Last Dose  . acetaminophen (TYLENOL) tablet 650 mg  650 mg Oral Q6H PRN Lytle Butte, MD   650 mg at 03/01/15 0623  . albuterol (PROVENTIL) (2.5 MG/3ML) 0.083% nebulizer solution 2.5 mg  2.5 mg Inhalation Q6H WA Gladstone Lighter, MD      . ALPRAZolam Duanne Moron) tablet 0.125 mg  0.125 mg Oral Q48H PRN Gonzella Lex, MD      . ALPRAZolam Duanne Moron) tablet 0.25 mg  0.25 mg Oral Q6H PRN Gonzella Lex, MD      . aspirin tablet 325 mg  325 mg Oral Daily Lytle Butte, MD   325 mg at 03/01/15 0740  . atorvastatin (LIPITOR) tablet 40 mg  40 mg Oral q1800 Gladstone Lighter, MD   40 mg at 03/01/15 1742  . clopidogrel (PLAVIX) tablet 75 mg  75 mg Oral Daily Lytle Butte, MD   75 mg at 03/01/15 0741  . docusate sodium (COLACE) capsule 100 mg  100 mg Oral QHS Lytle Butte, MD   100 mg at 03/01/15 0015  . enoxaparin (LOVENOX) injection 40 mg  40 mg Subcutaneous Q24H Nicholes Mango, MD   40 mg at 02/28/15 2239  . ipratropium-albuterol (DUONEB) 0.5-2.5 (3) MG/3ML nebulizer solution 3 mL  3 mL Nebulization Q4H PRN Lytle Butte, MD      . lamoTRIgine (LAMICTAL) tablet 25 mg  25 mg Oral  QHS Lytle Butte, MD   25 mg at 03/01/15 0015  . levothyroxine (SYNTHROID, LEVOTHROID) tablet 88 mcg  88 mcg Oral QAC breakfast Lytle Butte, MD   88 mcg at 03/01/15 0742  . methylPREDNISolone sodium succinate (SOLU-MEDROL) 40 mg/mL injection 40 mg  40 mg Intravenous Q6H Gladstone Lighter, MD   40 mg at 03/01/15 1742  . mometasone-formoterol (DULERA) 100-5 MCG/ACT inhaler 2 puff  2 puff Inhalation BID Lytle Butte, MD   2 puff at 03/01/15 0743  . montelukast (SINGULAIR) tablet 10 mg  10 mg Oral QHS Lytle Butte, MD   10 mg at 03/01/15 0015  . nicotine (NICODERM CQ - dosed in mg/24 hours) patch 14 mg  14 mg Transdermal Daily Nicholes Mango, MD   14 mg at 03/01/15 0744  . nystatin (MYCOSTATIN) 100000 UNIT/ML suspension 500,000 Units  5 mL Oral PRN Lytle Butte, MD      . OLANZapine [facility]) tablet 2.5 mg  2.5 mg Oral QHS Lytle Butte, MD   2.5 mg at 03/01/15 0100  . oxyCODONE (Oxy IR/ROXICODONE) immediate release tablet 5 mg  5 mg Oral Q4H PRN Lytle Butte, MD      . roflumilast [facility]) tablet 500 mcg  500 mcg Oral Daily Lytle Butte, MD   500 mcg at 03/01/15 [phone]  . tiotropium (SPIRIVA) inhalation capsule 18 mcg  18 mcg Inhalation Daily Lytle Butte, MD   18 mcg at 03/01/15 0743  . venlafaxine XR (EFFEXOR-XR) 24 hr capsule 150 mg  150 mg Oral Daily Lytle Butte, MD   150 mg at 03/01/15 0740  . [START ON 03/02/2015] venlafaxine XR (EFFEXOR-XR) 24 hr capsule 37.5 mg  37.5 mg Oral Q breakfast Gonzella Lex, MD      . vitamin C (ASCORBIC ACID) tablet 1,000 mg  1,000 mg Oral Daily Lytle Butte, MD   1,000 mg at 03/01/15 0740  . vitamin E capsule 200 Units  200 Units Oral Daily Lytle Butte, MD   200 Units at 03/01/15 0741  .  zinc sulfate capsule 220 mg  220 mg Oral Daily Lytle Butte, MD   220 mg at 03/01/15 0017    Musculoskeletal: Strength & Muscle Tone: decreased Gait & Station: unsteady Patient leans: N/A  Psychiatric Specialty Exam: Review of Systems  HENT: Negative.   Eyes:  Negative.        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   Cardiovascular: Negative.   Gastrointestinal: Negative.   Musculoskeletal: Negative.   Skin: Negative.   Neurological: Positive for weakness.  Psychiatric/Behavioral: Positive for depression and suicidal ideas. Negative for hallucinations, memory loss and substance abuse. The patient is nervous/anxious and has insomnia.     Blood pressure 150/90, pulse 99, temperature 98.3 F (36.8 C), temperature source Oral, resp. rate 20, height _0  (1.575 m), weight 71.804 kg (158 lb 4.8 oz), SpO2 98 %.Body mass index is 28.95 kg/(m^2).  General Appearance: Casual  Eye Contact::  Fair  Speech:  Normal Rate  Volume:  Normal  Mood:  Depressed  Affect:  Depressed  Thought Process:  Linear  Orientation:  Full (Time, Place, and Person)  Thought Content:  Negative  Suicidal Thoughts:  Yes.  without intent/plan  Homicidal Thoughts:  No  Memory:  Immediate;   Good Recent;   Fair Remote;   Fair  Judgement:  Intact  Insight:  Fair  Psychomotor Activity:  Decreased  Concentration:  Fair  Recall:  [employer] of Knowledge:Fair  Language: Fair  Akathisia:  No  Handed:  Right  AIMS (if indicated):     Assets:  Communication Skills Desire for Improvement Financial Resources/Insurance Housing Social Support  ADL's:  Intact  Cognition: WNL  Sleep:      Treatment Plan Summary: Medication management and Plan 69 year old woman with a history of a stroke. Feeling more depressed. Multiple symptoms of major depression. Denies any suicidal intent or plan and has no history of suicide attempts in the past. Not currently psychotic. We discussed some changes that could be useful and I will increase her Effexor up to the dose she quoted previously of 187.5 mg per day. Also order that her Xanax tablets can be broken down into much smaller doses to be used as an as needed medicine. I see the Zyprexa has been  ordered for tonight. She should be encouraged to find an outpatient psychiatrist locally or in one of the nearby cities when she is discharged. I will check up on her tomorrow. Supportive therapy done.  Disposition: No evidence of imminent risk to self or others at present.   Patient does not meet criteria for psychiatric inpatient admission. Supportive therapy provided about ongoing stressors. Discussed crisis plan, support from social network, calling 911, coming to the Emergency Department, and calling Suicide Hotline.  John Clapacs 03/01/2015 6:16 PM

## 2015-03-01 NOTE — Consult Note (Signed)
CC: [facility]  HPI: Shannon Wilkerson is an 69 y.o. female with a known history of 3 TIAs in the past, hypertension, anxiety and COPD is presenting to the ED with a chief complaint of blurry vision on the left eye. [facility]    Past Medical History  Diagnosis Date  . COPD (chronic obstructive pulmonary disease) (HCC)   . Hypertension   . Anxiety   . Asthma   . Shortness of breath dyspnea     exertion  . Anemia     past hx  . TIA (transient ischemic attack)     vision issues  . Neck stiffness     limited movement s/p fusion c4-c5, c5-c6    Past Surgical History  Procedure Laterality Date  . Cervical fusion  1973, 1999    C4-5-6, C4-5  . Cardiac catheterization      "many yrs ago"  . Tonsillectomy  1956  . Dilation and curettage of uterus  1999  . Cataract extraction w/phaco Left 01/17/2015    Procedure: CATARACT EXTRACTION PHACO AND INTRAOCULAR LENS PLACEMENT (IOC);  Surgeon: Sherald HessAnita Prakash Vin-Parikh, MD;  [facility];  Service: Ophthalmology;  Laterality: Left;    Family History  Problem Relation Age of Onset  . Stroke Mother   . CAD Mother   . CAD Father   . Alcohol abuse Brother     Social History:  reports that she has been smoking Cigarettes.  She has smoked for the past 50 years. She has never used smokeless tobacco. She reports that she drinks alcohol. She reports that she does not use illicit drugs.  Allergies  Allergen Reactions  . Fluvirin [Flu Virus Vaccine] Diarrhea  . Penicillins Hives and Other (See Comments)    Has patient had a PCN reaction causing immediate rash, facial/tongue/throat swelling, SOB or lightheadedness with hypotension: No Has patient had a PCN reaction causing severe rash involving mucus membranes or skin necrosis: No Has patient had a PCN reaction that required hospitalization No Has patient had a PCN reaction occurring within the last 10 years: No If all of the above answers are "NO", then may  proceed with Cephalosporin use.  . Tetanus Toxoids Diarrhea    Medications: I have reviewed the patient's current medications.  ROS: History obtained from the patient  General ROS: negative for - chills, fatigue, fever, night sweats, weight gain or weight loss Psychological ROS: positive for anxiety  Ophthalmic ROS: L eye blurry vision  ENT ROS: negative for - epistaxis, nasal discharge, oral lesions, sore throat, tinnitus or vertigo Allergy and Immunology ROS: negative for - hives or itchy/watery eyes Hematological and Lymphatic ROS: negative for - bleeding problems, bruising or swollen lymph nodes Endocrine ROS: negative for - galactorrhea, hair pattern changes, polydipsia/polyuria or temperature intolerance Respiratory ROS: negative for - cough, hemoptysis, shortness of breath or wheezing Cardiovascular ROS: negative for - chest pain, dyspnea on exertion, edema or irregular heartbeat Gastrointestinal ROS: negative for - abdominal pain, diarrhea, hematemesis, nausea/vomiting or stool incontinence Genito-Urinary ROS: negative for - dysuria, hematuria, incontinence or urinary frequency/urgency Musculoskeletal ROS: negative for - joint swelling or muscular weakness Neurological ROS: as noted in HPI Dermatological ROS: negative for rash and skin lesion changes  Physical Examination: Blood pressure 169/93, pulse 102, temperature 97.7 F (36.5 C), temperature source Oral, resp. rate 20, height 5\' 2"  (1.575 m), weight 158 lb 4.8 oz (71.804 kg), SpO2 96 %.    Neurological Examination Mental Status:  Alert, oriented, thought content appropriate.  Speech fluent without evidence of aphasia.  Able to follow 3 step commands without difficulty. Cranial Nerves: II: Discs flat bilaterally; Visual fields grossly normal, pupils equal, round, reactive to light and accommodation III,IV, VI: [facility] V,VII: smile symmetric, facial light touch sensation normal bilaterally VIII: hearing normal  bilaterally IX,X: gag reflex present XI: bilateral shoulder shrug XII: midline tongue extension Motor: Right : Upper extremity   5/5    Left:     Upper extremity   5/5  Lower extremity   4+/5     Lower extremity   4=/5 Tone and bulk:normal tone throughout; no atrophy noted Sensory: Pinprick and light touch intact throughout, bilaterally Deep Tendon Reflexes: 1+ and symmetric throughout Plantars: Right: downgoing   Left: downgoing Cerebellar: normal finger-to-nose, normal rapid alternating movements and normal heel-to-shin test Gait: not tested       Laboratory Studies:   Basic Metabolic Panel:  Recent Labs Lab 02/28/15 1800  NA 142  K 4.5  CL 94*  CO2 40*  GLUCOSE 86  BUN 9  CREATININE 0.39*  CALCIUM 9.5    Liver Function Tests:  Recent Labs Lab 02/28/15 1800  AST 17  ALT 13*  ALKPHOS 57  BILITOT 0.4  PROT 7.0  ALBUMIN 4.0   No results for input(s): LIPASE, AMYLASE in the last 168 hours. No results for input(s): AMMONIA in the last 168 hours.  CBC:  Recent Labs Lab 02/28/15 1800  WBC 5.3  NEUTROABS 3.6  HGB 12.0  HCT 37.6  MCV 85.9  PLT 146*    Cardiac Enzymes: No results for input(s): CKTOTAL, CKMB, CKMBINDEX, TROPONINI in the last 168 hours.  BNP: Invalid input(s): POCBNP  CBG:  Recent Labs Lab 02/28/15 1850  GLUCAP 84    Microbiology: Results for orders placed or performed during the hospital encounter of 11/30/14  Culture, routine-abscess     Status: None   Collection Time: 11/30/14  4:06 PM  Result Value Ref Range Status   Specimen Description FLUID  Final   Special Requests Normal  Final   Report Status 01/18/2015 FINAL  Final  Body fluid culture     Status: None   Collection Time: 11/30/14  4:06 PM  Result Value Ref Range Status   Specimen Description FLUID  Final   Special Requests Normal  Final   Gram Stain MODERATE WBC SEEN NO ORGANISMS SEEN   Final   Culture NO GROWTH 4 DAYS  Final   Report Status 12/04/2014  FINAL  Final    Coagulation Studies:  Recent Labs  02/28/15 1800  LABPROT 12.5  INR 0.91    Urinalysis: No results for input(s): COLORURINE, LABSPEC, PHURINE, GLUCOSEU, HGBUR, BILIRUBINUR, KETONESUR, PROTEINUR, UROBILINOGEN, NITRITE, LEUKOCYTESUR in the last 168 hours.  Invalid input(s): APPERANCEUR  Lipid Panel:     Component Value Date/Time   CHOL 182 03/01/2015 0620   TRIG 136 03/01/2015 0620   HDL 60 03/01/2015 0620   CHOLHDL 3.0 03/01/2015 0620   VLDL 27 03/01/2015 0620   LDLCALC 95 03/01/2015 0620    HgbA1C:  Lab Results  Component Value Date   HGBA1C 5.4 10/28/2014    Urine Drug Screen:  No results found for: LABOPIA, COCAINSCRNUR, LABBENZ, AMPHETMU, THCU, LABBARB  Alcohol Level: No results for input(s): ETH in the last 168 hours.    Imaging: Ct Head Wo Contrast  02/28/2015  CLINICAL DATA:  Left eye visual disturbance, code stroke EXAM: CT HEAD WITHOUT CONTRAST TECHNIQUE: Contiguous axial images  were obtained from the base of the skull through the vertex without contrast. COMPARISON:  10/27/2014 FINDINGS: Age related brain atrophy and mild chronic white matter microvascular change throughout the cerebral hemispheres. No acute intracranial hemorrhage, mass lesion, definite infarction, midline shift, herniation, hydrocephalus, or extra-axial fluid collection. No focal mass effect or edema. Cisterns patent. No gross cerebellar abnormality. Mastoids and visualized sinuses remain clear. Orbits appear symmetric. IMPRESSION: Stable exam.  No acute intracranial process by noncontrast CT. These results were called by telephone at the time of interpretation on 02/28/2015 at 5:47 pm to Dr. Gladstone Pih , who verbally acknowledged these results. Electronically Signed   By: Judie Petit.  Shick M.D.   On: 02/28/2015 17:48   US Carotid Bilateral  03/01/2015  CLINICAL DATA:  TIA.  Syncope, visual disturbance. EXAM: BILATERAL CAROTID DUPLEX ULTRASOUND TECHNIQUE: Wallace Cullens scale imaging, color  Doppler and duplex ultrasound was performed of bilateral carotid and vertebral arteries in the neck. COMPARISON:  10/27/2013 REVIEW OF SYSTEMS: Quantification of carotid stenosis is based on velocity parameters that correlate the residual internal carotid diameter with NASCET-based stenosis levels, using the diameter of the distal internal carotid lumen as the denominator for stenosis measurement. The following velocity measurements were obtained: PEAK SYSTOLIC/END DIASTOLIC RIGHT ICA:                     87/26cm/sec CCA:                     69/17cm/sec SYSTOLIC ICA/CCA RATIO:  1.3 DIASTOLIC ICA/CCA RATIO: 1.5 ECA:                     98cm/sec LEFT ICA:                     79/31cm/sec CCA:                     63/20cm/sec SYSTOLIC ICA/CCA RATIO:  1.3 DIASTOLIC ICA/CCA RATIO: 1.5 ECA:                     104cm/sec FINDINGS: RIGHT CAROTID ARTERY: Eccentric partially calcified plaque in the bulb and at the origin of the ICA. No high-grade stenosis. Normal waveforms and color Doppler signal. RIGHT VERTEBRAL ARTERY:  Normal flow direction and waveform. LEFT CAROTID ARTERY: Mild smooth plaque in the carotid bulb with scattered calcifications. This extends to the origin of the ICA. No high-grade stenosis. Normal waveforms and color Doppler signal. LEFT VERTEBRAL ARTERY: Normal flow direction and waveform. IMPRESSION: 1. Bilateral mild carotid bifurcation and proximal ICA plaque, resulting in less than 50% diameter stenosis. No significant progression since previous study. The exam does not exclude plaque ulceration or embolization. Continued surveillance recommended. Electronically Signed   By: Corlis Leak M.D.   On: 03/01/2015 09:48     Assessment/Plan:  69 y.o. female with a known history of 3 TIAs in the past, hypertension, anxiety and COPD is presenting to the ED with a chief complaint of blurry vision on the left eye. Pt appears to have [facility] on examination.    - Suspect R occipital stroke - Pt refused MRI brain   - s/p ultrasound - con't plavix and asa - pt appears to have a lot of anxiety  - obtaining CTA would not change my management as already on dual anti platelet therapy - out pt referral to cardiology for loop - d/c planning  - no driving until sees neurology as  out pt due to loss of peripheral vision.  03/01/2015, 9:55 AM

## 2015-03-01 NOTE — Progress Notes (Signed)
[facility] [place] [facility]   PATIENT NAME: Shannon Wilkerson    MR#:  [id number]  DATE OF BIRTH:  [birth year]  SUBJECTIVE:  CHIEF COMPLAINT:   Chief Complaint  Patient presents with  . Code Stroke   - Patient admitted with left  Eye blurred vision. Refuse to get MRI done today due to anxiety. Canceled the MRI. -Getting very dyspneic on minimal exertion. Couldn't work with physical therapy due to the same. Symptoms have been going on for the last week   REVIEW OF SYSTEMS:  Review of Systems  Constitutional: Negative for fever and chills.  HENT: Negative for ear discharge, ear pain and nosebleeds.   Eyes: Positive for blurred vision. Negative for photophobia.  Respiratory: Negative for cough, shortness of breath and wheezing.   Cardiovascular: Negative for chest pain, palpitations and leg swelling.  Gastrointestinal: Negative for nausea, vomiting, abdominal pain, diarrhea and constipation.  Genitourinary: Negative for dysuria.  Neurological: Negative for dizziness, sensory change, speech change, focal weakness, seizures and headaches.  Psychiatric/Behavioral: Negative for depression. The patient is nervous/anxious.     DRUG ALLERGIES:   Allergies  Allergen Reactions  . Fluvirin [Flu Virus Vaccine] Diarrhea  . Penicillins Hives and Other (See Comments)    Has patient had a PCN reaction causing immediate rash, facial/tongue/throat swelling, SOB or lightheadedness with hypotension: No Has patient had a PCN reaction causing severe rash involving mucus membranes or skin necrosis: No Has patient had a PCN reaction that required hospitalization No Has patient had a PCN reaction occurring within the last 10 years: No If all of the above answers are "NO", then may proceed with Cephalosporin use.  . Tetanus Toxoids Diarrhea    VITALS:  Blood pressure 150/90, pulse 99, temperature 98.3 F (36.8 C), temperature source Oral, resp. rate 20, height  (1.575  m), weight 71.804 kg (158 lb 4.8 oz), SpO2 98 %.  PHYSICAL EXAMINATION:  Physical Exam  GENERAL:  69 y.o.-year-old patient lying in the bed with no acute distress.  EYES: Pupils equal, round, reactive to light and accommodation. No scleral icterus. Extraocular muscles intact.  HEENT: Head atraumatic, normocephalic. Oropharynx and nasopharynx clear.  NECK:  Supple, no jugular venous distention. No thyroid enlargement, no tenderness.  LUNGS: Very scant breath sounds bilaterally, tight on auscultation. no wheezing, rales,rhonchi or crepitation. use of accessory muscles of respiration noted with minimal exertion.  CARDIOVASCULAR: S1, S2 normal. No murmurs, rubs, or gallops.  ABDOMEN: Soft, nontender, nondistended. Bowel sounds present. No organomegaly or mass.  EXTREMITIES: No pedal edema, cyanosis, or clubbing.  NEUROLOGIC: Cranial nerves II through XII with decreased visual acuity, and other cranial nerves being normal, Muscle strength 5/5 in all extremities. Sensation intact. Gait limited due to dyspnea.  PSYCHIATRIC: The patient is alert and oriented x 3.  SKIN: No obvious rash, lesion, or ulcer.    LABORATORY PANEL:   CBC  Recent Labs Lab 02/28/15 1800  WBC 5.3  HGB 12.0  HCT 37.6  PLT 146*   ------------------------------------------------------------------------------------------------------------------  Chemistries   Recent Labs Lab 02/28/15 1800  NA 142  K 4.5  CL 94*  CO2 40*  GLUCOSE 86  BUN 9  CREATININE 0.39*  CALCIUM 9.5  AST 17  ALT 13*  ALKPHOS 57  BILITOT 0.4   ------------------------------------------------------------------------------------------------------------------  Cardiac Enzymes No results for input(s): TROPONINI in the last 168 hours. ------------------------------------------------------------------------------------------------------------------  RADIOLOGY:  Ct Head Wo Contrast  02/28/2015  CLINICAL DATA:  Left eye visual  disturbance, code  stroke EXAM: CT HEAD WITHOUT CONTRAST TECHNIQUE: Contiguous axial images were obtained from the base of the skull through the vertex without contrast. COMPARISON:  10/27/2014 FINDINGS: Age related brain atrophy and mild chronic white matter microvascular change throughout the cerebral hemispheres. No acute intracranial hemorrhage, mass lesion, definite infarction, midline shift, herniation, hydrocephalus, or extra-axial fluid collection. No focal mass effect or edema. Cisterns patent. No gross cerebellar abnormality. Mastoids and visualized sinuses remain clear. Orbits appear symmetric. IMPRESSION: Stable exam.  No acute intracranial process by noncontrast CT. These results were called by telephone at the time of interpretation on 02/28/2015 at 5:47 pm to Dr. Gladstone Pih , who verbally acknowledged these results. Electronically Signed   By: Judie Petit.  Shick M.D.   On: 02/28/2015 17:48   US Carotid Bilateral  03/01/2015  CLINICAL DATA:  TIA.  Syncope, visual disturbance. EXAM: BILATERAL CAROTID DUPLEX ULTRASOUND TECHNIQUE: Wallace Cullens scale imaging, color Doppler and duplex ultrasound was performed of bilateral carotid and vertebral arteries in the neck. COMPARISON:  10/27/2013 REVIEW OF SYSTEMS: Quantification of carotid stenosis is based on velocity parameters that correlate the residual internal carotid diameter with NASCET-based stenosis levels, using the diameter of the distal internal carotid lumen as the denominator for stenosis measurement. The following velocity measurements were obtained: PEAK SYSTOLIC/END DIASTOLIC RIGHT ICA:                     87/26cm/sec CCA:                     69/17cm/sec SYSTOLIC ICA/CCA RATIO:  1.3 DIASTOLIC ICA/CCA RATIO: 1.5 ECA:                     98cm/sec LEFT ICA:                     79/31cm/sec CCA:                     63/20cm/sec SYSTOLIC ICA/CCA RATIO:  1.3 DIASTOLIC ICA/CCA RATIO: 1.5 ECA:                     104cm/sec FINDINGS: RIGHT CAROTID ARTERY: Eccentric  partially calcified plaque in the bulb and at the origin of the ICA. No high-grade stenosis. Normal waveforms and color Doppler signal. RIGHT VERTEBRAL ARTERY:  Normal flow direction and waveform. LEFT CAROTID ARTERY: Mild smooth plaque in the carotid bulb with scattered calcifications. This extends to the origin of the ICA. No high-grade stenosis. Normal waveforms and color Doppler signal. LEFT VERTEBRAL ARTERY: Normal flow direction and waveform. IMPRESSION: 1. Bilateral mild carotid bifurcation and proximal ICA plaque, resulting in less than 50% diameter stenosis. No significant progression since previous study. The exam does not exclude plaque ulceration or embolization. Continued surveillance recommended. Electronically Signed   By: Corlis Leak M.D.   On: 03/01/2015 09:48    EKG:   Orders placed or performed during the hospital encounter of 02/28/15  . ED EKG  . ED EKG  . EKG 12-Lead  . EKG 12-Lead  . EKG 12-Lead  . EKG 12-Lead    ASSESSMENT AND PLAN:   69 year old female with past medical history significant for hypertension, COPD on 2.5 L home oxygen, anxiety prior history of TIAs in the past comes to the hospital secondary to worsening left eye vision.  #1 CVA-possible right occipital stroke with left hemianopsia. Patient refused MRI of the brain. Appreciate neurology consult. -Already on dual antiplatelet  treatment with aspirin and Plavix. -LDL less than 100, continue statin -Follow up carotid ultrasound and also echocardiogram. -Outpatient cardiology referral for loop monitor recommended. -Strongly recommended to quit smoking. -No driving advised due to loss of peripheral vision.  #2 acute on chronic COPD exacerbation-with significant dyspnea on exertion. -Start IV steroids, DuoNeb's and continue inhalers and monitor. -Currently on 3 L oxygen. -Continue her Singulair and daliresp.  #3 anxiety with panic attacks-likely worsening her breathing as well. -Continue Effexor, also  on Zyprexa and Lamictal -Psych consult requested  #4 DVT prophylaxis-on Lovenox  #5 hypothyroidism-continue Synthroid   All the records are reviewed and case discussed with Care Management/Social Workerr. Management plans discussed with the patient, family and they are in agreement.  CODE STATUS: Full code   TOTAL TIME TAKING CARE OF THIS PATIENT: 37  minutes.   POSSIBLE D/C IN 1-2  DAYS, DEPENDING ON CLINICAL CONDITION.   Enid BaasKALISETTI,Rapheal Masso M.D on 03/01/2015 at 3:57 PM  Between 7am to 6pm - Pager - [phone]  After 6pm go to [email or web address] - password [email or web address]  Office  [phone]  CC: Primary care physician; Alan MulderMORAYATI,SHAMIL J, MD

## 2015-03-01 NOTE — Care Management (Signed)
Admitted to [facility] with the diagnosis of CVA. Lives with husband, [name], [phone]). Last seen Dr. Patrecia PaceMorayati a couple of weeks ago. Home oxygen thru [facility] for several years. No home health. No skilled facility. Uses no aids for ambulation. Takes care of all basic activities of daily living herself, still drives as needed. Takes care of all her instrumental needs also. No falls. Good appetite until now. Husband will transport Shannon GreetBrenda S Ronon Ferger RN MSN CCM Care Management [phone]

## 2015-03-02 MED ORDER — VENLAFAXINE HCL ER 37.5 MG PO CP24: 37.5000 mg | ORAL_CAPSULE | Freq: Every day | ORAL | Status: DC

## 2015-03-02 MED ORDER — VENLAFAXINE HCL ER 150 MG PO CP24: 150.0000 mg | ORAL_CAPSULE | Freq: Every day | ORAL | Status: DC

## 2015-03-02 MED ORDER — NICOTINE 14 MG/24HR TD PT24: 14.0000 mg | MEDICATED_PATCH | Freq: Every day | TRANSDERMAL | Status: DC

## 2015-03-02 MED ORDER — PREDNISONE 10 MG (21) PO TBPK: 10.0000 mg | ORAL_TABLET | Freq: Every day | ORAL | Status: DC

## 2015-03-02 MED ORDER — METHYLPREDNISOLONE SODIUM SUCC 40 MG IJ SOLR: 40.0000 mg | Freq: Once | INTRAMUSCULAR | Status: DC

## 2015-03-02 MED ORDER — ACETAMINOPHEN 325 MG PO TABS: 650.0000 mg | ORAL_TABLET | Freq: Four times a day (QID) | ORAL | Status: AC | PRN

## 2015-03-02 MED ORDER — ALPRAZOLAM 0.25 MG PO TABS: 0.2500 mg | ORAL_TABLET | Freq: Four times a day (QID) | ORAL | Status: DC | PRN

## 2015-03-02 NOTE — Progress Notes (Signed)
Pt to be dischareged home. On home o2 at 2 l [place]. Dyspnea on exertion  sats  Maintain while amb with o2/

## 2015-03-02 NOTE — Progress Notes (Signed)
Pt for discharge home no resp distress. At rest . Pt gets dyspnic on increased exertion/ 02 in use at 2 l [place].  amb in hall with walker and p.t. tol we//.  sats maintained ok.  Discharge  Instructions discussed with pt. meds discussed. presc given and discussed. Diet / activity and  F/u  Discussed. Verbalized understanding/  Sl d/cd.  Waiting on husband  To come  Pick her up.  Rolling walker and bsc  Provided for pt.

## 2015-03-02 NOTE — Plan of Care (Addendum)
Problem: Anxiety Goal: Ability to demonstrate positive interaction with newborn will improve Outcome: Not Progressing Patient continues to vocalize helplessness and wanting "to die".  Patient given Xanax .25mg  every 6 hours with anxiety resolving upon reassessment.  However, will get aggitated and upset when she has not received medication.  Problem: Safety: Goal: Ability to remain free from injury will improve Outcome: Progressing High Fall Risk. Patient refuses bed alarm - education provided.   Problem: Pain Managment: Goal: General experience of comfort will improve Outcome: Progressing No complaints of pain this shift.   .Marland Kitchen

## 2015-03-02 NOTE — Plan of Care (Signed)
Problem: Anxiety Goal: Ability to demonstrate positive interaction with newborn will improve Outcome: Adequate for Discharge Pt takes xanax at home.   Pt reports  Xanax helps "after just 10 min."

## 2015-03-02 NOTE — Discharge Instructions (Signed)
Discharge home with home PT Activity as tolerated as recommended by physical therapy. Instructed not to drive until seen and cleared by neurology Diet-cardiac Follow-up with primary care physician in a week Follow-up with cardiology in 3-5 days for Loop monitor  follow-up with neurology in 2 weeks

## 2015-03-02 NOTE — Progress Notes (Signed)
Physical Therapy Treatment Patient Details Name: Luisa HartJoann S Onken MRN: [id number] DOB: 05/21/1945 Today's Date: 03/02/2015    History of Present Illness Pt is a 69 y.o. female presenting to hospital with L eye blurry vision (L hemianopia).  CT of head negative for acute intracranial abnormality.  Pt unable to tolerate MRI.  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  Pt is on chronic home O2 (2 L/min at rest; 2.5 L/min with activity).  PMH includes h/o 3 TIA's, htn, anxiety, COPD, C4-6 fusion.    PT Comments    Pt initially found in room up with O2 long tubing tangled up, somewhat under pt's feet and pt attempting to fix. Pt anxious due to a need to attend to personal hygiene/bathroom and reports difficulty breathing. O2 saturation at 95% at that time, but heart rate at 125 beats per minute. Pt notes her heart rate at home runs 99 to low 100's. Pt expresses anxiety about being in the hospital. Pt safely seated with rest time before progressing further. Heart rate mildly decreased to 118; pt ambulation without assistive device to bathroom; it is noted that pt ambulates on and over cord several times; she states at home she "always" rolls it up and holds onto cord. Pt demonstrates poor safety on return to bed from bathroom as well; pt also furniture walks without assistive device. Spoke to care management about this concern, as pt has times she needs to ambulate down the hall to bathroom at night; bedside commode and or portable O2 would offer much improved safety. Pt notes visual deficits of constant blurry vision and gray steaks making ambulation difficult with occasional dizziness due to vision. Pt  demonstrates much improved quality and safety with ambulation with rolling walker. Pt educated as well on safety and assist when symptoms inhibit ability to walk. Pt demonstrates ability to perform 4 steps with cues  for step to pattern for safety and heart rate control; pt understands. Pt has 5 steps to enter and full set in home that she does not need to perform, as she has a bed/bath on first level. Pt educated on energy conservation with steps as well as fall prevention/safety. Recommend home with home health and 24 hour supervision with rolling walker and bedside commode for optimal safety. All recommendations/session discussed with care management.   Follow Up Recommendations  Supervision/Assistance - 24 hour;Home health PT     Equipment Recommendations  Rolling walker with 5" wheels (bedside commode)    Recommendations for Other Services       Precautions / Restrictions Restrictions Weight Bearing Restrictions: No    Mobility  Bed Mobility Overal bed mobility: Independent             General bed mobility comments: Supine to/from sit with HOB elevated  Transfers Overall transfer level: Needs assistance Equipment used: None Transfers: Sit to/from Stand Sit to Stand: Supervision         General transfer comment: Pt transfering and ambulating to bathroom independently, but notes to be shakey, anxious and furniture walking. Pt requires supervision for safety.  Ambulation/Gait Ambulation/Gait assistance: Supervision Ambulation Distance (Feet): 25 Feet (initially to bathroom, 2nd walk with rw 78 feet; 3rd w/ rw30) Assistive device: None;Rolling walker (2 wheeled) Gait Pattern/deviations: Trunk flexed (trunk flexed without rw; good posture with rw) Gait velocity: decreased Gait velocity interpretation: Below normal speed for age/gender General Gait Details: Ambulation improved with rw; O2  saturation 96-97% on 2L, HR 125 initially, between 120 to 129 throughout session.   Stairs Stairs: Yes Stairs assistance: Min guard (cues for step to pattern to control heart rate) Stair Management: Two rails Number of Stairs: 4 General stair comments: Initiated stair climbing once heart rate  improved post second walk from 129 to 121 beats per minute; heart rate post steps 124 beats per minute. Good compliance with step to with 2 rails without issue.   Wheelchair Mobility    Modified Rankin (Stroke Patients Only)       Balance           Standing balance support: Bilateral upper extremity supported Standing balance-Leahy Scale: Fair (fair +) Standing balance comment: much improved posture and steadiness with ambulation with rw                    Cognition Arousal/Alertness: Awake/alert Behavior During Therapy: Anxious Overall Cognitive Status: Within Functional Limits for tasks assessed                      Exercises      General Comments        Pertinent Vitals/Pain Pain Assessment: No/denies pain    Home Living                      Prior Function            PT Goals (current goals can now be found in the care plan section) Progress towards PT goals: Progressing toward goals    Frequency  7X/week    PT Plan Current plan remains appropriate    Co-evaluation             End of Session Equipment Utilized During Treatment: Gait belt;Oxygen Activity Tolerance: Other (comment) (limited by anxiety and vision deficits) Patient left: in bed;with call bell/phone within reach     Time: 1124-1216 PT Time Calculation (min) (ACUTE ONLY): 52 min  Charges:  $Gait Training: 38-52 mins                    G Codes:      Kristeen Miss 03/02/2015, 12:33 PM

## 2015-03-02 NOTE — Discharge Summary (Signed)
[facility] [place] at [facility]   PATIENT NAME: Shannon Wilkerson    MR#:  [id number]  DATE OF BIRTH:  1946-04-01  DATE OF ADMISSION:  02/28/2015 ADMITTING PHYSICIAN: Ramonita Lab, MD  DATE OF DISCHARGE: 03/02/2015  PRIMARY CARE PHYSICIAN: Alan Mulder, MD    ADMISSION DIAGNOSIS:  TIA (transient ischemic attack) [G45.9] Cerebral infarction due to unspecified mechanism [I63.9]  DISCHARGE DIAGNOSIS:  Principal Problem:   Major depression (HCC) Active Problems:   CVA (cerebral infarction)   Generalized anxiety disorder  acute exacerbation of COPD  SECONDARY DIAGNOSIS:   Past Medical History  Diagnosis Date  . COPD (chronic obstructive pulmonary disease) (HCC)   . Hypertension   . Anxiety   . Asthma   . Shortness of breath dyspnea     exertion  . Anemia     past hx  . TIA (transient ischemic attack)     vision issues  . Neck stiffness     limited movement s/p fusion c4-c5, c5-c6    HOSPITAL COURSE:   69 year old female with past medical history significant for hypertension, COPD on 2.5 L home oxygen, anxiety prior history of TIAs in the past comes to the hospital secondary to worsening left eye vision.  #1 CVA-possible right occipital stroke with left hemianopsia.  Patient refused MRI of the brain. Appreciate neurology consult. -Already on dual antiplatelet treatment with aspirin and Plavix. -LDL 95 , plan is to continue statin -Follow up carotid ultrasound with less than 50% stenosis - echocardiogram nml with 50-55% LVEF -Outpatient cardiology referral for loop monitor recommended. -Strongly recommended to quit smoking. -No driving advised due to loss of peripheral vision until cleared by neurology  #2 acute on chronic COPD exacerbation-with significant dyspnea on exertion. -improved with  IV steroids, DuoNeb's and continue inhalers and monitor, d/c home with po steroids -Currently on 3 L oxygen. -Continue her Singulair and  daliresp.  #3 anxiety with panic attacks-likely worsening her breathing as well. -Continue Effexor at 187.5 mg per psych recs , also on Zyprexa and Lamictal -Psych recommended small doses of XANAX prn  -op psych f/u   #4 DVT prophylaxis-on Lovenox  #5 hypothyroidism-continue Synthroid    DISCHARGE CONDITIONS:   fair  CONSULTS OBTAINED:  Treatment Team:  Ramonita Lab, MD Pauletta Browns, MD Audery Amel, MD   PROCEDURES none  DRUG ALLERGIES:   Allergies  Allergen Reactions  . Fluvirin [Flu Virus Vaccine] Diarrhea  . Penicillins Hives and Other (See Comments)    Has patient had a PCN reaction causing immediate rash, facial/tongue/throat swelling, SOB or lightheadedness with hypotension: No Has patient had a PCN reaction causing severe rash involving mucus membranes or skin necrosis: No Has patient had a PCN reaction that required hospitalization No Has patient had a PCN reaction occurring within the last 10 years: No If all of the above answers are "NO", then may proceed with Cephalosporin use.  . Tetanus Toxoids Diarrhea    DISCHARGE MEDICATIONS:   Current Discharge Medication List    START taking these medications   Details  acetaminophen (TYLENOL) 325 MG tablet Take 2 tablets (650 mg total) by mouth every 6 (six) hours as needed for mild pain or fever.    nicotine (NICODERM CQ - DOSED IN MG/24 HOURS) 14 mg/24hr patch Place 1 patch (14 mg total) onto the skin daily. Qty: 28 patch, Refills: 0    predniSONE (STERAPRED UNI-PAK 21 TAB) 10 MG (21) TBPK tablet Take 1 tablet (10 mg total) by  mouth daily. Take 6 tablets by mouth for 1 day followed by  5 tablets by mouth for 1 day followed by  4 tablets by mouth for 1 day followed by  3 tablets by mouth for 1 day followed by  2 tablets by mouth for 1 day followed by  1 tablet by mouth for a day and stop Qty: 21 tablet, Refills: 0    !! venlafaxine XR (EFFEXOR-XR) 37.5 MG 24 hr capsule Take 1 capsule (37.5 mg total)  by mouth daily with breakfast. Qty: 30 capsule, Refills: 0     !! - Potential duplicate medications found. Please discuss with Chez Bulnes.    CONTINUE these medications which have CHANGED   Details  ALPRAZolam (XANAX) 0.25 MG tablet Take 1 tablet (0.25 mg total) by mouth every 6 (six) hours as needed for anxiety (Severe anxiety). Qty: 30 tablet, Refills: 0    !! venlafaxine XR (EFFEXOR-XR) 150 MG 24 hr capsule Take 1 capsule (150 mg total) by mouth daily. Qty: 30 capsule, Refills: 0     !! - Potential duplicate medications found. Please discuss with Dheeraj Hail.    CONTINUE these medications which have NOT CHANGED   Details  Ascorbic Acid (VITAMIN C) 1000 MG tablet Take 1,000 mg by mouth daily.    aspirin 325 MG tablet Take 1 tablet (325 mg total) by mouth daily. Qty: 30 tablet, Refills: 0    clopidogrel (PLAVIX) 75 MG tablet Take 75 mg by mouth daily.     diphenhydramine-acetaminophen (TYLENOL PM) 25-500 MG TABS Take 2 tablets by mouth at bedtime as needed (for sleep).    docusate sodium (COLACE) 100 MG capsule Take 100 mg by mouth at bedtime.    furosemide (LASIX) 20 MG tablet Take 20 mg by mouth daily as needed for edema.    ibuprofen (ADVIL,MOTRIN) 200 MG tablet Take 600 mg by mouth every 6 (six) hours as needed for mild pain.     ipratropium-albuterol (DUONEB) 0.5-2.5 (3) MG/3ML SOLN Take 3 mLs by nebulization every 4 (four) hours as needed (for shortness of breath).    lamoTRIgine (LAMICTAL) 25 MG tablet Take 25 mg by mouth at bedtime.    levalbuterol (XOPENEX HFA) 45 MCG/ACT inhaler Inhale 2 puffs into the lungs every 6 (six) hours as needed for wheezing or shortness of breath.     levothyroxine (SYNTHROID, LEVOTHROID) 88 MCG tablet Take 88 mcg by mouth daily.    mometasone-formoterol (DULERA) 100-5 MCG/ACT AERO Inhale 2 puffs into the lungs 2 (two) times daily.    montelukast (SINGULAIR) 10 MG tablet Take 10 mg by mouth at bedtime.    nystatin (MYCOSTATIN) 100000  UNIT/ML suspension Take 5 mLs by mouth as needed (for thrush).    OLANZapine (ZYPREXA) 2.5 MG tablet Take 2.5 mg by mouth at bedtime.    roflumilast (DALIRESP) 500 MCG TABS tablet Take 500 mcg by mouth daily.    simvastatin (ZOCOR) 20 MG tablet Take 20 mg by mouth at bedtime.    tiotropium (SPIRIVA) 18 MCG inhalation capsule Place 18 mcg into inhaler and inhale daily.    !! venlafaxine XR (EFFEXOR-XR) 150 MG 24 hr capsule Take 150 mg by mouth daily.    Vitamin D, Ergocalciferol, (DRISDOL) 50000 UNITS CAPS capsule Take 50,000 Units by mouth every 7 (seven) days. Pt takes on Sunday.    vitamin E 200 UNIT capsule Take 200 Units by mouth daily.    zinc gluconate 50 MG tablet Take 50 mg by mouth daily.    amLODipine (  NORVASC) 10 MG tablet Take 1 tablet (10 mg total) by mouth daily. Qty: 30 tablet, Refills: 0     !! - Potential duplicate medications found. Please discuss with Tyshaun Vinzant.    STOP taking these medications     doxycycline (VIBRAMYCIN) 100 MG capsule          DISCHARGE INSTRUCTIONS:   Discharge home with home PT Activity as tolerated as recommended by physical therapy. Instructed not to drive until seen and cleared by neurology Diet-cardiac Follow-up with primary care physician in a week Follow-up with cardiology in 3-5 days for Loop monitor  follow-up with neurology in 2 weeks Follow up with psychiatry in 1-2 weeks   DIET:  cardiac  DISCHARGE CONDITION:  fair ACTIVITY:  As tolerated as per PT recommendations  OXYGEN:  Home Oxygen: no   Oxygen Delivery: none  DISCHARGE LOCATION:  Home with home PT, continue home oxygen via nasal cannula   If you experience worsening of your admission symptoms, develop shortness of breath, life threatening emergency, suicidal or homicidal thoughts you must seek medical attention immediately by calling 911 or calling your MD immediately  if symptoms less severe.  You Must read complete instructions/literature along with  all the possible adverse reactions/side effects for all the Medicines you take and that have been prescribed to you. Take any new Medicines after you have completely understood and accpet all the possible adverse reactions/side effects.   Please note  You were cared for by a hospitalist during your hospital stay. If you have any questions about your discharge medications or the care you received while you were in the hospital after you are discharged, you can call the unit and asked to speak with the hospitalist on call if the hospitalist that took care of you is not available. Once you are discharged, your primary care physician will handle any further medical issues. Please note that NO REFILLS for any discharge medications will be authorized once you are discharged, as it is imperative that you return to your primary care physician (or establish a relationship with a primary care physician if you do not have one) for your aftercare needs so that they can reassess your need for medications and monitor your lab values.     Today  Chief Complaint  Patient presents with  . Code Stroke   Patients sob is better, feeling less anxious   ROS:  CONSTITUTIONAL: Denies fevers, chills. Denies any fatigue, weakness.  EYES: Denies blurry vision, double vision, eye pain. EARS, NOSE, THROAT: Denies tinnitus, ear pain, hearing loss. RESPIRATORY: Denies cough, wheeze, shortness of breath.  CARDIOVASCULAR: Denies chest pain, palpitations, edema.  GASTROINTESTINAL: Denies nausea, vomiting, diarrhea, abdominal pain. Denies bright red blood per rectum. GENITOURINARY: Denies dysuria, hematuria. ENDOCRINE: Denies nocturia or thyroid problems. HEMATOLOGIC AND LYMPHATIC: Denies easy bruising or bleeding. SKIN: Denies rash or lesion. MUSCULOSKELETAL: Denies pain in neck, back, shoulder, knees, hips or arthritic symptoms.  NEUROLOGIC: Denies paralysis, paresthesias.  PSYCHIATRIC: Denies anxiety or depressive  symptoms.   VITAL SIGNS:  Blood pressure 155/90, pulse 101, temperature 98.4 F (36.9 C), temperature source Oral, resp. rate 20, height  (1.575 m), weight 71.804 kg (158 lb 4.8 oz), SpO2 95 %.  I/O:   Intake/Output Summary (Last 24 hours) at 03/02/15 1113 Last data filed at 03/02/15 0900  Gross per 24 hour  Intake    240 ml  Output      0 ml  Net    240 ml  PHYSICAL EXAMINATION:  GENERAL:  69 y.o.-year-old patient lying in the bed with no acute distress.  EYES: Pupils equal, round, reactive to light and accommodation. No scleral icterus. Extraocular muscles intact.  HEENT: Head atraumatic, normocephalic. Oropharynx and nasopharynx clear.  NECK:  Supple, no jugular venous distention. No thyroid enlargement, no tenderness.  LUNGS: Normal breath sounds bilaterally, no wheezing, rales,rhonchi or crepitation. No use of accessory muscles of respiration.  CARDIOVASCULAR: S1, S2 normal. No murmurs, rubs, or gallops.  ABDOMEN: Soft, non-tender, non-distended. Bowel sounds present. No organomegaly or mass.  EXTREMITIES: No pedal edema, cyanosis, or clubbing.  NEUROLOGIC: Cranial nerves II through XII are intact. Muscle strength 5/5 in all extremities. Sensation intact. Gait not checked.  PSYCHIATRIC: The patient is alert and oriented x 3.  SKIN: No obvious rash, lesion, or ulcer.   DATA REVIEW:   CBC  Recent Labs Lab 02/28/15 1800  WBC 5.3  HGB 12.0  HCT 37.6  PLT 146*    Chemistries   Recent Labs Lab 02/28/15 1800  NA 142  K 4.5  CL 94*  CO2 40*  GLUCOSE 86  BUN 9  CREATININE 0.39*  CALCIUM 9.5  AST 17  ALT 13*  ALKPHOS 57  BILITOT 0.4    Cardiac Enzymes No results for input(s): TROPONINI in the last 168 hours.  Microbiology Results  Results for orders placed or performed during the hospital encounter of 11/30/14  Culture, routine-abscess     Status: None   Collection Time: 11/30/14  4:06 PM  Result Value Ref Range Status   Specimen Description  FLUID  Final   Special Requests Normal  Final   Report Status 01/18/2015 FINAL  Final  Body fluid culture     Status: None   Collection Time: 11/30/14  4:06 PM  Result Value Ref Range Status   Specimen Description FLUID  Final   Special Requests Normal  Final   Gram Stain MODERATE WBC SEEN NO ORGANISMS SEEN   Final   Culture NO GROWTH 4 DAYS  Final   Report Status 12/04/2014 FINAL  Final    RADIOLOGY:  Ct Head Wo Contrast  02/28/2015  CLINICAL DATA:  Left eye visual disturbance, code stroke EXAM: CT HEAD WITHOUT CONTRAST TECHNIQUE: Contiguous axial images were obtained from the base of the skull through the vertex without contrast. COMPARISON:  10/27/2014 FINDINGS: Age related brain atrophy and mild chronic white matter microvascular change throughout the cerebral hemispheres. No acute intracranial hemorrhage, mass lesion, definite infarction, midline shift, herniation, hydrocephalus, or extra-axial fluid collection. No focal mass effect or edema. Cisterns patent. No gross cerebellar abnormality. Mastoids and visualized sinuses remain clear. Orbits appear symmetric. IMPRESSION: Stable exam.  No acute intracranial process by noncontrast CT. These results were called by telephone at the time of interpretation on 02/28/2015 at 5:47 pm to Dr. Gladstone PihAVID SCHAEVITZ , who verbally acknowledged these results. Electronically Signed   By: Judie PetitM.  Shick M.D.   On: 02/28/2015 17:48   [place] Carotid Bilateral  03/01/2015  CLINICAL DATA:  TIA.  Syncope, visual disturbance. EXAM: BILATERAL CAROTID DUPLEX ULTRASOUND TECHNIQUE: Wallace CullensGray scale imaging, color Doppler and duplex ultrasound was performed of bilateral carotid and vertebral arteries in the neck. COMPARISON:  10/27/2013 REVIEW OF SYSTEMS: Quantification of carotid stenosis is based on velocity parameters that correlate the residual internal carotid diameter with NASCET-based stenosis levels, using the diameter of the distal internal carotid lumen as the denominator for  stenosis measurement. The following velocity measurements were obtained: PEAK SYSTOLIC/END DIASTOLIC RIGHT  ICA:                     87/26cm/sec CCA:                     69/17cm/sec SYSTOLIC ICA/CCA RATIO:  1.3 DIASTOLIC ICA/CCA RATIO: 1.5 ECA:                     98cm/sec LEFT ICA:                     79/31cm/sec CCA:                     63/20cm/sec SYSTOLIC ICA/CCA RATIO:  1.3 DIASTOLIC ICA/CCA RATIO: 1.5 ECA:                     104cm/sec FINDINGS: RIGHT CAROTID ARTERY: Eccentric partially calcified plaque in the bulb and at the origin of the ICA. No high-grade stenosis. Normal waveforms and color Doppler signal. RIGHT VERTEBRAL ARTERY:  Normal flow direction and waveform. LEFT CAROTID ARTERY: Mild smooth plaque in the carotid bulb with scattered calcifications. This extends to the origin of the ICA. No high-grade stenosis. Normal waveforms and color Doppler signal. LEFT VERTEBRAL ARTERY: Normal flow direction and waveform. IMPRESSION: 1. Bilateral mild carotid bifurcation and proximal ICA plaque, resulting in less than 50% diameter stenosis. No significant progression since previous study. The exam does not exclude plaque ulceration or embolization. Continued surveillance recommended. Electronically Signed   By: Corlis Leak M.D.   On: 03/01/2015 09:48    EKG:   Orders placed or performed during the hospital encounter of 02/28/15  . ED EKG  . ED EKG  . EKG 12-Lead  . EKG 12-Lead  . EKG 12-Lead  . EKG 12-Lead      Management plans discussed with the patient, family and they are in agreement.  CODE STATUS:     Code Status Orders        Start     Ordered   02/28/15 2137  Full code   Continuous     02/28/15 2136    Advance Directive Documentation        Most Recent Value   Type of Advance Directive  Healthcare Power of Attorney, Living will   Pre-existing out of facility DNR order (yellow form or pink MOST form)     "MOST" Form in Place?        TOTAL TIME TAKING CARE OF THIS  PATIENT: 42 minutes.    @  on 03/02/2015 at 11:13 AM  Between 7am to 6pm - Pager - [phone]  After 6pm go to [email or web address] - password [email or web address] [facility]  [facility]  Office  [phone]  CC: Primary care physician; Alan Mulder, MD

## 2015-03-02 NOTE — Care Management (Signed)
Discharge to home today per Dr. Amado CoeGouru. Spoke with Ms. Mcree at the bedside. Would like [facility] due to the fact [facility] supplies oxygen. Nursing staff will ambulate Shannon Wilkerson prior to discharge. Husband will transport. Gwenette GreetBrenda S Lamontae Ricardo RN MSN CCM Care Management [phone]

## 2015-11-20 IMAGING — CR DG ELBOW COMPLETE 3+V*L*
4 series · 4 of 4 positions shown · non-contrast
Comparison: None.

CLINICAL DATA: Acute left elbow swelling. No known injury. Initial
encounter.

EXAM:
LEFT ELBOW - COMPLETE 3+ VIEW

[elbow ap]
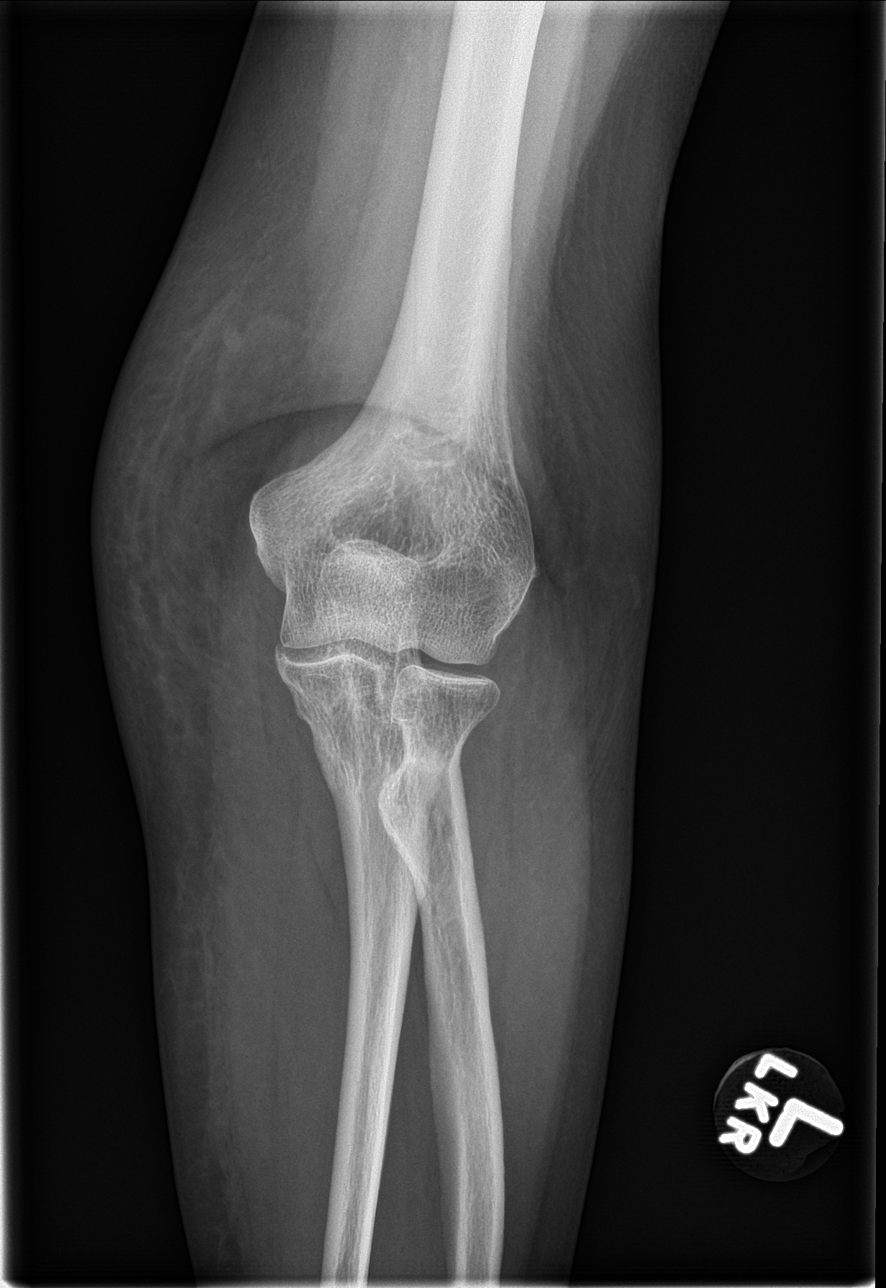

[elbow obl (1 of 2)]
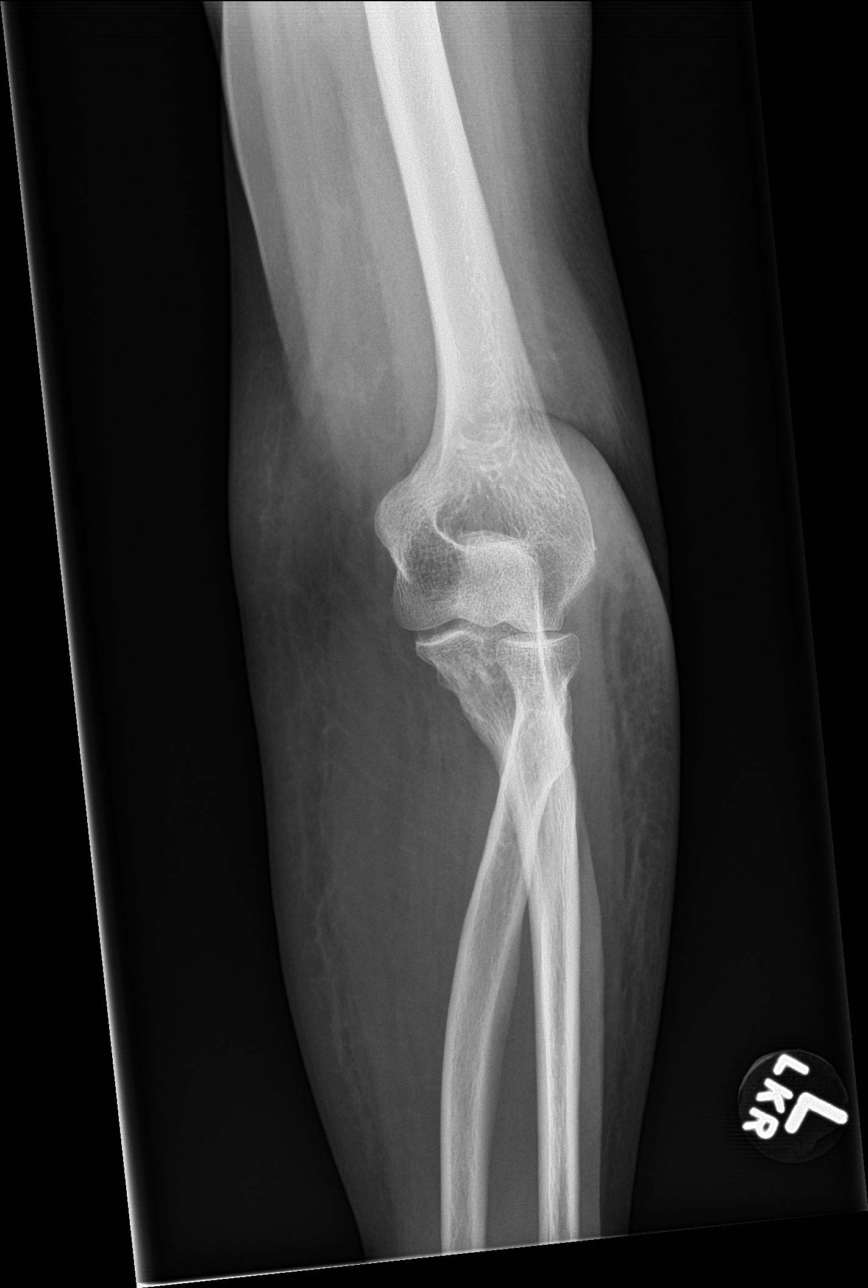

[elbow lat]
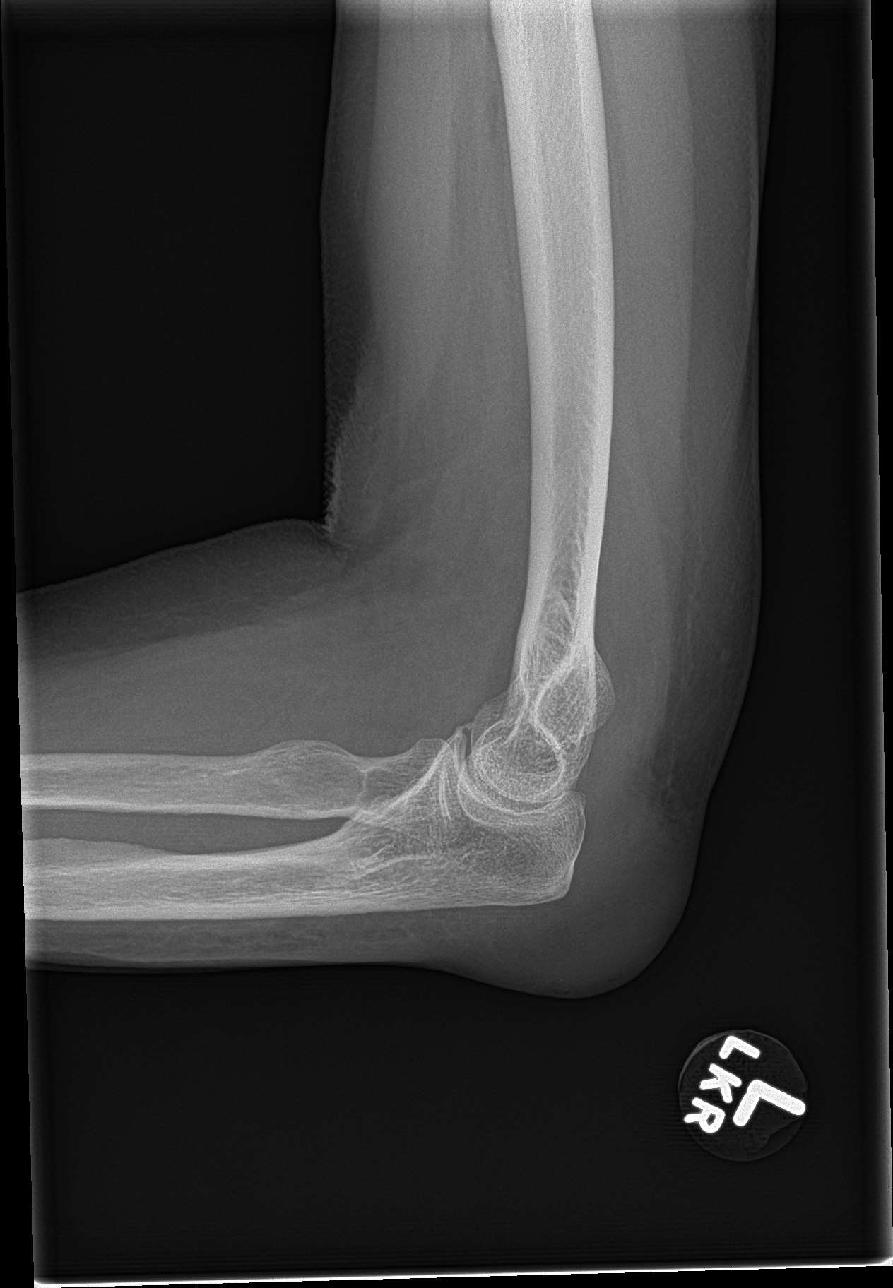

[elbow obl (2 of 2)]
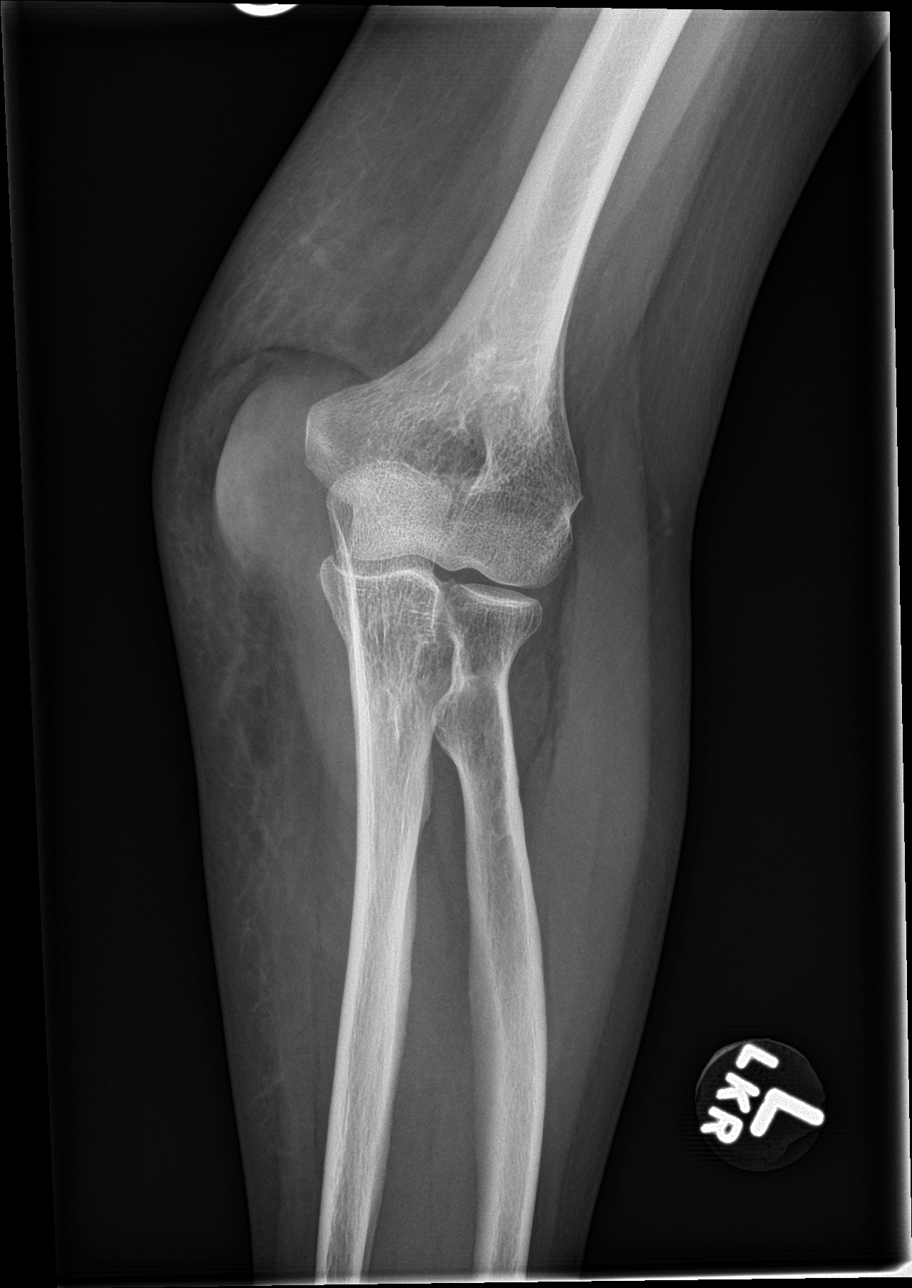

[4 of 4 positions shown; findings below may reference images not displayed]

FINDINGS: There is no evidence of fracture, dislocation, or joint effusion.
There is no evidence of arthropathy or other focal bone abnormality.
Soft tissue swelling is seen over the olecranon on suggesting
cellulitis. No radiopaque foreign body is noted.
IMPRESSION: No fracture or dislocation is noted. Soft tissue swelling is seen
over olecranon on suggesting cellulitis.

## 2016-02-18 DIAGNOSIS — H547 Unspecified visual loss: Secondary | ICD-10-CM | POA: Insufficient documentation

## 2016-02-18 DIAGNOSIS — I69398 Other sequelae of cerebral infarction: Secondary | ICD-10-CM

## 2016-08-05 ENCOUNTER — Emergency Department: Payer: Medicare Other

## 2016-08-05 ENCOUNTER — Inpatient Hospital Stay
Admission: EM | Admit: 2016-08-05 | Discharge: 2016-08-10 | DRG: 098 | Disposition: A | Discharge status: Home / Self Care | Payer: Medicare Other | Attending: Internal Medicine | Admitting: Internal Medicine

## 2016-08-05 DIAGNOSIS — Z8673 Personal history of transient ischemic attack (TIA), and cerebral infarction without residual deficits: Secondary | ICD-10-CM

## 2016-08-05 DIAGNOSIS — Z981 Arthrodesis status: Secondary | ICD-10-CM

## 2016-08-05 DIAGNOSIS — R4701 Aphasia: Secondary | ICD-10-CM | POA: Diagnosis present

## 2016-08-05 DIAGNOSIS — G0490 Encephalitis and encephalomyelitis, unspecified: Principal | ICD-10-CM | POA: Diagnosis present

## 2016-08-05 DIAGNOSIS — Z8249 Family history of ischemic heart disease and other diseases of the circulatory system: Secondary | ICD-10-CM

## 2016-08-05 DIAGNOSIS — Z811 Family history of alcohol abuse and dependence: Secondary | ICD-10-CM

## 2016-08-05 DIAGNOSIS — L03116 Cellulitis of left lower limb: Secondary | ICD-10-CM | POA: Diagnosis present

## 2016-08-05 DIAGNOSIS — Z887 Allergy status to serum and vaccine status: Secondary | ICD-10-CM

## 2016-08-05 DIAGNOSIS — I639 Cerebral infarction, unspecified: Secondary | ICD-10-CM | POA: Diagnosis not present

## 2016-08-05 DIAGNOSIS — Z823 Family history of stroke: Secondary | ICD-10-CM

## 2016-08-05 DIAGNOSIS — R509 Fever, unspecified: Secondary | ICD-10-CM

## 2016-08-05 DIAGNOSIS — F329 Major depressive disorder, single episode, unspecified: Secondary | ICD-10-CM | POA: Diagnosis present

## 2016-08-05 DIAGNOSIS — Z961 Presence of intraocular lens: Secondary | ICD-10-CM | POA: Diagnosis present

## 2016-08-05 DIAGNOSIS — F411 Generalized anxiety disorder: Secondary | ICD-10-CM | POA: Diagnosis present

## 2016-08-05 DIAGNOSIS — Z79899 Other long term (current) drug therapy: Secondary | ICD-10-CM

## 2016-08-05 DIAGNOSIS — Z9842 Cataract extraction status, left eye: Secondary | ICD-10-CM

## 2016-08-05 DIAGNOSIS — R4182 Altered mental status, unspecified: Secondary | ICD-10-CM

## 2016-08-05 DIAGNOSIS — R410 Disorientation, unspecified: Secondary | ICD-10-CM

## 2016-08-05 DIAGNOSIS — F17210 Nicotine dependence, cigarettes, uncomplicated: Secondary | ICD-10-CM | POA: Diagnosis present

## 2016-08-05 DIAGNOSIS — J449 Chronic obstructive pulmonary disease, unspecified: Secondary | ICD-10-CM | POA: Diagnosis present

## 2016-08-05 DIAGNOSIS — I10 Essential (primary) hypertension: Secondary | ICD-10-CM | POA: Diagnosis present

## 2016-08-05 DIAGNOSIS — Z66 Do not resuscitate: Secondary | ICD-10-CM | POA: Diagnosis present

## 2016-08-05 DIAGNOSIS — Z880 Allergy status to penicillin: Secondary | ICD-10-CM

## 2016-08-05 DIAGNOSIS — Z9889 Other specified postprocedural states: Secondary | ICD-10-CM

## 2016-08-05 DIAGNOSIS — R471 Dysarthria and anarthria: Secondary | ICD-10-CM | POA: Diagnosis present

## 2016-08-05 DIAGNOSIS — Z9981 Dependence on supplemental oxygen: Secondary | ICD-10-CM

## 2016-08-05 LAB — PROTIME-INR
INR: 0.86
Prothrombin Time: 11.7 seconds (ref 11.4–15.2)

## 2016-08-05 LAB — URINALYSIS, ROUTINE W REFLEX MICROSCOPIC
BILIRUBIN URINE: NEGATIVE
Bacteria, UA: NONE SEEN
GLUCOSE, UA: NEGATIVE mg/dL
KETONES UR: 20 mg/dL — AB
LEUKOCYTES UA: NEGATIVE
NITRITE: NEGATIVE
PROTEIN: 30 mg/dL — AB
Specific Gravity, Urine: 1.011 (ref 1.005–1.030)
Squamous Epithelial / LPF: NONE SEEN
pH: 8 (ref 5.0–8.0)

## 2016-08-05 LAB — COMPREHENSIVE METABOLIC PANEL
ALT: 14 U/L (ref 14–54)
AST: 20 U/L (ref 15–41)
Albumin: 4.5 g/dL (ref 3.5–5.0)
Alkaline Phosphatase: 64 U/L (ref 38–126)
Anion gap: 8 (ref 5–15)
BILIRUBIN TOTAL: 0.7 mg/dL (ref 0.3–1.2)
BUN: 10 mg/dL (ref 6–20)
CHLORIDE: 92 mmol/L — AB (ref 101–111)
CO2: 37 mmol/L — ABNORMAL HIGH (ref 22–32)
CREATININE: 0.38 mg/dL — AB (ref 0.44–1.00)
Calcium: 9.3 mg/dL (ref 8.9–10.3)
Glucose, Bld: 132 mg/dL — ABNORMAL HIGH (ref 65–99)
Potassium: 4.1 mmol/L (ref 3.5–5.1)
Sodium: 137 mmol/L (ref 135–145)
TOTAL PROTEIN: 7.7 g/dL (ref 6.5–8.1)

## 2016-08-05 LAB — DIFFERENTIAL
BASOS PCT: 0 %
Basophils Absolute: 0 10*3/uL (ref 0–0.1)
EOS ABS: 0 10*3/uL (ref 0–0.7)
Eosinophils Relative: 0 %
Lymphocytes Relative: 11 %
Lymphs Abs: 0.6 10*3/uL — ABNORMAL LOW (ref 1.0–3.6)
MONO ABS: 0.3 10*3/uL (ref 0.2–0.9)
Monocytes Relative: 5 %
NEUTROS ABS: 5.1 10*3/uL (ref 1.4–6.5)
Neutrophils Relative %: 84 %

## 2016-08-05 LAB — CBC
HEMATOCRIT: 40.9 % (ref 35.0–47.0)
Hemoglobin: 13.1 g/dL (ref 12.0–16.0)
MCH: 26.1 pg (ref 26.0–34.0)
MCHC: 32 g/dL (ref 32.0–36.0)
MCV: 81.6 fL (ref 80.0–100.0)
Platelets: 76 10*3/uL — ABNORMAL LOW (ref 150–440)
RBC: 5.01 MIL/uL (ref 3.80–5.20)
RDW: 15.9 % — AB (ref 11.5–14.5)
WBC: 6 10*3/uL (ref 3.6–11.0)

## 2016-08-05 LAB — URINE DRUG SCREEN, QUALITATIVE (ARMC ONLY)
Amphetamines, Ur Screen: NOT DETECTED
BARBITURATES, UR SCREEN: NOT DETECTED
Benzodiazepine, Ur Scrn: POSITIVE — AB
CANNABINOID 50 NG, UR ~~LOC~~: NOT DETECTED
Cocaine Metabolite,Ur ~~LOC~~: NOT DETECTED
MDMA (ECSTASY) UR SCREEN: NOT DETECTED
Methadone Scn, Ur: NOT DETECTED
Opiate, Ur Screen: NOT DETECTED
PHENCYCLIDINE (PCP) UR S: NOT DETECTED
TRICYCLIC, UR SCREEN: NOT DETECTED

## 2016-08-05 LAB — TROPONIN I

## 2016-08-05 LAB — ETHANOL

## 2016-08-05 LAB — APTT: aPTT: 30 seconds (ref 24–36)

## 2016-08-05 MED ORDER — ASPIRIN 81 MG PO CHEW
CHEWABLE_TABLET | ORAL | Status: AC
  Administered 2016-08-05: 324 mg via ORAL
  Filled 2016-08-05: qty 4

## 2016-08-05 MED ORDER — ASPIRIN 81 MG PO CHEW
324.0000 mg | CHEWABLE_TABLET | Freq: Once | ORAL | Status: AC
  Administered 2016-08-05: 324 mg via ORAL
  Filled 2016-08-05: qty 4

## 2016-08-05 MED ORDER — IOPAMIDOL (ISOVUE-370) INJECTION 76%
75.0000 mL | Freq: Once | INTRAVENOUS | Status: AC | PRN
  Administered 2016-08-05: 75 mL via INTRAVENOUS

## 2016-08-05 NOTE — ED Notes (Signed)
Pt was given 4 tablets of 25 mg metoprolol instead of 4 tablets of  aspirin.  EDP notified and no changes to POC.  Pt being closely monitored for changes to blood pressure and heart rate.

## 2016-08-05 NOTE — ED Triage Notes (Signed)
Pt came in via EMS with sudden onset headache at approx 4pm and pt unable to remember simple commands and things that she does on a daily basis.  Pt unable to remember simple words.  Pt has had sudden onset of hypertension this evening as well.  Pt having difficulty remembering at this time.

## 2016-08-05 NOTE — ED Provider Notes (Signed)
[facility] Emergency Department Provider Note  Time seen: 10:12 PM  I have reviewed the triage vital signs and the nursing notes.   HISTORY  Chief Complaint Hypertension and Altered Mental Status    HPI Shannon Wilkerson is a 71 y.o. female with a past medical history of anemia, asthma, COPD, TIA (visual deficits) retention, presents to the emergency department with difficulty speaking and confusion. According to the patient and family at approximate 4:30 PM they noted acute onset of the patient having difficulty speaking and expressing words. She appeared to be having significant trouble understanding what people were telling her. Family states this has never happened before. She has had a TIA in the past involving her vision but never anything involving her brain. Patient continues to have difficulty speaking with confusion and trouble getting out what she is trying to say. Denies any weakness or numbness of any arm or leg.  Past Medical History:  Diagnosis Date  . Anemia    past hx  . Anxiety   . Asthma   . COPD (chronic obstructive pulmonary disease) (HCC)   . Hypertension   . Neck stiffness    limited movement s/p fusion c4-c5, c5-c6  . Shortness of breath dyspnea    exertion  . TIA (transient ischemic attack)    vision issues    Patient Active Problem List   Diagnosis Date Noted  . Major depression 03/01/2015  . Generalized anxiety disorder 03/01/2015  . CVA (cerebral infarction) 10/27/2014  . COPD (chronic obstructive pulmonary disease) (HCC) 10/27/2014  . Chronic respiratory failure (HCC) 10/27/2014  . Anxiety 10/27/2014  . Hypertension 10/27/2014    Past Surgical History:  Procedure Laterality Date  . CARDIAC CATHETERIZATION     "many yrs ago"  . CATARACT EXTRACTION W/PHACO Left 01/17/2015   Procedure: CATARACT EXTRACTION PHACO AND INTRAOCULAR LENS PLACEMENT (IOC);  Surgeon: Sherald Hess, MD;  Location: [facility];   Service: Ophthalmology;  Laterality: Left;  . CERVICAL FUSION  1973, 1999   C4-5-6, C4-5  . DILATION AND CURETTAGE OF UTERUS  1999  . TONSILLECTOMY  1956    Prior to Admission medications   Medication Sig Start Date End Date Taking? Authorizing Provider  acetaminophen (TYLENOL) 325 MG tablet Take 2 tablets (650 mg total) by mouth every 6 (six) hours as needed for mild pain or fever. 03/02/15   Ramonita Lab, MD  ALPRAZolam (XANAX) 0.25 MG tablet Take 1 tablet (0.25 mg total) by mouth every 6 (six) hours as needed for anxiety (Severe anxiety). 03/02/15   Ramonita Lab, MD  amLODipine (NORVASC) 10 MG tablet Take 1 tablet (10 mg total) by mouth daily. Patient not taking: Reported on 02/28/2015 10/28/14   Enedina Finner, MD  Ascorbic Acid (VITAMIN C) 1000 MG tablet Take 1,000 mg by mouth daily.    Historical Provider, MD  aspirin 325 MG tablet Take 1 tablet (325 mg total) by mouth daily. 10/28/14   Enedina Finner, MD  clopidogrel (PLAVIX) 75 MG tablet Take 75 mg by mouth daily.     Historical Provider, MD  diphenhydramine-acetaminophen (TYLENOL PM) 25-500 MG TABS Take 2 tablets by mouth at bedtime as needed (for sleep).    Historical Provider, MD  docusate sodium (COLACE) 100 MG capsule Take 100 mg by mouth at bedtime.    Historical Provider, MD  furosemide (LASIX) 20 MG tablet Take 20 mg by mouth daily as needed for edema.    Historical Provider, MD  ibuprofen (ADVIL,MOTRIN) 200 MG  tablet Take 600 mg by mouth every 6 (six) hours as needed for mild pain.     Historical Provider, MD  ipratropium-albuterol (DUONEB) 0.5-2.5 (3) MG/3ML SOLN Take 3 mLs by nebulization every 4 (four) hours as needed (for shortness of breath).    Historical Provider, MD  lamoTRIgine (LAMICTAL) 25 MG tablet Take 25 mg by mouth at bedtime.    Historical Provider, MD  levalbuterol [facility] HFA) 45 MCG/ACT inhaler Inhale 2 puffs into the lungs every 6 (six) hours as needed for wheezing or shortness of breath.     Historical Provider, MD   levothyroxine (SYNTHROID, LEVOTHROID) 88 MCG tablet Take 88 mcg by mouth daily.    Historical Provider, MD  mometasone-formoterol (DULERA) 100-5 MCG/ACT AERO Inhale 2 puffs into the lungs 2 (two) times daily.    Historical Provider, MD  montelukast (SINGULAIR) 10 MG tablet Take 10 mg by mouth at bedtime.    Historical Provider, MD  nicotine (NICODERM CQ - DOSED IN MG/24 HOURS) 14 mg/24hr patch Place 1 patch (14 mg total) onto the skin daily. 03/02/15   Ramonita Lab, MD  nystatin (MYCOSTATIN) 100000 UNIT/ML suspension Take 5 mLs by mouth as needed (for thrush).    Historical Provider, MD  OLANZapine (ZYPREXA) 2.5 MG tablet Take 2.5 mg by mouth at bedtime.    Historical Provider, MD  predniSONE (STERAPRED UNI-PAK 21 TAB) 10 MG (21) TBPK tablet Take 1 tablet (10 mg total) by mouth daily. Take 6 tablets by mouth for 1 day followed by  5 tablets by mouth for 1 day followed by  4 tablets by mouth for 1 day followed by  3 tablets by mouth for 1 day followed by  2 tablets by mouth for 1 day followed by  1 tablet by mouth for a day and stop 03/02/15   Ramonita Lab, MD  roflumilast (DALIRESP) 500 MCG TABS tablet Take 500 mcg by mouth daily.    Historical Provider, MD  simvastatin (ZOCOR) 20 MG tablet Take 20 mg by mouth at bedtime.    Historical Provider, MD  tiotropium (SPIRIVA) 18 MCG inhalation capsule Place 18 mcg into inhaler and inhale daily.    Historical Provider, MD  venlafaxine XR (EFFEXOR-XR) 150 MG 24 hr capsule Take 150 mg by mouth daily.    Historical Provider, MD  venlafaxine XR (EFFEXOR-XR) 150 MG 24 hr capsule Take 1 capsule (150 mg total) by mouth daily. 03/02/15   Ramonita Lab, MD  venlafaxine XR (EFFEXOR-XR) 37.5 MG 24 hr capsule Take 1 capsule (37.5 mg total) by mouth daily with breakfast. 03/02/15   Ramonita Lab, MD  Vitamin D, Ergocalciferol, (DRISDOL) 50000 UNITS CAPS capsule Take 50,000 Units by mouth every 7 (seven) days. Pt takes on Sunday.    Historical Provider, MD  vitamin E 200 UNIT  capsule Take 200 Units by mouth daily.    Historical Provider, MD  zinc gluconate 50 MG tablet Take 50 mg by mouth daily.    Historical Provider, MD    Allergies  Allergen Reactions  . Fluvirin [Flu Virus Vaccine] Diarrhea  . Penicillins Hives and Other (See Comments)    Has patient had a PCN reaction causing immediate rash, facial/tongue/throat swelling, SOB or lightheadedness with hypotension: No Has patient had a PCN reaction causing severe rash involving mucus membranes or skin necrosis: No Has patient had a PCN reaction that required hospitalization No Has patient had a PCN reaction occurring within the last 10 years: No If all of the above answers are "NO",  then may proceed with Cephalosporin use.  . Tetanus Toxoids Diarrhea    Family History  Problem Relation Age of Onset  . Stroke Mother   . CAD Mother   . CAD Father   . Alcohol abuse Brother     Social History Social History  Substance Use Topics  . Smoking status: Light Tobacco Smoker    Years: 50.00    Types: Cigarettes  . Smokeless tobacco: Never Used     Comment: 0-2 cigs daily  . Alcohol use Yes     Comment: occasionally - 1 drink/6 months    Review of Systems Constitutional: Negative for fever. Cardiovascular: Negative for chest pain. Respiratory: Negative for shortness of breath. Gastrointestinal: Negative for abdominal pain Neurological: Negative for headache. Denies weakness or numbness of any arm or leg. Difficulty with speech and positive for confusion. 10-point ROS otherwise negative.  ____________________________________________   PHYSICAL EXAM:  VITAL SIGNS: ED Triage Vitals  Enc Vitals Group     BP 08/05/16 2200 (!) 200/108     Pulse Rate 08/05/16 2200 (!) 102     Resp 08/05/16 2200 18     Temp 08/05/16 2200 98.6 F (37 C)     Temp Source 08/05/16 2200 Oral     SpO2 08/05/16 2159 100 %     Weight 08/05/16 2201 135 lb (61.2 kg)     Height 08/05/16 2201  (1.6 m)     Head  Circumference --      Peak Flow --      Pain Score 08/05/16 2159 8     Pain Loc --      Pain Edu? --      Excl. in GC? --     Constitutional: Alert and oriented. Well appearing and in no distress. Eyes: Normal exam ENT   Head: Normocephalic and atraumatic   Mouth/Throat: Mucous membranes are moist. Cardiovascular: Normal rate, regular rhythm.  Respiratory: Normal respiratory effort without tachypnea nor retractions. Breath sounds are clear  Gastrointestinal: Soft and nontender. No distention.  Musculoskeletal: Nontender with normal range of motion in all extremities. Neurologic:  Patient with moderate dysarthria, mild confusion. 5/5 motor in extremities, equal grip strengths bilaterally without pronator drift. Sensation intact and equal. No cranial nerve deficits identified. Skin:  Skin is warm, dry and intact.  Psychiatric: Mood and affect are normal. Speech and behavior are normal.   ____________________________________________    EKG  EKG reviewed and interpreted by myself shows sinus tachycardia at 102 bpm, narrow QRS, normal axis, normal intervals, no concerning ST changes identified.  ____________________________________________    RADIOLOGY  IMPRESSION: 1. 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 By reported history, the patient has had a previous ischemic event involving visual deficits. In this context, this finding is expected to be subacute to chronic; however, MRI would provide more definitive characterization. 2. No intracranial hemorrhage or mass effect. 3. ASPECTS is 10.  ____________________________________________   INITIAL IMPRESSION / ASSESSMENT AND PLAN / ED COURSE  Pertinent labs & imaging results that were available during my care of the patient were reviewed by me and considered in my medical decision making (see chart for details).  Patient presents to the emergency  department with acute onset of confusion and difficulty speaking beginning at 4:30 PM per husband. I have initiated code stroke protocols. CT scan shows age-indeterminate infarct of the right occipital lobe new from 2016. No bleed. We will dose aspirin. Patient  is currently being seen by the specialist on-call neurologist. Patient's lab work is largely nonrevealing, chemistry and troponin pending.  ----------------------------------------- 10:53 PM on 08/05/2016 -----------------------------------------  Patient's labs are largely within normal limits. CT scan shows likely occipital infarct of undetermined age. I discussed the patient with the neurologist who states the patient is outside of the TPA window but recommends obtaining a stat CT angiography of the head and neck to rule out large vessel occlusion which would be an indication to transfer the patient to facility that could perform an intervention. Otherwise the patient will be admitted to [facility] regional for further treatment of likely acute CVA.   NIH Stroke Scale   Interval: Baseline Time: 10:34 PM Person Administering Scale: Randel Hargens  Administer stroke scale items in the order listed. Record performance in each category after each subscale exam. Do not go back and change scores. Follow directions provided for each exam technique. Scores should reflect what the patient does, not what the clinician thinks the patient can do. The clinician should record answers while administering the exam and work quickly. Except where indicated, the patient should not be coached (i.e., repeated requests to patient to make a special effort).   1a  Level of consciousness: 0=alert; keenly responsive  1b. LOC questions:  0=Performs both tasks correctly  1c. LOC commands: 0=Performs both tasks correctly  2.  Best Gaze: 0=normal  3.  Visual: 0=No visual loss  4. Facial Palsy: 0=Normal symmetric movement  5a.  Motor left arm: 0=No drift, limb  holds 90 (or 45) degrees for full 10 seconds  5b.  Motor right arm: 0=No drift, limb holds 90 (or 45) degrees for full 10 seconds  6a. motor left leg: 0=No drift, limb holds 90 (or 45) degrees for full 10 seconds  6b  Motor right leg:  0=No drift, limb holds 90 (or 45) degrees for full 10 seconds  7. Limb Ataxia: 0=Absent  8.  Sensory: 0=Normal; no sensory loss  9. Best Language:  1=Mild to moderate aphasia; some obvious loss of fluency or facility of comprehension without significant limitation on ideas expressed or form of expression.  10. Dysarthria: 1=Mild to moderate, patient slurs at least some words and at worst, can be understood with some difficulty  11. Extinction and Inattention: 0=No abnormality  12. Distal motor function: 0=Normal   Total:   2    ----------------------------------------- 10:55 PM on 08/05/2016 -----------------------------------------  There has been an accidental medication error. Patient was accidentally given 100 mg of metoprolol tartrate instead of 324 mg of aspirin. Patient is currently hypertensive 200/108 and tachycardic at 102 bpm. We will continue to closlye monitor on a cardiac monitor. I do not anticipate any ill effects from the medication.    ----------------------------------------- 11:57 PM on 08/05/2016 -----------------------------------------  CT angiography is within normal limits. Given the patient's possible acute CVA on CT we'll admit to the hospital for further treatment. I also discussed with the patient and family the medication error that occurred during her emergency department stay. They're very understanding. Again I would expect no ill effect from the medication, but the patient and family are aware nonetheless.  ____________________________________________   FINAL CLINICAL IMPRESSION(S) / ED DIAGNOSES  CVA Expressive aphasia   Minna Antis, MD 08/05/16 2359

## 2016-08-06 ENCOUNTER — Observation Stay: Payer: Medicare Other

## 2016-08-06 ENCOUNTER — Observation Stay
Admit: 2016-08-06 | Discharge: 2016-08-06 | Disposition: A | Discharge status: Home / Self Care | Payer: Medicare Other | Attending: Family Medicine | Admitting: Family Medicine

## 2016-08-06 ENCOUNTER — Observation Stay: Admit: 2016-08-06 | Payer: Medicare Other

## 2016-08-06 DIAGNOSIS — I639 Cerebral infarction, unspecified: Secondary | ICD-10-CM | POA: Diagnosis not present

## 2016-08-06 DIAGNOSIS — R410 Disorientation, unspecified: Secondary | ICD-10-CM | POA: Diagnosis not present

## 2016-08-06 LAB — TROPONIN I
Troponin I: <0.03 ng/mL (ref <0.03)
Troponin I: <0.03 ng/mL (ref <0.03)

## 2016-08-06 LAB — LIPID PANEL
Cholesterol: 185 mg/dL (ref 0–200)
HDL: 75 mg/dL (ref >40)
LDL Cholesterol: 92 mg/dL (ref 0–99)
TRIGLYCERIDES: 89 mg/dL (ref <150)
Total CHOL/HDL Ratio: 2.5 RATIO
VLDL: 18 mg/dL (ref 0–40)

## 2016-08-06 MED ORDER — ASPIRIN EC 81 MG PO TBEC
81.0000 mg | DELAYED_RELEASE_TABLET | Freq: Every day | ORAL | Status: DC
  Administered 2016-08-06 – 2016-08-10 (×5): 81 mg via ORAL
  Filled 2016-08-06 (×5): qty 1

## 2016-08-06 MED ORDER — ENOXAPARIN SODIUM 40 MG/0.4ML ~~LOC~~ SOLN
40.0000 mg | SUBCUTANEOUS | Status: DC
  Administered 2016-08-06 – 2016-08-09 (×3): 40 mg via SUBCUTANEOUS
  Filled 2016-08-06 (×4): qty 0.4

## 2016-08-06 MED ORDER — DEXTROSE 5 % IV SOLN
2.0000 g | Freq: Two times a day (BID) | INTRAVENOUS | Status: DC
  Administered 2016-08-06 – 2016-08-10 (×9): 2 g via INTRAVENOUS
  Filled 2016-08-06 (×10): qty 2

## 2016-08-06 MED ORDER — MOMETASONE FURO-FORMOTEROL FUM 100-5 MCG/ACT IN AERO
2.0000 | INHALATION_SPRAY | Freq: Two times a day (BID) | RESPIRATORY_TRACT | Status: DC
  Administered 2016-08-06 – 2016-08-10 (×9): 2 via RESPIRATORY_TRACT
  Filled 2016-08-06: qty 8.8

## 2016-08-06 MED ORDER — CLOPIDOGREL BISULFATE 75 MG PO TABS
75.0000 mg | ORAL_TABLET | Freq: Every day | ORAL | Status: DC
  Administered 2016-08-06 – 2016-08-10 (×5): 75 mg via ORAL
  Filled 2016-08-06 (×5): qty 1

## 2016-08-06 MED ORDER — LEVALBUTEROL TARTRATE 45 MCG/ACT IN AERO: 2.0000 | INHALATION_SPRAY | Freq: Four times a day (QID) | RESPIRATORY_TRACT | Status: DC | PRN

## 2016-08-06 MED ORDER — ACETAMINOPHEN 160 MG/5ML PO SOLN
650.0000 mg | ORAL | Status: DC | PRN
  Filled 2016-08-06: qty 20.3

## 2016-08-06 MED ORDER — FLUTICASONE FUROATE-VILANTEROL 200-25 MCG/INH IN AEPB
1.0000 | INHALATION_SPRAY | Freq: Every day | RESPIRATORY_TRACT | Status: DC
Start: 2016-08-06 — End: 2016-08-07
  Administered 2016-08-06 – 2016-08-07 (×2): 1 via RESPIRATORY_TRACT
  Filled 2016-08-06: qty 28

## 2016-08-06 MED ORDER — STROKE: EARLY STAGES OF RECOVERY BOOK
Freq: Once | Status: AC
  Administered 2016-08-06: 03:00:00

## 2016-08-06 MED ORDER — VANCOMYCIN HCL IN DEXTROSE 750-5 MG/150ML-% IV SOLN
750.0000 mg | Freq: Two times a day (BID) | INTRAVENOUS | Status: DC
  Administered 2016-08-07 – 2016-08-09 (×6): 750 mg via INTRAVENOUS
  Filled 2016-08-06 (×9): qty 150

## 2016-08-06 MED ORDER — DEXTROSE 5 % IV SOLN
10.0000 mg/kg | Freq: Three times a day (TID) | INTRAVENOUS | Status: DC
  Administered 2016-08-07 – 2016-08-10 (×12): 525 mg via INTRAVENOUS
  Filled 2016-08-06 (×16): qty 10.5

## 2016-08-06 MED ORDER — ROFLUMILAST 500 MCG PO TABS
500.0000 ug | ORAL_TABLET | Freq: Every day | ORAL | Status: DC
  Administered 2016-08-06 – 2016-08-10 (×5): 500 ug via ORAL
  Filled 2016-08-06 (×5): qty 1

## 2016-08-06 MED ORDER — ALPRAZOLAM 0.5 MG PO TABS
0.5000 mg | ORAL_TABLET | Freq: Two times a day (BID) | ORAL | Status: DC
Start: 2016-08-06 — End: 2016-08-10
  Administered 2016-08-06 – 2016-08-10 (×8): 0.5 mg via ORAL
  Filled 2016-08-06 (×8): qty 1

## 2016-08-06 MED ORDER — ACETAMINOPHEN 325 MG PO TABS
650.0000 mg | ORAL_TABLET | ORAL | Status: DC | PRN
  Administered 2016-08-06 – 2016-08-08 (×2): 650 mg via ORAL
  Filled 2016-08-06 (×2): qty 2

## 2016-08-06 MED ORDER — ACETAMINOPHEN 650 MG RE SUPP: 650.0000 mg | RECTAL | Status: DC | PRN

## 2016-08-06 MED ORDER — SENNOSIDES-DOCUSATE SODIUM 8.6-50 MG PO TABS: 1.0000 | ORAL_TABLET | Freq: Every evening | ORAL | Status: DC | PRN

## 2016-08-06 MED ORDER — ALBUTEROL SULFATE (2.5 MG/3ML) 0.083% IN NEBU: 2.5000 mg | INHALATION_SOLUTION | Freq: Four times a day (QID) | RESPIRATORY_TRACT | Status: DC | PRN

## 2016-08-06 MED ORDER — ONDANSETRON HCL 4 MG/2ML IJ SOLN
4.0000 mg | Freq: Once | INTRAMUSCULAR | Status: AC
  Administered 2016-08-06: 4 mg via INTRAVENOUS
  Filled 2016-08-06: qty 2

## 2016-08-06 MED ORDER — OLANZAPINE 5 MG PO TBDP
5.0000 mg | ORAL_TABLET | Freq: Every day | ORAL | Status: DC
  Administered 2016-08-07 – 2016-08-09 (×4): 5 mg via ORAL
  Filled 2016-08-06 (×4): qty 1

## 2016-08-06 MED ORDER — SODIUM CHLORIDE 0.9 % IV SOLN
INTRAVENOUS | Status: DC
  Administered 2016-08-06 – 2016-08-07 (×3): via INTRAVENOUS

## 2016-08-06 MED ORDER — ACYCLOVIR SODIUM 50 MG/ML IV SOLN
5.0000 mg/kg | Freq: Three times a day (TID) | INTRAVENOUS | Status: DC
  Filled 2016-08-06 (×2): qty 5.2

## 2016-08-06 MED ORDER — VANCOMYCIN HCL 10 G IV SOLR
1250.0000 mg | INTRAVENOUS | Status: AC
  Administered 2016-08-06: 1250 mg via INTRAVENOUS
  Filled 2016-08-06: qty 1250

## 2016-08-06 NOTE — ED Notes (Signed)
Pt woke up from sleep, confused and disoriented.  Pt having intermittent disorientation throughout visit, but very anxious at this time.  This RN and Medic student able to verbally de-escalate pt, and pt comfortable at this time.

## 2016-08-06 NOTE — Progress Notes (Signed)
OT Cancellation Note  Patient Details Name: Shannon Wilkerson MRN: [id number] DOB: Apr 16, 1946   Cancelled Treatment:    Reason Eval/Treat Not Completed: Other (comment). Order received, chart reviewed. Spoke with RN re: bed rest order. Once cleared for activity as tolerated, attempted to see pt for OT evaluation. Pt anxious, perseverating on need for food/drink, difficult to redirect focus/attend to OT questions. Confirmed NPO w/ RN, spoke with SLP for evaluation, SLP reports on her way in next 20-30 minutes. Notified pt and RN. Will continue to monitor and re-attempt OT evaluation at later time as appropriate.  Richrd Prime, MPH, MS, OTR/L ascom [phone] 08/06/16, 12:08 PM

## 2016-08-06 NOTE — Consult Note (Signed)
[facility] Consult   Reason for Consult:  Consult for 71 year old Wilkerson with a history of depression and strokes currently in the hospital with altered mental status Referring Physician:  Vianne Bulls Patient Identification: Shannon Wilkerson MRN:  [id number] Principal Diagnosis: Acute delirium Diagnosis:   Patient Active Problem List   Diagnosis Date Noted  . CVA (cerebral vascular accident) ([place]) [I63.9] 08/06/2016  . Acute delirium [R41.0] 08/06/2016  . Major depression [F32.9] 03/01/2015  . Generalized anxiety disorder [F41.1] 03/01/2015  . CVA (cerebral infarction) [I63.9] 10/27/2014  . COPD (chronic obstructive pulmonary disease) ([place]) [J44.9] 10/27/2014  . Chronic respiratory failure ([place]) [J96.10] 10/27/2014  . Anxiety [F41.9] 10/27/2014  . Hypertension [I10] 10/27/2014    Total Time spent with patient: 1 hour  Subjective:   Shannon Wilkerson is a 71 y.o. female patient admitted with "it is, you know, that thing".  HPI:  Patient interviewed chart reviewed. Shannon Wilkerson with a history of past strokes and a history of depression brought into the hospital with acute mental status changes. Patient was awake and interactive during the interview but has a great deal of difficulty communicating. She was not able to give any clear lucid account of her history. Parent lately was brought in yesterday with the family reporting an acute onset of change in her communication. Patient is being worked up for stroke. Has been seen by neurology already. Some of the workup is completed. Patient's speech varies in quality during the interview. She is able to tell me her name and is able to answer some other questions appropriately. She is able to follow simple one-step commands without difficulty. At other times however she will appear to not understand questions and will begin to repeat herself and use language that doesn't make sense. Patient was not able to tell me what medication she had  been taking recently. I was able to see from the controlled substance database that she is still prescribed Xanax 0.5 mg twice a day. Patient talks several times about her husband being angry with her. Seems to have some degree of emotional distress but difficulty communicating it. She also on several occasions said "I might as well die". When saying this she did not seem to be particularly more distraught or emotional than other times and was not able to elaborate on it.  Social history: Patient lives with her husband. No children. Does not work outside the home. Sounds like there is extended family involved socially.  Medical history: She has a documented past history of strokes. History of elevated blood pressure. COPD.  Substance abuse history: Doing no history of substance abuse. Chronic use of modest doses of benzodiazepines without any documented abuse.  Past Psychiatric History: I have seen this patient once before in late 2016. Consult at that time was also for depression. She was more lucid and did not have this kind of mental state status. She did however have some odd symptoms such as a difficulty getting songs out of her head. She was being treated with low-dose olanzapine, low dose Xanax and Effexor at that time for a diagnosis of depression. No history of suicide attempts. No history of hospitalization. No known history of bipolar disorder.  Risk to Self: Is patient at risk for suicide?: No Risk to Others:   Prior Inpatient Therapy:   Prior Outpatient Therapy:    Past Medical History:  Past Medical History:  Diagnosis Date  . Anemia    past hx  . Anxiety   .  Asthma   . COPD (chronic obstructive pulmonary disease) ([place])   . Hypertension   . Neck stiffness    limited movement s/p fusion c4-c5, c5-c6  . Shortness of breath dyspnea    exertion  . TIA (transient ischemic attack)    vision issues    Past Surgical History:  Procedure Laterality Date  . CARDIAC CATHETERIZATION      "many yrs ago"  . CATARACT EXTRACTION W/PHACO Left 01/17/2015   Procedure: CATARACT EXTRACTION PHACO AND INTRAOCULAR LENS PLACEMENT (IOC);  Surgeon: Ronnell Freshwater, MD;  Location: [facility];  Service: Ophthalmology;  Laterality: Left;  . [place]   C4-5-6, C4-5  . DILATION AND CURETTAGE OF UTERUS  1999  . TONSILLECTOMY  1956   Family History:  Family History  Problem Relation Age of Onset  . Stroke Mother   . CAD Mother   . CAD Father   . Alcohol abuse Brother    Family Psychiatric  History: Unknown Social History:  History  Alcohol Use  . Yes    Comment: occasionally - 1 drink/6 months     History  Drug Use No    Social History   Social History  . Marital status: Married    Spouse name: N/A  . Number of children: N/A  . Years of education: N/A   Social History Main Topics  . Smoking status: Light Tobacco Smoker    Years: 50.00    Types: Cigarettes  . Smokeless tobacco: Never Used     Comment: 0-2 cigs daily  . Alcohol use Yes     Comment: occasionally - 1 drink/6 months  . Drug use: No  . Sexual activity: Not Asked   Other Topics Concern  . None   Social History Narrative  . None   Additional Social History:    Allergies:   Allergies  Allergen Reactions  . Fluvirin [Flu Virus Vaccine] Diarrhea  . Penicillins Hives and Other (See Comments)    Has patient had a PCN reaction causing immediate rash, facial/tongue/throat swelling, SOB or lightheadedness with hypotension: No Has patient had a PCN reaction causing severe rash involving mucus membranes or skin necrosis: No Has patient had a PCN reaction that required hospitalization No Has patient had a PCN reaction occurring within the last 10 years: No If all of the above answers are "NO", then may proceed with Cephalosporin use.  . Tetanus Toxoids Diarrhea    Labs:  Results for orders placed or performed during the hospital encounter of 08/05/16 (from the past  48 hour(s))  Protime-INR     Status: None   Collection Time: 08/05/16 10:03 PM  Result Value Ref Range   Prothrombin Time 11.7 11.4 - 15.2 seconds   INR 0.86   APTT     Status: None   Collection Time: 08/05/16 10:03 PM  Result Value Ref Range   aPTT 30 24 - 36 seconds  CBC     Status: Abnormal   Collection Time: 08/05/16 10:03 PM  Result Value Ref Range   WBC 6.0 3.6 - 11.0 K/uL   RBC 5.01 3.80 - 5.20 MIL/uL   Hemoglobin 13.1 12.0 - 16.0 g/dL   HCT 40.9 35.0 - 47.0 %   MCV 81.6 80.0 - 100.0 fL   MCH 26.1 26.0 - 34.0 pg   MCHC 32.0 32.0 - 36.0 g/dL   RDW 15.9 (H) 11.5 - 14.5 %   Platelets 76 (L) 150 - 440 K/uL  Differential  Status: Abnormal   Collection Time: 08/05/16 10:03 PM  Result Value Ref Range   Neutrophils Relative % 84 %   Neutro Abs 5.1 1.4 - 6.5 K/uL   Lymphocytes Relative 11 %   Lymphs Abs 0.6 (L) 1.0 - 3.6 K/uL   Monocytes Relative 5 %   Monocytes Absolute 0.3 0.2 - 0.9 K/uL   Eosinophils Relative 0 %   Eosinophils Absolute 0.0 0 - 0.7 K/uL   Basophils Relative 0 %   Basophils Absolute 0.0 0 - 0.1 K/uL  Comprehensive metabolic panel     Status: Abnormal   Collection Time: 08/05/16 10:03 PM  Result Value Ref Range   Sodium 137 135 - 145 mmol/L   Potassium 4.1 3.5 - 5.1 mmol/L   Chloride 92 (L) 101 - 111 mmol/L   CO2 37 (H) 22 - 32 mmol/L   Glucose, Bld 132 (H) 65 - 99 mg/dL   BUN 10 6 - 20 mg/dL   Creatinine, Ser 0.38 (L) 0.44 - 1.00 mg/dL   Calcium 9.3 8.9 - 10.3 mg/dL   Total Protein 7.7 6.5 - 8.1 g/dL   Albumin 4.5 3.5 - 5.0 g/dL   AST 20 15 - 41 U/L   ALT 14 14 - 54 U/L   Alkaline Phosphatase 64 38 - 126 U/L   Total Bilirubin 0.7 0.3 - 1.2 mg/dL   GFR calc non Af Amer >60 >60 mL/min   GFR calc Af Amer >60 >60 mL/min    Comment: (NOTE) The eGFR has been calculated using the CKD EPI equation. This calculation has not been validated in all clinical situations. eGFR's persistently <60 mL/min signify possible Chronic Kidney Disease.    Anion  gap 8 5 - 15  Troponin I     Status: None   Collection Time: 08/05/16 10:03 PM  Result Value Ref Range   Troponin I <0.03 <0.03 ng/mL  Ethanol     Status: None   Collection Time: 08/05/16 10:03 PM  Result Value Ref Range   Alcohol, Ethyl (B) <5 <5 mg/dL    Comment:        LOWEST DETECTABLE LIMIT FOR SERUM ALCOHOL IS 5 mg/dL FOR MEDICAL PURPOSES ONLY   Urinalysis, Routine w reflex microscopic     Status: Abnormal   Collection Time: 08/05/16 10:03 PM  Result Value Ref Range   Color, Urine STRAW (A) YELLOW   APPearance CLEAR (A) CLEAR   Specific Gravity, Urine 1.011 1.005 - 1.030   pH 8.0 5.0 - 8.0   Glucose, UA NEGATIVE NEGATIVE mg/dL   Hgb urine dipstick SMALL (A) NEGATIVE   Bilirubin Urine NEGATIVE NEGATIVE   Ketones, ur 20 (A) NEGATIVE mg/dL   Protein, ur 30 (A) NEGATIVE mg/dL   Nitrite NEGATIVE NEGATIVE   Leukocytes, UA NEGATIVE NEGATIVE   RBC / HPF 0-5 0 - 5 RBC/hpf   WBC, UA 0-5 0 - 5 WBC/hpf   Bacteria, UA NONE SEEN NONE SEEN   Squamous Epithelial / LPF NONE SEEN NONE SEEN  Urine Drug Screen, Qualitative ([facility] only)     Status: Abnormal   Collection Time: 08/05/16 10:03 PM  Result Value Ref Range   Tricyclic, Ur Screen NONE DETECTED NONE DETECTED   Amphetamines, Ur Screen NONE DETECTED NONE DETECTED   MDMA (Ecstasy)Ur Screen NONE DETECTED NONE DETECTED   Cocaine Metabolite,Ur [place] NONE DETECTED NONE DETECTED   Opiate, Ur Screen NONE DETECTED NONE DETECTED   Phencyclidine (PCP) Ur S NONE DETECTED NONE DETECTED   Cannabinoid 50  Ng, Ur [place] NONE DETECTED NONE DETECTED   Barbiturates, Ur Screen NONE DETECTED NONE DETECTED   Benzodiazepine, Ur Scrn POSITIVE (A) NONE DETECTED   Methadone Scn, Ur NONE DETECTED NONE DETECTED    Comment: (NOTE) 194  Tricyclics, urine               Cutoff 1000 ng/mL 200  Amphetamines, urine             Cutoff 1000 ng/mL 300  MDMA (Ecstasy), urine           Cutoff 500 ng/mL 400  Cocaine Metabolite, urine       Cutoff 300 ng/mL 500  Opiate,  urine                   Cutoff 300 ng/mL 600  Phencyclidine (PCP), urine      Cutoff 25 ng/mL 700  Cannabinoid, urine              Cutoff 50 ng/mL 800  Barbiturates, urine             Cutoff 200 ng/mL 900  Benzodiazepine, urine           Cutoff 200 ng/mL 1000 Methadone, urine                Cutoff 300 ng/mL 1100 1200 The urine drug screen provides only a preliminary, unconfirmed 1300 analytical test result and should not be used for non-medical 1400 purposes. Clinical consideration and professional judgment should 1500 be applied to any positive drug screen result due to possible 1600 interfering substances. A more specific alternate chemical method 1700 must be used in order to obtain a confirmed analytical result.  1800 Gas chromato graphy / mass spectrometry (GC/MS) is the preferred 1900 confirmatory method.   Blood gas, arterial     Status: Abnormal (Preliminary result)   Collection Time: 08/06/16  1:51 AM  Result Value Ref Range   pH, Arterial 7.41 7.350 - 7.450   pCO2 arterial 62 (H) 32.0 - 48.0 mmHg   pO2, Arterial 109 (H) 83.0 - 108.0 mmHg   Bicarbonate 39.3 (H) 20.0 - 28.0 mmol/L   Acid-Base Excess 12.1 (H) 0.0 - 2.0 mmol/L   O2 Saturation 98.3 %   Patient temperature 37.0    Oxygen index PENDING    Collection site RIGHT RADIAL    Sample type ARTERIAL DRAW    Allens test (pass/fail) PASS PASS  Troponin I     Status: None   Collection Time: 08/06/16  3:03 AM  Result Value Ref Range   Troponin I <0.03 <0.03 ng/mL  Troponin I     Status: None   Collection Time: 08/06/16  9:40 AM  Result Value Ref Range   Troponin I <0.03 <0.03 ng/mL  Lipid panel     Status: None   Collection Time: 08/06/16  9:40 AM  Result Value Ref Range   Cholesterol 185 0 - 200 mg/dL   Triglycerides 89 <150 mg/dL   HDL 75 >40 mg/dL   Total CHOL/HDL Ratio 2.5 RATIO   VLDL 18 0 - 40 mg/dL   LDL Cholesterol 92 0 - 99 mg/dL    Comment:        Total Cholesterol/HDL:CHD Risk Coronary Heart Disease  Risk Table                     Men   Women  1/2 Average Risk   3.4   3.3  Average Risk  5.0   4.4  2 X Average Risk   9.6   7.1  3 X Average Risk  23.4   11.0        Use the calculated Patient Ratio above and the CHD Risk Table to determine the patient's CHD Risk.        ATP III CLASSIFICATION (LDL):  <100     mg/dL   Optimal  100-129  mg/dL   Near or Above                    Optimal  130-159  mg/dL   Borderline  160-189  mg/dL   High  >190     mg/dL   Very High   Troponin I     Status: None   Collection Time: 08/06/16  2:38 PM  Result Value Ref Range   Troponin I <0.03 <0.03 ng/mL    Current Facility-Administered Medications  Medication Dose Route Frequency Provider Last Rate Last Dose  . 0.9 %  sodium chloride infusion   Intravenous Continuous Alexis Hugelmeyer, DO 75 mL/hr at 08/06/16 1527    . acetaminophen (TYLENOL) tablet 650 mg  650 mg Oral Q4H PRN Alexis Hugelmeyer, DO       Or  . acetaminophen (TYLENOL) solution 650 mg  650 mg Per Tube Q4H PRN Alexis Hugelmeyer, DO       Or  . acetaminophen (TYLENOL) suppository 650 mg  650 mg Rectal Q4H PRN Alexis Hugelmeyer, DO      . acyclovir (ZOVIRAX) 525 mg in dextrose 5 % 100 mL IVPB  10 mg/kg (Ideal) Intravenous Q8H Epifanio Lesches, MD      . albuterol (PROVENTIL) (2.5 MG/3ML) 0.083% nebulizer solution 2.5 mg  2.5 mg Nebulization Q6H PRN Alexis Hugelmeyer, DO      . ALPRAZolam Duanne Moron) tablet 0.5 mg  0.5 mg Oral BID Gonzella Lex, MD      . aspirin EC tablet 81 mg  81 mg Oral Daily Alexis Hugelmeyer, DO   81 mg at 08/06/16 1557  . cefTRIAXone (ROCEPHIN) 2 g in dextrose 5 % 50 mL IVPB  2 g Intravenous Q12H Epifanio Lesches, MD   2 g at 08/06/16 1806  . clopidogrel (PLAVIX) tablet 75 mg  75 mg Oral Daily Alexis Hugelmeyer, DO   75 mg at 08/06/16 1557  . enoxaparin (LOVENOX) injection 40 mg  40 mg Subcutaneous Q24H Alexis Hugelmeyer, DO   40 mg at 08/06/16 0400  . fluticasone furoate-vilanterol (BREO ELLIPTA) 200-25  MCG/INH 1 puff  1 puff Inhalation Daily Alexis Hugelmeyer, DO   1 puff at 08/06/16 1557  . mometasone-formoterol (DULERA) 100-5 MCG/ACT inhaler 2 puff  2 puff Inhalation BID Alexis Hugelmeyer, DO   2 puff at 08/06/16 1558  . OLANZapine zydis (ZYPREXA) disintegrating tablet 5 mg  5 mg Oral QHS Gonzella Lex, MD      . roflumilast (DALIRESP) tablet 500 mcg  500 mcg Oral Daily Alexis Hugelmeyer, DO   500 mcg at 08/06/16 1557  . senna-docusate (Senokot-S) tablet 1 tablet  1 tablet Oral QHS PRN Alexis Hugelmeyer, DO      . [START ON 08/07/2016] vancomycin (VANCOCIN) IVPB 750 mg/150 ml premix  750 mg Intravenous Q12H Epifanio Lesches, MD        Musculoskeletal: Strength & Muscle Tone: within normal limits and Patient appeared to be symmetric in her muscle tone and activity. Used both of her hands and legs symmetrically with equal strength. Gait & Station: She did  not get up out of bed but appeared as I mentioned to have equal strength. Patient leans: N/A  Psychiatric Specialty Exam: Physical Exam  Nursing note and vitals reviewed. Constitutional: She appears well-developed and well-nourished.  HENT:  Head: Normocephalic and atraumatic.  Eyes: Conjunctivae are normal. Pupils are equal, round, and reactive to light.  Neck: Normal range of motion.  Cardiovascular: Regular rhythm and normal heart sounds.   Respiratory: She is in respiratory distress.  GI: Soft.  Musculoskeletal: Normal range of motion.  Neurological: She is alert.  Skin: Skin is warm and dry.  Psychiatric: Her affect is blunt. Her speech is delayed. She is slowed. Cognition and memory are impaired. She expresses inappropriate judgment. She expresses suicidal ideation. She expresses no suicidal plans. She is noncommunicative. She exhibits abnormal recent memory and abnormal remote memory.    Review of Systems  Unable to perform ROS: Mental status change    Blood pressure 131/71, pulse 93, temperature (!) 100.6 F (38.1 C),  temperature source Oral, resp. rate 18, height _0  (1.575 m), weight 62.5 kg (137 lb 11.2 oz), SpO2 96 %.Body mass index is 25.19 kg/m.  General Appearance: Casual  Eye Contact:  Fair  Speech:  Blocked and As noted above she has odd speech. Hard to tell if this is a true a facial or not. She was able to name some simple objects but not others. Able to answer some simple questions but not others. Able to produce some coherent sentences but at other times would appear to be blocked. At times I thought there was an emotional pattern to it but I could not be certain.  Volume:  Decreased  Mood:  Anxious  Affect:  Constricted  Thought Process:  Disorganized  Orientation:  Other:  Patient appeared to interact as though she understood the circumstances appropriately but could not tell me the name of the place where she was. She was able to tell me her name easily as well as the name of her husband.  Thought Content:  See previous notes  Suicidal Thoughts:  Yes.  without intent/plan  Homicidal Thoughts:  No  Memory:  Negative  Judgement:  Fair  Insight:  Shallow  Psychomotor Activity:  Restlessness  Concentration:  Concentration: Poor  Recall:  Poor  Fund of Knowledge:  Fair  Language:  Poor  Akathisia:  No  Handed:  Right  AIMS (if indicated):     Assets:  Desire for Improvement Financial Resources/Insurance Housing Social Support  ADL's:  Impaired  Cognition:  Impaired,  Mild  Sleep:        Treatment Plan Summary: Daily contact with patient to assess and evaluate symptoms and progress in treatment, Medication management and Plan 71 year old Wilkerson with mental status changes and confusion. I am giving her a diagnosis of acute delirium although I'm not sure if that is quite the right description. I will be interested to see whether it turns out that there is evidence of a stroke on the workup. Meanwhile I am going to make sure she continues to get her alprazolam a half milligram twice a  day and also restart the olanzapine that had been helpful in the past at 5 mg at night oral disintegrating tablets. Hold off on restarting any antidepressants until we have further confirmation of what she was taking previously. Explained all this to the patient although I have no way of knowing if she really understood the whole plan. I will follow-up while she is in the hospital.  Disposition: Supportive therapy provided about ongoing stressors.  Alethia Berthold, MD 08/06/2016 7:56 PM

## 2016-08-06 NOTE — Progress Notes (Signed)
Pharmacy Antibiotic Note  Shannon Wilkerson is a 71 y.o. female admitted on 08/05/2016 with meningitis/fever.  Pharmacy has been consulted for Vancomycin dosing.  Plan: Will give Vancomycin 1250 mg IV x 1. Will start Vancomycin 750 mg IV q12 hours starting @ 02:00 on 4/17. Will order Vancomycin trough level @ 1330 on 4/19.   Targeting a trough level of 15-20 mcg/dl.   Height:  (160 cm) Weight: 135 lb (61.2 kg) IBW/kg (Calculated) : 52.4  Temp (24hrs), Avg:99 F (37.2 C), Min:97.6 F (36.4 C), Max:100.9 F (38.3 C)   Recent Labs Lab 08/05/16 2203  WBC 6.0  CREATININE 0.38*    Estimated Creatinine Clearance: 54.9 mL/min (A) (by C-G formula based on SCr of 0.38 mg/dL (L)).    Allergies  Allergen Reactions  . Fluvirin [Flu Virus Vaccine] Diarrhea  . Penicillins Hives and Other (See Comments)    Has patient had a PCN reaction causing immediate rash, facial/tongue/throat swelling, SOB or lightheadedness with hypotension: No Has patient had a PCN reaction causing severe rash involving mucus membranes or skin necrosis: No Has patient had a PCN reaction that required hospitalization No Has patient had a PCN reaction occurring within the last 10 years: No If all of the above answers are "NO", then may proceed with Cephalosporin use.  . Tetanus Toxoids Diarrhea    Antimicrobials this admission: Vancomcyin  4/16 >>  Ceftriaxone  4/16  >>  Acyclovir 4/16 >>>  Dose adjustments this admission:   Microbiology results:  BCx:   UCx:    Sputum:    MRSA PCR:   Thank you for allowing pharmacy to be a part of this patient's care.  Kameo Bains D 08/06/2016 3:19 PM

## 2016-08-06 NOTE — Progress Notes (Addendum)
[facility] [place] [facility]   PATIENT NAME: Shannon Wilkerson    MR#:  [id number]  DATE OF BIRTH:  1945-06-10  SUBJECTIVE:seen at bedside.very confused,according to husband her confusion is worse than today morning.admitted for expressive aphasia and possible confusion.  CHIEF COMPLAINT:   Chief Complaint  Patient presents with  . Hypertension  . Altered Mental Status    REVIEW OF SYSTEMS:    Review of Systems  Unable to perform ROS: Mental status change    Nutrition:  Tolerating Diet: Tolerating PT:      DRUG ALLERGIES:   Allergies  Allergen Reactions  . Fluvirin [Flu Virus Vaccine] Diarrhea  . Penicillins Hives and Other (See Comments)    Has patient had a PCN reaction causing immediate rash, facial/tongue/throat swelling, SOB or lightheadedness with hypotension: No Has patient had a PCN reaction causing severe rash involving mucus membranes or skin necrosis: No Has patient had a PCN reaction that required hospitalization No Has patient had a PCN reaction occurring within the last 10 years: No If all of the above answers are "NO", then may proceed with Cephalosporin use.  . Tetanus Toxoids Diarrhea    VITALS:  Blood pressure 131/71, pulse 93, temperature (!) 100.8 F (38.2 C), temperature source Oral, resp. rate 18, height  (1.575 m), weight 62.5 kg (137 lb 11.2 oz), SpO2 96 %.  PHYSICAL EXAMINATION:   Physical Exam  GENERAL:  71 y.o.-year-old patient lying in the  Bed,confused.  EYES: Pupils equal, round, reactive to light and accommodation. No scleral icterus. Extraocular muscles intact.  HEENT: Head atraumatic, normocephalic. Oropharynx and nasopharynx clear.  NECK:  Supple, no jugular venous distention. No thyroid enlargement, no tenderness.  LUNGS: Normal breath sounds bilaterally, no wheezing, rales,rhonchi or crepitation. No use of accessory muscles of respiration.  CARDIOVASCULAR: S1, S2 normal. No murmurs, rubs, or  gallops.  ABDOMEN: Soft, nontender, nondistended. Bowel sounds present. No organomegaly or mass.  EXTREMITIES: No pedal edema, cyanosis, or clubbing.  NEUROLOGIC: very confused,not able to follow direction.has expressive and receptive aphasia. PSYCHIATRIC: The patient is  Awake but confused.  SKIN: No obvious rash, lesion, or ulcer.    LABORATORY PANEL:   CBC  Recent Labs Lab 08/05/16 2203  WBC 6.0  HGB 13.1  HCT 40.9  PLT 76*   ------------------------------------------------------------------------------------------------------------------  Chemistries   Recent Labs Lab 08/05/16 2203  NA 137  K 4.1  CL 92*  CO2 37*  GLUCOSE 132*  BUN 10  CREATININE 0.38*  CALCIUM 9.3  AST 20  ALT 14  ALKPHOS 64  BILITOT 0.7   ------------------------------------------------------------------------------------------------------------------  Cardiac Enzymes  Recent Labs Lab 08/06/16 1438  TROPONINI <0.03   ------------------------------------------------------------------------------------------------------------------  RADIOLOGY:  Ct Angio Head W Or Wo Contrast  Result Date: 08/05/2016 CLINICAL DATA:  Sudden lungs a headache and altered mental status. Expressive aphasia. EXAM: CT ANGIOGRAPHY HEAD AND NECK TECHNIQUE: Multidetector CT imaging of the head and neck was performed using the standard protocol during bolus administration of intravenous contrast. Multiplanar CT image reconstructions and MIPs were obtained to evaluate the vascular anatomy. Carotid stenosis measurements (when applicable) are obtained utilizing NASCET criteria, using the distal internal carotid diameter as the denominator. CONTRAST:  75 mL Isovue 370 COMPARISON:  Head CT 08/05/2016 FINDINGS: CTA NECK FINDINGS Aortic arch: There is no aneurysm or dissection of the visualized ascending aorta or aortic arch. There is a normal 3 vessel branching pattern. There is atherosclerotic calcification within the  proximal subclavian arteries without stenosis.  There is calcific atherosclerosis of the aortic arch. Right carotid system: The right common carotid origin is widely patent. There is no common carotid or internal carotid artery dissection or aneurysm. Mild atherosclerosis of the carotid bifurcation with irregular linear plaque in the proximal internal carotid artery. No hemodynamically significant stenosis. Left carotid system: The left common carotid origin is widely patent. There is no common carotid or internal carotid artery dissection or aneurysm. Mild atherosclerosis of the carotid bifurcation without hemodynamically significant stenosis. Vertebral arteries: The vertebral system is codominant. Both vertebral artery origins are widely patent. Both vertebral arteries are normal to their confluence with the basilar artery. Skeleton: There is no bony spinal canal stenosis. No lytic or blastic lesions. Other neck: The nasopharynx is clear. The oropharynx and hypopharynx are normal. The epiglottis is normal. The supraglottic larynx, glottis and subglottic larynx are normal. No retropharyngeal collection. The parapharyngeal spaces are preserved. The parotid and submandibular glands are normal. No sialolithiasis or salivary ductal dilatation. The thyroid gland is normal. There is no cervical lymphadenopathy. Upper chest: Bilateral emphysema. No focal consolidation or nodule/mass. Review of the MIP images confirms the above findings CTA HEAD FINDINGS Anterior circulation: --Intracranial internal carotid arteries: Mild atherosclerotic calcification without stenosis. --Anterior cerebral arteries: Normal. --Middle cerebral arteries: Normal. --Posterior communicating arteries: Absent bilaterally. Posterior circulation: --Posterior cerebral arteries: Normal. --Superior cerebellar arteries: Normal. --Basilar artery: Normal. --Anterior inferior cerebellar arteries: Normal. --Posterior inferior cerebellar arteries: Normal.  Venous sinuses: As permitted by contrast timing, patent. Anatomic variants: None Delayed phase: Not performed. Review of the MIP images confirms the above findings IMPRESSION: 1. Normal intracranial CTA. 2. Bilateral mild internal carotid artery atherosclerosis without hemodynamically significant stenosis. 3. Aortic atherosclerosis. Electronically Signed   By: Deatra Robinson M.D.   On: 08/05/2016 23:40   Dg Chest 1 View  Result Date: 08/06/2016 CLINICAL DATA:  Speech difficulties. Confusion. History of asthma and COPD. EXAM: CHEST 1 VIEW COMPARISON:  03/31/2013. FINDINGS: Mediastinum and hilar structures are normal. Lungs are clear. No pleural effusion or pneumothorax. Heart size normal. Degenerative changes thoracic spine. IMPRESSION: No active disease. Electronically Signed   By: Maisie Fus  Register   On: 08/06/2016 14:53   Ct Head Wo Contrast  Result Date: 08/06/2016 CLINICAL DATA:  Altered mental status.  Confusion. EXAM: CT HEAD WITHOUT CONTRAST TECHNIQUE: Contiguous axial images were obtained from the base of the skull through the vertex without intravenous contrast. COMPARISON:  Head CT scan 08/05/2016. 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 Chronic microvascular ischemic change is noted. 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 No evidence of acute abnormality including hemorrhage, infarct, mass lesion, mass effect, midline shift or abnormal extra-axial fluid collection. No hydrocephalus or pneumocephalus. Vascular: Atherosclerosis noted. Skull: Intact. Sinuses/Orbits: Status post left lens extraction. Other: None. IMPRESSION: No acute abnormality. 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 Atherosclerosis. Electronically Signed   By: Drusilla Kanner M.D.   On: 08/06/2016 15:17   Ct Head Wo Contrast  Result Date: 08/05/2016 CLINICAL DATA:  Code stroke.  Confusion. EXAM: CT HEAD WITHOUT CONTRAST  TECHNIQUE: Contiguous axial images were obtained from the base of the skull through the vertex without intravenous contrast. COMPARISON:  Head CT 02/28/2015 FINDINGS: Brain: There is focal hypoattenuation within the medial right occipital lobe new from the prior study. No intracranial hemorrhage. No midline shift or other mass effect. No hydrocephalus. Brain parenchyma and CSF-containing spaces are otherwise normal for age. Vascular:  No hyperdense vessel or unexpected calcification. Skull: Normal visualized skull base, calvarium and extracranial soft tissues. Sinuses/Orbits: No sinus fluid levels or advanced mucosal thickening. No mastoid effusion. Normal orbits. ASPECTS [facility] Stroke Program Early CT Score) - Ganglionic level infarction (caudate, lentiform nuclei, internal capsule, insula, M1-M3 cortex): 7 - Supraganglionic infarction (M4-M6 cortex): 3 Total score (0-10 with 10 being normal): 10 IMPRESSION: 1. 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 By reported history, the patient has had a previous ischemic event involving visual deficits. In this context, this finding is expected to be subacute to chronic; however, MRI would provide more definitive characterization. 2. No intracranial hemorrhage or mass effect. 3. ASPECTS is 10. These results were called by telephone at the time of interpretation on 08/05/2016 at 10:28 pm to Dr. Minna Antis , who verbally acknowledged these results. Electronically Signed   By: Deatra Robinson M.D.   On: 08/05/2016 22:28   Ct Angio Neck W And/or Wo Contrast  Result Date: 08/05/2016 CLINICAL DATA:  Sudden lungs a headache and altered mental status. Expressive aphasia. EXAM: CT ANGIOGRAPHY HEAD AND NECK TECHNIQUE: Multidetector CT imaging of the head and neck was performed using the standard protocol during bolus administration of intravenous contrast. Multiplanar CT image reconstructions and MIPs were  obtained to evaluate the vascular anatomy. Carotid stenosis measurements (when applicable) are obtained utilizing NASCET criteria, using the distal internal carotid diameter as the denominator. CONTRAST:  75 mL Isovue 370 COMPARISON:  Head CT 08/05/2016 FINDINGS: CTA NECK FINDINGS Aortic arch: There is no aneurysm or dissection of the visualized ascending aorta or aortic arch. There is a normal 3 vessel branching pattern. There is atherosclerotic calcification within the proximal subclavian arteries without stenosis. There is calcific atherosclerosis of the aortic arch. Right carotid system: The right common carotid origin is widely patent. There is no common carotid or internal carotid artery dissection or aneurysm. Mild atherosclerosis of the carotid bifurcation with irregular linear plaque in the proximal internal carotid artery. No hemodynamically significant stenosis. Left carotid system: The left common carotid origin is widely patent. There is no common carotid or internal carotid artery dissection or aneurysm. Mild atherosclerosis of the carotid bifurcation without hemodynamically significant stenosis. Vertebral arteries: The vertebral system is codominant. Both vertebral artery origins are widely patent. Both vertebral arteries are normal to their confluence with the basilar artery. Skeleton: There is no bony spinal canal stenosis. No lytic or blastic lesions. Other neck: The nasopharynx is clear. The oropharynx and hypopharynx are normal. The epiglottis is normal. The supraglottic larynx, glottis and subglottic larynx are normal. No retropharyngeal collection. The parapharyngeal spaces are preserved. The parotid and submandibular glands are normal. No sialolithiasis or salivary ductal dilatation. The thyroid gland is normal. There is no cervical lymphadenopathy. Upper chest: Bilateral emphysema. No focal consolidation or nodule/mass. Review of the MIP images confirms the above findings CTA HEAD FINDINGS  Anterior circulation: --Intracranial internal carotid arteries: Mild atherosclerotic calcification without stenosis. --Anterior cerebral arteries: Normal. --Middle cerebral arteries: Normal. --Posterior communicating arteries: Absent bilaterally. Posterior circulation: --Posterior cerebral arteries: Normal. --Superior cerebellar arteries: Normal. --Basilar artery: Normal. --Anterior inferior cerebellar arteries: Normal. --Posterior inferior cerebellar arteries: Normal. Venous sinuses: As permitted by contrast timing, patent. Anatomic variants: None Delayed phase: Not performed. Review of the MIP images confirms the above findings IMPRESSION: 1. Normal intracranial CTA. 2. Bilateral mild internal carotid artery atherosclerosis without hemodynamically significant stenosis. 3. Aortic atherosclerosis. Electronically Signed   By: Caryn Bee  Chase Picket M.D.   On: 08/05/2016 23:40     ASSESSMENT AND PLAN:   Active Problems:   CVA (cerebral vascular accident) (HCC)  1.Altered mental status likley due to possible stroke,but  Could not lie down still so she could not have MRI>CT head repeated this afternoon because of worsening mental status and its negative for acute bleed/stroke. For now treating her with IV abx and antivirals for possible encephalitis.tomorrow she can have MRI with sedation.'spoke with neurologist  DR.Reynolds and Also informed her husband about this plan.continue iv fluids,PT consult,speech therapy eval.continue ASA,plavix,     All the records are reviewed and case discussed with Care Management/Social Workerr. Management plans discussed with the patient, family and they are in agreement.  CODE STATUS: DNR  TOTAL TIME TAKING CARE OF THIS PATIENT: 35 minutes.   POSSIBLE D/C IN 3-4DAYS, DEPENDING ON CLINICAL CONDITION.   Katha Hamming M.D on 08/06/2016 at 6:41 PM  Between 7am to 6pm - Pager - [phone]  After 6pm go to [email or web address] - password [email or web address] [facility]  [facility]  Office  [phone]  CC: Primary care physician; Alan Mulder, MD

## 2016-08-06 NOTE — Plan of Care (Signed)
Problem: Activity: Goal: Risk for activity intolerance will decrease Outcome: Progressing Pt will work with patient today.

## 2016-08-06 NOTE — Progress Notes (Signed)
PT Cancellation Note  Patient Details Name: Shannon Wilkerson MRN: [id number] DOB: [birth year]   Cancelled Treatment:    Reason Eval/Treat Not Completed: Patient declined, no reason specified.  Pt very agitated and has difficulty expressing her frustrations due to apparent expressive difficulties but requests not to see PT right now as she is waiting for her MRI.  PT will continue to follow acutely.   Encarnacion Chu PT, DPT 08/06/2016, 2:28 PM

## 2016-08-06 NOTE — Progress Notes (Signed)
Pt. Arrived via stretcher transferred to bed by staff. Tele applied, verified with Serina Cowper, RN. Skin assessment completed with Alisa, RN - bruising to left forearm. 2LNC. NIH screen preformed. Stroke pamphlet given to pt. Pt. Alert and oriented she is anxious and apologetic because she can not find words to express herself. No other deficits observed. Fluids started.

## 2016-08-06 NOTE — H&P (Signed)
[facility] - [place] @ [facility] Admission History and Physical [employer], D.O.  ---------------------------------------------------------------------------------------------------------------------   PATIENT NAMESharnell Wilkerson MR#: [id number] DATE OF BIRTH: 05/23/1945 DATE OF ADMISSION: 08/05/2016 PRIMARY CARE PHYSICIAN: Alan Mulder, MD  REQUESTING/REFERRING PHYSICIAN: ED Dr. Lenard Lance  CHIEF COMPLAINT: Chief Complaint  Patient presents with  . Hypertension  . Altered Mental Status    HISTORY OF PRESENT ILLNESS: Shannon Wilkerson is a 71 y.o. 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 States that her symptoms have not yet resolved, still feels confused.  Has trouble coming up with words to tell me what happened today. Rambling on insensibly about her husband. Complains of urinary frequency, fatigue, sadness and frustration but denies fevers/chills, weakness, dizziness, chest pain, shortness of breath, N/V/C/D, abdominal pain, dysuria/frequency.   Otherwise there has been no change in status. Patient has been taking medication as prescribed and there has been no recent change in medication or diet.  There has been no recent illness, travel or sick contacts.    EMS/ED COURSE:  Patient rec'd aspirin 325 and metoprolol 100mg   PAST MEDICAL HISTORY: Past Medical History:  Diagnosis Date  . Anemia    past hx  . Anxiety   . Asthma   . COPD (chronic obstructive pulmonary disease) (HCC)   . Hypertension   . Neck stiffness    limited movement s/p fusion c4-c5, c5-c6  . Shortness of breath dyspnea    exertion  . TIA (transient ischemic attack)    vision issues      PAST SURGICAL HISTORY: Past Surgical History:  Procedure Laterality Date  . CARDIAC CATHETERIZATION     "many yrs  ago"  . CATARACT EXTRACTION W/PHACO Left 01/17/2015   Procedure: CATARACT EXTRACTION PHACO AND INTRAOCULAR LENS PLACEMENT (IOC);  Surgeon: Sherald Hess, MD;  Location: [facility];  Service: Ophthalmology;  Laterality: Left;  . CERVICAL FUSION  1973, 1999   C4-5-6, C4-5  . DILATION AND CURETTAGE OF UTERUS  1999  . TONSILLECTOMY  1956      SOCIAL HISTORY: Social History  Substance Use Topics  . Smoking status: Light Tobacco Smoker    Years: 50.00    Types: Cigarettes  . Smokeless tobacco: Never Used     Comment: 0-2 cigs daily  . Alcohol use Yes     Comment: occasionally - 1 drink/6 months      FAMILY HISTORY: Family History  Problem Relation Age of Onset  . Stroke Mother   . CAD Mother   . CAD Father   . Alcohol abuse Brother      MEDICATIONS AT HOME: Prior to Admission medications   Medication Sig Start Date End Date Taking? Authorizing Provider  acetaminophen (TYLENOL) 325 MG tablet Take 2 tablets (650 mg total) by mouth every 6 (six) hours as needed for mild pain or fever. 03/02/15  Yes Aruna Gouru, MD  BREO ELLIPTA 200-25 MCG/INH AEPB Inhale 1 puff into the lungs daily. 06/05/16  Yes Historical Provider, MD  clopidogrel (PLAVIX) 75 MG tablet Take 75 mg by mouth daily.    Yes Historical Provider, MD  diphenhydramine-acetaminophen (TYLENOL PM) 25-500 MG TABS Take 2 tablets by mouth at bedtime as needed (for sleep).   Yes Historical Provider, MD  ibuprofen (ADVIL,MOTRIN) 200 MG tablet Take 600 mg by mouth every 6 (  six) hours as needed for mild pain.    Yes Historical Provider, MD  levalbuterol [facility] HFA) 45 MCG/ACT inhaler Inhale 2 puffs into the lungs every 6 (six) hours as needed for wheezing or shortness of breath.    Yes Historical Provider, MD  mometasone-formoterol (DULERA) 100-5 MCG/ACT AERO Inhale 2 puffs into the lungs 2 (two) times daily.   Yes Historical Provider, MD  roflumilast (DALIRESP) 500 MCG TABS tablet Take 500 mcg by mouth daily.    Yes Historical Provider, MD      DRUG ALLERGIES: Allergies  Allergen Reactions  . Fluvirin [Flu Virus Vaccine] Diarrhea  . Penicillins Hives and Other (See Comments)    Has patient had a PCN reaction causing immediate rash, facial/tongue/throat swelling, SOB or lightheadedness with hypotension: No Has patient had a PCN reaction causing severe rash involving mucus membranes or skin necrosis: No Has patient had a PCN reaction that required hospitalization No Has patient had a PCN reaction occurring within the last 10 years: No If all of the above answers are "NO", then may proceed with Cephalosporin use.  . Tetanus Toxoids Diarrhea     REVIEW OF SYSTEMS: CONSTITUTIONAL: No fever/chills, fatigue, weakness, weight gain/loss, headache EYES: No blurry or double vision. ENT: No tinnitus, postnasal drip, redness or soreness of the oropharynx. RESPIRATORY: No cough, wheeze, hemoptysis, dyspnea. CARDIOVASCULAR: No chest pain, orthopnea, palpitations, syncope. GASTROINTESTINAL: No nausea, vomiting, constipation, diarrhea, abdominal pain, hematemesis, melena or hematochezia. GENITOURINARY: No dysuria or hematuria. Positive urinary frequency ENDOCRINE: No polyuria or nocturia. No heat or cold intolerance. HEMATOLOGY: No anemia, bruising, bleeding. INTEGUMENTARY: No rashes, ulcers, lesions. MUSCULOSKELETAL: No arthritis, swelling, gout. NEUROLOGIC: Positive speech disturbance, confusino.  No numbness, tingling, weakness or ataxia. No seizure-type activity. PSYCHIATRIC: No anxiety, depression, insomnia. Positive depression  PHYSICAL EXAMINATION: VITAL SIGNS: Blood pressure (!) 200/108, pulse (!) 102, temperature 98.6 F (37 C), resp. rate 18, height  (1.6 m), weight 61.2 kg (135 lb), SpO2 100 %.  GENERAL: 71 y.o.-year-old female patient, weepy, no acute distress. Intermittently confused. HEENT: Head atraumatic, normocephalic. Pupils equal, round, reactive to light and accommodation. No  scleral icterus. Extraocular muscles intact. Nares are patent. Oropharynx is clear. Mucus membranes moist. NECK: Supple, full range of motion. No JVD, no bruit heard. No thyroid enlargement, no tenderness, no cervical lymphadenopathy. CHEST: Normal breath sounds bilaterally. No wheezing, rales, rhonchi or crackles. No use of accessory muscles of respiration.  No reproducible chest wall tenderness.  CARDIOVASCULAR: S1, S2 normal. No murmurs, rubs, or gallops. Cap refill <2 seconds. ABDOMEN: Soft, nontender, nondistended. No rebound, guarding, rigidity. Normoactive bowel sounds present in all four quadrants. No organomegaly or mass. EXTREMITIES: Full range of motion. No pedal edema, cyanosis, or clubbing. NEUROLOGIC: Dysarthric, struggles with words finding and tangential thought.  Cranial nerves II through XII are grossly intact with no focal sensorimotor deficit. Muscle strength 5/5 in all extremities. Sensation intact. Gait not checked. PSYCHIATRIC: The patient is alert and oriented x 1. Depressed mood, flattened affect, tearful. SKIN: Warm, dry, and intact without obvious rash, lesion, or ulcer.  LABORATORY PANEL:  CBC  Recent Labs Lab 08/05/16 2203  WBC 6.0  HGB 13.1  HCT 40.9  PLT 76*   ----------------------------------------------------------------------------------------------------------------- Chemistries  Recent Labs Lab 08/05/16 2203  NA 137  K 4.1  CL 92*  CO2 37*  GLUCOSE 132*  BUN 10  CREATININE 0.38*  CALCIUM 9.3  AST 20  ALT 14  ALKPHOS 64  BILITOT 0.7   ------------------------------------------------------------------------------------------------------------------ Cardiac Enzymes  Recent  Labs Lab 08/05/16 2203  TROPONINI <0.03   ------------------------------------------------------------------------------------------------------------------  RADIOLOGY: Ct Angio Head W Or Wo Contrast  Result Date: 08/05/2016 CLINICAL DATA:  Sudden lungs a  headache and altered mental status. Expressive aphasia. EXAM: CT ANGIOGRAPHY HEAD AND NECK TECHNIQUE: Multidetector CT imaging of the head and neck was performed using the standard protocol during bolus administration of intravenous contrast. Multiplanar CT image reconstructions and MIPs were obtained to evaluate the vascular anatomy. Carotid stenosis measurements (when applicable) are obtained utilizing NASCET criteria, using the distal internal carotid diameter as the denominator. CONTRAST:  75 mL Isovue 370 COMPARISON:  Head CT 08/05/2016 FINDINGS: CTA NECK FINDINGS Aortic arch: There is no aneurysm or dissection of the visualized ascending aorta or aortic arch. There is a normal 3 vessel branching pattern. There is atherosclerotic calcification within the proximal subclavian arteries without stenosis. There is calcific atherosclerosis of the aortic arch. Right carotid system: The right common carotid origin is widely patent. There is no common carotid or internal carotid artery dissection or aneurysm. Mild atherosclerosis of the carotid bifurcation with irregular linear plaque in the proximal internal carotid artery. No hemodynamically significant stenosis. Left carotid system: The left common carotid origin is widely patent. There is no common carotid or internal carotid artery dissection or aneurysm. Mild atherosclerosis of the carotid bifurcation without hemodynamically significant stenosis. Vertebral arteries: The vertebral system is codominant. Both vertebral artery origins are widely patent. Both vertebral arteries are normal to their confluence with the basilar artery. Skeleton: There is no bony spinal canal stenosis. No lytic or blastic lesions. Other neck: The nasopharynx is clear. The oropharynx and hypopharynx are normal. The epiglottis is normal. The supraglottic larynx, glottis and subglottic larynx are normal. No retropharyngeal collection. The parapharyngeal spaces are preserved. The parotid and  submandibular glands are normal. No sialolithiasis or salivary ductal dilatation. The thyroid gland is normal. There is no cervical lymphadenopathy. Upper chest: Bilateral emphysema. No focal consolidation or nodule/mass. Review of the MIP images confirms the above findings CTA HEAD FINDINGS Anterior circulation: --Intracranial internal carotid arteries: Mild atherosclerotic calcification without stenosis. --Anterior cerebral arteries: Normal. --Middle cerebral arteries: Normal. --Posterior communicating arteries: Absent bilaterally. Posterior circulation: --Posterior cerebral arteries: Normal. --Superior cerebellar arteries: Normal. --Basilar artery: Normal. --Anterior inferior cerebellar arteries: Normal. --Posterior inferior cerebellar arteries: Normal. Venous sinuses: As permitted by contrast timing, patent. Anatomic variants: None Delayed phase: Not performed. Review of the MIP images confirms the above findings IMPRESSION: 1. Normal intracranial CTA. 2. Bilateral mild internal carotid artery atherosclerosis without hemodynamically significant stenosis. 3. Aortic atherosclerosis. Electronically Signed   By: Deatra Robinson M.D.   On: 08/05/2016 23:40   Ct Head Wo Contrast  Result Date: 08/05/2016 CLINICAL DATA:  Code stroke.  Confusion. EXAM: CT HEAD WITHOUT CONTRAST TECHNIQUE: Contiguous axial images were obtained from the base of the skull through the vertex without intravenous contrast. COMPARISON:  Head CT 02/28/2015 FINDINGS: Brain: There is focal hypoattenuation within the medial right occipital lobe new from the prior study. No intracranial hemorrhage. No midline shift or other mass effect. No hydrocephalus. Brain parenchyma and CSF-containing spaces are otherwise normal for age. Vascular: No hyperdense vessel or unexpected calcification. Skull: Normal visualized skull base, calvarium and extracranial soft tissues. Sinuses/Orbits: No sinus fluid levels or advanced mucosal thickening. No mastoid  effusion. Normal orbits. ASPECTS [facility] Stroke Program Early CT Score) - Ganglionic level infarction (caudate, lentiform nuclei, internal capsule, insula, M1-M3 cortex): 7 - Supraganglionic infarction (M4-M6 cortex): 3 Total score (0-10 with 10 being normal):  10 IMPRESSION: 1. 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 By reported history, the patient has had a previous ischemic event involving visual deficits. In this context, this finding is expected to be subacute to chronic; however, MRI would provide more definitive characterization. 2. No intracranial hemorrhage or mass effect. 3. ASPECTS is 10. These results were called by telephone at the time of interpretation on 08/05/2016 at 10:28 pm to Dr. Minna Antis , who verbally acknowledged these results. Electronically Signed   By: Deatra Robinson M.D.   On: 08/05/2016 22:28   Ct Angio Neck W And/or Wo Contrast  Result Date: 08/05/2016 CLINICAL DATA:  Sudden lungs a headache and altered mental status. Expressive aphasia. EXAM: CT ANGIOGRAPHY HEAD AND NECK TECHNIQUE: Multidetector CT imaging of the head and neck was performed using the standard protocol during bolus administration of intravenous contrast. Multiplanar CT image reconstructions and MIPs were obtained to evaluate the vascular anatomy. Carotid stenosis measurements (when applicable) are obtained utilizing NASCET criteria, using the distal internal carotid diameter as the denominator. CONTRAST:  75 mL Isovue 370 COMPARISON:  Head CT 08/05/2016 FINDINGS: CTA NECK FINDINGS Aortic arch: There is no aneurysm or dissection of the visualized ascending aorta or aortic arch. There is a normal 3 vessel branching pattern. There is atherosclerotic calcification within the proximal subclavian arteries without stenosis. There is calcific atherosclerosis of the aortic arch. Right carotid system: The right common carotid origin is widely patent.  There is no common carotid or internal carotid artery dissection or aneurysm. Mild atherosclerosis of the carotid bifurcation with irregular linear plaque in the proximal internal carotid artery. No hemodynamically significant stenosis. Left carotid system: The left common carotid origin is widely patent. There is no common carotid or internal carotid artery dissection or aneurysm. Mild atherosclerosis of the carotid bifurcation without hemodynamically significant stenosis. Vertebral arteries: The vertebral system is codominant. Both vertebral artery origins are widely patent. Both vertebral arteries are normal to their confluence with the basilar artery. Skeleton: There is no bony spinal canal stenosis. No lytic or blastic lesions. Other neck: The nasopharynx is clear. The oropharynx and hypopharynx are normal. The epiglottis is normal. The supraglottic larynx, glottis and subglottic larynx are normal. No retropharyngeal collection. The parapharyngeal spaces are preserved. The parotid and submandibular glands are normal. No sialolithiasis or salivary ductal dilatation. The thyroid gland is normal. There is no cervical lymphadenopathy. Upper chest: Bilateral emphysema. No focal consolidation or nodule/mass. Review of the MIP images confirms the above findings CTA HEAD FINDINGS Anterior circulation: --Intracranial internal carotid arteries: Mild atherosclerotic calcification without stenosis. --Anterior cerebral arteries: Normal. --Middle cerebral arteries: Normal. --Posterior communicating arteries: Absent bilaterally. Posterior circulation: --Posterior cerebral arteries: Normal. --Superior cerebellar arteries: Normal. --Basilar artery: Normal. --Anterior inferior cerebellar arteries: Normal. --Posterior inferior cerebellar arteries: Normal. Venous sinuses: As permitted by contrast timing, patent. Anatomic variants: None Delayed phase: Not performed. Review of the MIP images confirms the above findings IMPRESSION:  1. Normal intracranial CTA. 2. Bilateral mild internal carotid artery atherosclerosis without hemodynamically significant stenosis. 3. Aortic atherosclerosis. Electronically Signed   By: Deatra Robinson M.D.   On: 08/05/2016 23:40    EKG: Sinus tach at 102bpm, normal axis, nonspecific ST and T wave changes  IMPRESSION AND PLAN:  This is a 71 y.o. female with a history of  anemia, anxiety, asthma, COPD, HTN, TIA  now being admitted with:  1. Dysarthria and global confusion, TIA rule out CVA -  -  Admit telemetry observation for neuro workup including: - Studies: MRA/MRI, Echo, Carotids - Labs: CBC, BMP, Lipids, TFTs, A1C.   - Nursing: Neurochecks, O2, dysphagia screen, permissive hypertension.  - Consults: Neurology, PT/OT, S/S consults.  - Meds: Daily aspirin .   Plavix - Fluids: IVNS@75cc /hr.   - Routine DVT Px: with Lovenox, SCDs, early ambulation  2. COPD, home O2-dependent  - Check ABG to assess for CO2 retention as a possible source of her confusion.  - Continue Breo, Daliresp, Dulera, Xopenex  3. Depression - Psychiatry consult    Code Status: Patient's husband reported that she has a living will and does not want resuscitation, however at this time patient is unable to make that decision.  She also appears to be depressed.  RN will ask husband to furnish living will at which time we can change code status to DNR.   All the records are reviewed and case discussed with ED provider.    TOTAL TIME TAKING CARE OF THIS PATIENT: 60 minutes.   Shannon Wilkerson D.O. on 08/06/2016 at 12:47 AM Between 7am to 6pm - Pager - [phone] After 6pm go to [email or web address] - Social research officer, government [facility] Office [phone] CC: Primary care physician; Alan Mulder, MD     Note: This dictation was prepared with Dragon dictation along with smaller phrase technology. Any transcriptional errors that result from this process are unintentional.

## 2016-08-06 NOTE — Care Management (Signed)
Patient presents from home with sx concerning for stroke. Work up in progress.  Neuro, PT OT SLP consults, brain MRI, [place] and echo pending.

## 2016-08-06 NOTE — Progress Notes (Signed)
Pt is unable to tolerate lying flat for MRI. MD notified. Orders for repeat CT Head received. I will continue to assess.

## 2016-08-06 NOTE — Evaluation (Cosign Needed)
Clinical/Bedside Swallow Evaluation Patient Details  Name: Shannon Wilkerson MRN: [id number] Date of Birth: 16-Jan-1946  Today's Date: 08/06/2016 Time: SLP Start Time (ACUTE ONLY): 1200 SLP Stop Time (ACUTE ONLY): 1300 SLP Time Calculation (min) (ACUTE ONLY): 60 min  Past Medical History:  Past Medical History:  Diagnosis Date  . Anemia    past hx  . Anxiety   . Asthma   . COPD (chronic obstructive pulmonary disease) (HCC)   . Hypertension   . Neck stiffness    limited movement s/p fusion c4-c5, c5-c6  . Shortness of breath dyspnea    exertion  . TIA (transient ischemic attack)    vision issues   Past Surgical History:  Past Surgical History:  Procedure Laterality Date  . CARDIAC CATHETERIZATION     "many yrs ago"  . CATARACT EXTRACTION W/PHACO Left 01/17/2015   Procedure: CATARACT EXTRACTION PHACO AND INTRAOCULAR LENS PLACEMENT (IOC);  Surgeon: Sherald Hess, MD;  Location: [facility];  Service: Ophthalmology;  Laterality: Left;  . CERVICAL FUSION  1973, 1999   C4-5-6, C4-5  . DILATION AND CURETTAGE OF UTERUS  1999  . TONSILLECTOMY  1956   HPI:  Pt is a 71 y.o. 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 States that her symptoms have not yet resolved, still feels confused.  Has trouble coming up with words to tell me what happened today. Rambling on insensibly about her husband. Complains of urinary frequency, fatigue, sadness and frustration but denies fevers/chills, weakness, dizziness, chest pain, shortness of breath, N/V/C/D, abdominal pain, dysuria/frequency.    Assessment / Plan / Recommendation Clinical Impression  Pt appears to present at mild risk for aspiration d/t pt's mental/cognitive status; pt very anxious and apologetic stating "I don't know whats wrong with  me". Pt appeared to have Cognitive-Linguistic defcits and often perseverated.  Pt see with trials of thin liquids and puree with no immediate, overt s/sx of aspiration noted following general aspiration precautions. No oral phase deficits noted (timely swallow, no anterior spillage). Pt did have complaints of "feeling sick" stating "I feel like i'm going to throw up", she belched after after bite/sip; pt does appear to have some feelings of N/V but it appeared to be more phlegm and saliva being expectorated. Pt appears very anxious and confused at times, apologetic and will speak in a few chunks of words that do not make semantic sense. Pt is able to say short, emotional phrases easier ("I'm not done with that", "can I have some water please?"). Suspect pt does have Cognitve-Linguistic deficits d/t suspected (new) CVA. Recommend dysphagia level 1 diet (full liquids today d/t N/V) d/t pt's mental/cognitive status with thin liquids (straws okay) following strict aspiration precuations; full supervision at all meals with assist for set up. Skilled ST services to f/u with diet toleration and upgrade if indicated. NSG updated.  SLP Visit Diagnosis: Dysphagia, oropharyngeal phase (R13.12)    Aspiration Risk  Mild aspiration risk;Moderate aspiration risk    Diet Recommendation   Dysphagia level 1 (puree) with thin liquids (straws okay) following strict aspiration precautions and full supervision at meals, with assist and set up. Full liquid diet today d/t pt's complaints of N/V.  Medication Administration: Whole meds with puree (crushed as able)    Other  Recommendations Oral Care Recommendations: Oral care BID  Follow up Recommendations  (TBD)      Frequency and Duration min 2x/week  1 week       Prognosis Prognosis for Safe Diet Advancement: Fair Barriers to Reach Goals: Behavior;Severity of deficits;Cognitive deficits      Swallow Study   General Date of Onset: 08/05/16 HPI: Pt is a 71 y.o.  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 States that her symptoms have not yet resolved, still feels confused.  Has trouble coming up with words to tell me what happened today. Rambling on insensibly about her husband. Complains of urinary frequency, fatigue, sadness and frustration but denies fevers/chills, weakness, dizziness, chest pain, shortness of breath, N/V/C/D, abdominal pain, dysuria/frequency.  Type of Study: Bedside Swallow Evaluation Previous Swallow Assessment: none noted Diet Prior to this Study: Regular;Thin liquids Temperature Spikes Noted: No Respiratory Status: Nasal cannula History of Recent Intubation: No Behavior/Cognition: Alert;Requires cueing (very anxious ) Oral Cavity Assessment: Within Functional Limits Oral Care Completed by SLP: No Oral Cavity - Dentition: Adequate natural dentition Vision: Functional for self-feeding Self-Feeding Abilities: Able to feed self;Needs assist;Needs set up Patient Positioning: Upright in bed (hunched over) Baseline Vocal Quality: Normal Volitional Cough:  (dnt) Volitional Swallow: Able to elicit    Oral/Motor/Sensory Function Overall Oral Motor/Sensory Function: Within functional limits   Ice Chips Ice chips: Not tested   Thin Liquid Thin Liquid: Within functional limits Presentation: Cup;Self Fed;Straw (X10 trials )    Nectar Thick Nectar Thick Liquid: Not tested   Honey Thick Honey Thick Liquid: Not tested   Puree Puree: Within functional limits Presentation: Self Fed (X5 trials )   Solid   GO   Solid: Not tested Other Comments: DNT d/t pt's cognitive/mental status        Ardelia Mems, B.S Graduate Clinician  08/06/2016,2:20 PM

## 2016-08-06 NOTE — Consult Note (Signed)
Referring Physician: Luberta Mutter    Chief Complaint: Difficulty with speech  HPI: Shannon Wilkerson is an 71 y.o. female who is unable to provide any history today due to her speech difficulties.  No family is available at this time.  Patient with a past medical history of anemia, asthma, COPD, TIA (visual deficits) who presents to the emergency department with difficulty speaking and confusion. According to the patient and family at approximately 4:30 PM yesterday they noted acute onset of the patient having difficulty speaking and expressing words. She appeared to be having significant trouble understanding what people were telling her as well. Family states this has never happened before.  Initial NIHSS of 2.    Date last known well: Date: 08/05/2016 Time last known well: Time: 16:30 tPA Given: No: Outside time window  Past Medical History:  Diagnosis Date  . Anemia    past hx  . Anxiety   . Asthma   . COPD (chronic obstructive pulmonary disease) (HCC)   . Hypertension   . Neck stiffness    limited movement s/p fusion c4-c5, c5-c6  . Shortness of breath dyspnea    exertion  . TIA (transient ischemic attack)    vision issues    Past Surgical History:  Procedure Laterality Date  . CARDIAC CATHETERIZATION     "many yrs ago"  . CATARACT EXTRACTION W/PHACO Left 01/17/2015   Procedure: CATARACT EXTRACTION PHACO AND INTRAOCULAR LENS PLACEMENT (IOC);  Surgeon: Sherald Hess, MD;  Location: [facility];  Service: Ophthalmology;  Laterality: Left;  . CERVICAL FUSION  1973, 1999   C4-5-6, C4-5  . DILATION AND CURETTAGE OF UTERUS  1999  . TONSILLECTOMY  1956    Family History  Problem Relation Age of Onset  . Stroke Mother   . CAD Mother   . CAD Father   . Alcohol abuse Brother    Social History:  reports that she has been smoking Cigarettes.  She has smoked for the past 50.00 years. She has never used smokeless tobacco. She reports that she drinks alcohol. She reports  that she does not use drugs.  Allergies:  Allergies  Allergen Reactions  . Fluvirin [Flu Virus Vaccine] Diarrhea  . Penicillins Hives and Other (See Comments)    Has patient had a PCN reaction causing immediate rash, facial/tongue/throat swelling, SOB or lightheadedness with hypotension: No Has patient had a PCN reaction causing severe rash involving mucus membranes or skin necrosis: No Has patient had a PCN reaction that required hospitalization No Has patient had a PCN reaction occurring within the last 10 years: No If all of the above answers are "NO", then may proceed with Cephalosporin use.  . Tetanus Toxoids Diarrhea    Medications:  I have reviewed the patient's current medications. Prior to Admission:  Prescriptions Prior to Admission  Medication Sig Dispense Refill Last Dose  . acetaminophen (TYLENOL) 325 MG tablet Take 2 tablets (650 mg total) by mouth every 6 (six) hours as needed for mild pain or fever.   prn  . BREO ELLIPTA 200-25 MCG/INH AEPB Inhale 1 puff into the lungs daily.  4   . clopidogrel (PLAVIX) 75 MG tablet Take 75 mg by mouth daily.    02/28/2015 at 1000  . diphenhydramine-acetaminophen (TYLENOL PM) 25-500 MG TABS Take 2 tablets by mouth at bedtime as needed (for sleep).   prn  . ibuprofen (ADVIL,MOTRIN) 200 MG tablet Take 600 mg by mouth every 6 (six) hours as needed for mild pain.  prn  . levalbuterol (XOPENEX HFA) 45 MCG/ACT inhaler Inhale 2 puffs into the lungs every 6 (six) hours as needed for wheezing or shortness of breath.    prn  . mometasone-formoterol (DULERA) 100-5 MCG/ACT AERO Inhale 2 puffs into the lungs 2 (two) times daily.   02/28/2015 at Unknown time  . roflumilast (DALIRESP) 500 MCG TABS tablet Take 500 mcg by mouth daily.   02/28/2015 at Unknown time   Scheduled: . aspirin EC  81 mg Oral Daily  . clopidogrel  75 mg Oral Daily  . enoxaparin (LOVENOX) injection  40 mg Subcutaneous Q24H  . fluticasone furoate-vilanterol  1 puff Inhalation  Daily  . mometasone-formoterol  2 puff Inhalation BID  . roflumilast  500 mcg Oral Daily    ROS: History obtained from the patient  General ROS: negative for - chills, fatigue, fever, night sweats, weight gain or weight loss Psychological ROS: negative for - behavioral disorder, hallucinations, memory difficulties, mood swings or suicidal ideation Ophthalmic ROS: negative for - blurry vision, double vision, eye pain or loss of vision ENT ROS: negative for - epistaxis, nasal discharge, oral lesions, sore throat, tinnitus or vertigo Allergy and Immunology ROS: negative for - hives or itchy/watery eyes Hematological and Lymphatic ROS: negative for - bleeding problems, bruising or swollen lymph nodes Endocrine ROS: negative for - galactorrhea, hair pattern changes, polydipsia/polyuria or temperature intolerance Respiratory ROS: negative for - cough, hemoptysis, shortness of breath or wheezing Cardiovascular ROS: negative for - chest pain, dyspnea on exertion, edema or irregular heartbeat Gastrointestinal ROS: abdominal pain, nausea, dry heaving Genito-Urinary ROS: negative for - dysuria, hematuria, incontinence or urinary frequency/urgency Musculoskeletal ROS: negative for - joint swelling or muscular weakness Neurological ROS: as noted in HPI Dermatological ROS: negative for rash and skin lesion changes  Physical Examination: Blood pressure (!) 160/82, pulse 72, temperature 97.6 F (36.4 C), resp. rate 18, height  (1.6 m), weight 61.2 kg (135 lb), SpO2 98 %.  HEENT-  Normocephalic, no lesions, without obvious abnormality.  Normal external eye and conjunctiva.  Normal TM's bilaterally.  Normal auditory canals and external ears. Normal external nose, mucus membranes and septum.  Normal pharynx. Cardiovascular- S1, S2 normal, pulses palpable throughout   Lungs- chest clear, no wheezing, rales, normal symmetric air entry Abdomen- soft, non-tender; bowel sounds normal; no masses,  no  organomegaly Extremities- no edema Lymph-no adenopathy palpable Musculoskeletal-no joint tenderness, deformity or swelling Skin-warm and dry, no hyperpigmentation, vitiligo, or suspicious lesions  Neurological Examination   Mental Status: Alert.  Initially speech was fluent but quickly patient became unable to express herself adequately.  Multiple paraphasic errors.  Nonfluent.  Able to follow commands. Cranial Nerves: II: Discs flat bilaterally; Visual fields grossly normal, pupils equal, round, reactive to light and accommodation III,IV, VI: ptosis not present, extra-ocular motions intact bilaterally V,VII: smile symmetric, facial light touch sensation normal bilaterally VIII: hearing normal bilaterally IX,X: gag reflex present XI: bilateral shoulder shrug XII: midline tongue extension Motor: Moves all extremities against gravity with no focal weakness noted.   Sensory: Responds to light touch throughout Deep Tendon Reflexes: 2+ and symmetric with absent AJ's bilaterally Plantars: Right: mute   Left: mute Cerebellar: Normal finger-to-nose testing bilaterally Gait: not tested due to safety concerns   Laboratory Studies:  Basic Metabolic Panel:  Recent Labs Lab 08/05/16 2203  NA 137  K 4.1  CL 92*  CO2 37*  GLUCOSE 132*  BUN 10  CREATININE 0.38*  CALCIUM 9.3    Liver Function  Tests:  Recent Labs Lab 08/05/16 2203  AST 20  ALT 14  ALKPHOS 64  BILITOT 0.7  PROT 7.7  ALBUMIN 4.5   No results for input(s): LIPASE, AMYLASE in the last 168 hours. No results for input(s): AMMONIA in the last 168 hours.  CBC:  Recent Labs Lab 08/05/16 2203  WBC 6.0  NEUTROABS 5.1  HGB 13.1  HCT 40.9  MCV 81.6  PLT 76*    Cardiac Enzymes:  Recent Labs Lab 08/05/16 2203 08/06/16 0303 08/06/16 0940  TROPONINI <0.03 <0.03 <0.03    BNP: Invalid input(s): POCBNP  CBG: No results for input(s): GLUCAP in the last 168 hours.  Microbiology: Results for orders  placed or performed during the hospital encounter of 11/30/14  Culture, routine-abscess     Status: None   Collection Time: 11/30/14  4:06 PM  Result Value Ref Range Status   Specimen Description FLUID  Final   Special Requests Normal  Final   Report Status 01/18/2015 FINAL  Final  Body fluid culture     Status: None   Collection Time: 11/30/14  4:06 PM  Result Value Ref Range Status   Specimen Description FLUID  Final   Special Requests Normal  Final   Gram Stain MODERATE WBC SEEN NO ORGANISMS SEEN   Final   Culture NO GROWTH 4 DAYS  Final   Report Status 12/04/2014 FINAL  Final    Coagulation Studies:  Recent Labs  08/05/16 2203  LABPROT 11.7  INR 0.86    Urinalysis:  Recent Labs Lab 08/05/16 2203  COLORURINE STRAW*  LABSPEC 1.011  PHURINE 8.0  GLUCOSEU NEGATIVE  HGBUR SMALL*  BILIRUBINUR NEGATIVE  KETONESUR 20*  PROTEINUR 30*  NITRITE NEGATIVE  LEUKOCYTESUR NEGATIVE    Lipid Panel:    Component Value Date/Time   CHOL 185 08/06/2016 0940   TRIG 89 08/06/2016 0940   HDL 75 08/06/2016 0940   CHOLHDL 2.5 08/06/2016 0940   VLDL 18 08/06/2016 0940   LDLCALC 92 08/06/2016 0940    HgbA1C:  Lab Results  Component Value Date   HGBA1C 4.9 03/01/2015    Urine Drug Screen:     Component Value Date/Time   LABOPIA NONE DETECTED 08/05/2016 2203   COCAINSCRNUR NONE DETECTED 08/05/2016 2203   LABBENZ POSITIVE (A) 08/05/2016 2203   AMPHETMU NONE DETECTED 08/05/2016 2203   THCU NONE DETECTED 08/05/2016 2203   LABBARB NONE DETECTED 08/05/2016 2203    Alcohol Level:  Recent Labs Lab 08/05/16 2203  ETH <5    Other results: EKG: sinus tachycardia at 102 bpm.  Imaging: Ct Angio Head W Or Wo Contrast  Result Date: 08/05/2016 CLINICAL DATA:  Sudden lungs a headache and altered mental status. Expressive aphasia. EXAM: CT ANGIOGRAPHY HEAD AND NECK TECHNIQUE: Multidetector CT imaging of the head and neck was performed using the standard protocol during bolus  administration of intravenous contrast. Multiplanar CT image reconstructions and MIPs were obtained to evaluate the vascular anatomy. Carotid stenosis measurements (when applicable) are obtained utilizing NASCET criteria, using the distal internal carotid diameter as the denominator. CONTRAST:  75 mL Isovue 370 COMPARISON:  Head CT 08/05/2016 FINDINGS: CTA NECK FINDINGS Aortic arch: There is no aneurysm or dissection of the visualized ascending aorta or aortic arch. There is a normal 3 vessel branching pattern. There is atherosclerotic calcification within the proximal subclavian arteries without stenosis. There is calcific atherosclerosis of the aortic arch. Right carotid system: The right common carotid origin is widely patent. There is no common  carotid or internal carotid artery dissection or aneurysm. Mild atherosclerosis of the carotid bifurcation with irregular linear plaque in the proximal internal carotid artery. No hemodynamically significant stenosis. Left carotid system: The left common carotid origin is widely patent. There is no common carotid or internal carotid artery dissection or aneurysm. Mild atherosclerosis of the carotid bifurcation without hemodynamically significant stenosis. Vertebral arteries: The vertebral system is codominant. Both vertebral artery origins are widely patent. Both vertebral arteries are normal to their confluence with the basilar artery. Skeleton: There is no bony spinal canal stenosis. No lytic or blastic lesions. Other neck: The nasopharynx is clear. The oropharynx and hypopharynx are normal. The epiglottis is normal. The supraglottic larynx, glottis and subglottic larynx are normal. No retropharyngeal collection. The parapharyngeal spaces are preserved. The parotid and submandibular glands are normal. No sialolithiasis or salivary ductal dilatation. The thyroid gland is normal. There is no cervical lymphadenopathy. Upper chest: Bilateral emphysema. No focal  consolidation or nodule/mass. Review of the MIP images confirms the above findings CTA HEAD FINDINGS Anterior circulation: --Intracranial internal carotid arteries: Mild atherosclerotic calcification without stenosis. --Anterior cerebral arteries: Normal. --Middle cerebral arteries: Normal. --Posterior communicating arteries: Absent bilaterally. Posterior circulation: --Posterior cerebral arteries: Normal. --Superior cerebellar arteries: Normal. --Basilar artery: Normal. --Anterior inferior cerebellar arteries: Normal. --Posterior inferior cerebellar arteries: Normal. Venous sinuses: As permitted by contrast timing, patent. Anatomic variants: None Delayed phase: Not performed. Review of the MIP images confirms the above findings IMPRESSION: 1. Normal intracranial CTA. 2. Bilateral mild internal carotid artery atherosclerosis without hemodynamically significant stenosis. 3. Aortic atherosclerosis. Electronically Signed   By: Deatra Robinson M.D.   On: 08/05/2016 23:40   Ct Head Wo Contrast  Result Date: 08/05/2016 CLINICAL DATA:  Code stroke.  Confusion. EXAM: CT HEAD WITHOUT CONTRAST TECHNIQUE: Contiguous axial images were obtained from the base of the skull through the vertex without intravenous contrast. COMPARISON:  Head CT 02/28/2015 FINDINGS: Brain: There is focal hypoattenuation within the medial right occipital lobe new from the prior study. No intracranial hemorrhage. No midline shift or other mass effect. No hydrocephalus. Brain parenchyma and CSF-containing spaces are otherwise normal for age. Vascular: No hyperdense vessel or unexpected calcification. Skull: Normal visualized skull base, calvarium and extracranial soft tissues. Sinuses/Orbits: No sinus fluid levels or advanced mucosal thickening. No mastoid effusion. Normal orbits. ASPECTS [facility] Stroke Program Early CT Score) - Ganglionic level infarction (caudate, lentiform nuclei, internal capsule, insula, M1-M3 cortex): 7 - Supraganglionic  infarction (M4-M6 cortex): 3 Total score (0-10 with 10 being normal): 10 IMPRESSION: 1. 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 By reported history, the patient has had a previous ischemic event involving visual deficits. In this context, this finding is expected to be subacute to chronic; however, MRI would provide more definitive characterization. 2. No intracranial hemorrhage or mass effect. 3. ASPECTS is 10. These results were called by telephone at the time of interpretation on 08/05/2016 at 10:28 pm to Dr. Minna Antis , who verbally acknowledged these results. Electronically Signed   By: Deatra Robinson M.D.   On: 08/05/2016 22:28   Ct Angio Neck W And/or Wo Contrast  Result Date: 08/05/2016 CLINICAL DATA:  Sudden lungs a headache and altered mental status. Expressive aphasia. EXAM: CT ANGIOGRAPHY HEAD AND NECK TECHNIQUE: Multidetector CT imaging of the head and neck was performed using the standard protocol during bolus administration of intravenous contrast. Multiplanar CT image reconstructions and MIPs were obtained to evaluate the vascular anatomy.  Carotid stenosis measurements (when applicable) are obtained utilizing NASCET criteria, using the distal internal carotid diameter as the denominator. CONTRAST:  75 mL Isovue 370 COMPARISON:  Head CT 08/05/2016 FINDINGS: CTA NECK FINDINGS Aortic arch: There is no aneurysm or dissection of the visualized ascending aorta or aortic arch. There is a normal 3 vessel branching pattern. There is atherosclerotic calcification within the proximal subclavian arteries without stenosis. There is calcific atherosclerosis of the aortic arch. Right carotid system: The right common carotid origin is widely patent. There is no common carotid or internal carotid artery dissection or aneurysm. Mild atherosclerosis of the carotid bifurcation with irregular linear plaque in the proximal internal carotid  artery. No hemodynamically significant stenosis. Left carotid system: The left common carotid origin is widely patent. There is no common carotid or internal carotid artery dissection or aneurysm. Mild atherosclerosis of the carotid bifurcation without hemodynamically significant stenosis. Vertebral arteries: The vertebral system is codominant. Both vertebral artery origins are widely patent. Both vertebral arteries are normal to their confluence with the basilar artery. Skeleton: There is no bony spinal canal stenosis. No lytic or blastic lesions. Other neck: The nasopharynx is clear. The oropharynx and hypopharynx are normal. The epiglottis is normal. The supraglottic larynx, glottis and subglottic larynx are normal. No retropharyngeal collection. The parapharyngeal spaces are preserved. The parotid and submandibular glands are normal. No sialolithiasis or salivary ductal dilatation. The thyroid gland is normal. There is no cervical lymphadenopathy. Upper chest: Bilateral emphysema. No focal consolidation or nodule/mass. Review of the MIP images confirms the above findings CTA HEAD FINDINGS Anterior circulation: --Intracranial internal carotid arteries: Mild atherosclerotic calcification without stenosis. --Anterior cerebral arteries: Normal. --Middle cerebral arteries: Normal. --Posterior communicating arteries: Absent bilaterally. Posterior circulation: --Posterior cerebral arteries: Normal. --Superior cerebellar arteries: Normal. --Basilar artery: Normal. --Anterior inferior cerebellar arteries: Normal. --Posterior inferior cerebellar arteries: Normal. Venous sinuses: As permitted by contrast timing, patent. Anatomic variants: None Delayed phase: Not performed. Review of the MIP images confirms the above findings IMPRESSION: 1. Normal intracranial CTA. 2. Bilateral mild internal carotid artery atherosclerosis without hemodynamically significant stenosis. 3. Aortic atherosclerosis. Electronically Signed   By:  Deatra Robinson M.D.   On: 08/05/2016 23:40    Assessment: 71 y.o. female presenting with aphasia.  Concern is for an embolic ischemic event.  Patient on Plavix at home.  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  CTA unremarkable.  LDL 92.  A1c pending.  Echocardiogram pending. Further work up recommended.     Stroke Risk Factors - hypertension and smoking  Plan: 1. HgbA1c pending 2. MRI of the brain without contrast pending 3. PT consult, OT consult, Speech consult 4. Echocardiogram pending 5. Statin for lipid management with target LDL<70. 6. Prophylactic therapy-ASA  and Plavix  daily 7. NPO until RN stroke swallow screen 8. Telemetry monitoring 9. Frequent neuro checks 10. Smoking cessation counseling    Thana Farr, MD Neurology [phone] 08/06/2016, 11:52 AM

## 2016-08-06 NOTE — Progress Notes (Signed)
Pt very anxious prior to going for echocardiogram, is very frustrated with her expressive aphasia and not being able to find words.    Pt given 0.5mg  alprazolam which she takes at home, and is reassured that the aphasia may improve, but it will just take time.  Pt also very self-conscious and embarrassed about needing help with toileting.    Emotional support is provided and pt is reassured that she does not need to be embarrassed and that the staff is happy to take care of her however she needs help.  Chaplain services also offered but declined.    Pt does seem more comfortable before leaving for echo.  Will continue to monitor and provide support as needed.

## 2016-08-06 NOTE — Progress Notes (Signed)
Pt transferred from 2A before shift change. VSS. Denies pain. NIH 2.

## 2016-08-07 DIAGNOSIS — R410 Disorientation, unspecified: Secondary | ICD-10-CM | POA: Diagnosis not present

## 2016-08-07 LAB — HEMOGLOBIN A1C
Hgb A1c MFr Bld: 5.3 % (ref 4.8–5.6)
Mean Plasma Glucose: 105 mg/dL

## 2016-08-07 LAB — ECHOCARDIOGRAM COMPLETE
HEIGHTINCHES: 62 in
Weight: 2203.2 oz

## 2016-08-07 LAB — GLUCOSE, CAPILLARY
GLUCOSE-CAPILLARY: 97 mg/dL (ref 65–99)
Glucose-Capillary: 94 mg/dL (ref 65–99)

## 2016-08-07 LAB — CREATININE, SERUM
CREATININE: 0.41 mg/dL — AB (ref 0.44–1.00)
GFR calc Af Amer: >60 mL/min (ref >60)

## 2016-08-07 MED ORDER — ROSUVASTATIN CALCIUM 20 MG PO TABS
20.0000 mg | ORAL_TABLET | Freq: Every day | ORAL | Status: DC
  Administered 2016-08-07 – 2016-08-09 (×3): 20 mg via ORAL
  Filled 2016-08-07 (×3): qty 1

## 2016-08-07 NOTE — Evaluation (Addendum)
Speech Language Pathology Evaluation Patient Details Name: Shannon Wilkerson MRN: [id number] DOB: [birth year] Today's Date: 08/07/2016 Time: 4098-1191 SLP Time Calculation (min) (ACUTE ONLY): 60 min  Problem List:  Patient Active Problem List   Diagnosis Date Noted  . CVA (cerebral vascular accident) (HCC) 08/06/2016  . Acute delirium 08/06/2016  . Major depression 03/01/2015  . Generalized anxiety disorder 03/01/2015  . CVA (cerebral infarction) 10/27/2014  . COPD (chronic obstructive pulmonary disease) (HCC) 10/27/2014  . Chronic respiratory failure (HCC) 10/27/2014  . Anxiety 10/27/2014  . Hypertension 10/27/2014   Past Medical History:  Past Medical History:  Diagnosis Date  . Anemia    past hx  . Anxiety   . Asthma   . COPD (chronic obstructive pulmonary disease) (HCC)   . Hypertension   . Neck stiffness    limited movement s/p fusion c4-c5, c5-c6  . Shortness of breath dyspnea    exertion  . TIA (transient ischemic attack)    vision issues   Past Surgical History:  Past Surgical History:  Procedure Laterality Date  . CARDIAC CATHETERIZATION     "many yrs ago"  . CATARACT EXTRACTION W/PHACO Left 01/17/2015   Procedure: CATARACT EXTRACTION PHACO AND INTRAOCULAR LENS PLACEMENT (IOC);  Surgeon: Sherald Hess, MD;  Location: [facility];  Service: Ophthalmology;  Laterality: Left;  . CERVICAL FUSION  1973, 1999   C4-5-6, C4-5  . DILATION AND CURETTAGE OF UTERUS  1999  . TONSILLECTOMY  1956   HPI:  Pt is a 71 y.o. 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 States that her symptoms have not yet resolved, still feels confused.  Has trouble coming up with words to tell me what happened today. Rambling on insensibly about her husband. Complains of urinary frequency,  fatigue, sadness and frustration but denies fevers/chills, weakness, dizziness, chest pain, shortness of breath, N/V/C/D, abdominal pain, dysuria/frequency.  Initially, pt presented w/ moderate expressive language deficits impairing her ability to verbally communicate w/ others. Today, this is much improved ("I am amazed at how improved it is", stated the husband), and pt is able to verbally communicate w/ staff and husband adequately. (Addendum)  Assessment / Plan / Recommendation Clinical Impression  Pt appears tp present with mild-moderate attention, problem solving,  topic maintence and pragmatic deficits after cog/language screening this date.  This may be exacerbated in the current setting of acute illness. Unsure of pt's baseline status in the home.  Pt  successfully able to auditory comprehension questions without noted difficulty or increased response time. Pt able to successfully answer yes/no questions, factual,  mulitple step (3) commands and fill in the blank with familiar word pairs. Pt oriented X4. Pt exhibits difficulty in sustained attention and topic maintence at the conversational level which can signicantly limit conversations with others. Pt exhibited mild difficulty with low level attention tasks (raising hand every time the letter 2 is stated)- pt missed 2/10.  Throughout eval, pt often changed topic to how she wasn't feeling well, making comments such as "I always feel like I have to pee". Pt also did  have complaints of needing her inhaler and often times commented she was not able to take deep breaths to catch her breath.  Her presentation (topic attention, maintence of topic and pragmatics) impacts the ability for listener to easily engage  in conversation. F/u for cognitive-communication skills to enhance attention during conversation, as well as her pragmatic interactions. Pt frequently perseversated on topics/thoughts which frequently distracted ability to attend during communication  tasks (pt frequently stated "i need my inhaler to take deep breaths" and I just don't feel well". Unsure if this occurs at her baseline or if this is exacerbated in light of current illness. NSG made aware of pt's issues.     SLP Assessment  SLP Recommendation/Assessment: All further Speech Lanaguage Pathology  needs can be addressed in the next venue of care (Outpatient therapy TBD per pt/husband ) SLP Visit Diagnosis: Attention and concentration deficit (pramatic awareness )    Follow Up Recommendations       Frequency and Duration           SLP Evaluation Cognition  Overall Cognitive Status: Within Functional Limits for tasks assessed Arousal/Alertness: Awake/alert Orientation Level: Oriented X4 Attention: Focused;Alternating;Sustained Focused Attention: Appears intact Sustained Attention: Impaired Sustained Attention Impairment: Verbal complex Alternating Attention: Impaired Alternating Attention Impairment: Verbal complex Memory: Appears intact Awareness: Appears intact Problem Solving: Appears intact Executive Function: Reasoning;Decision Making;Initiating;Self Monitoring Reasoning: Impaired Reasoning Impairment: Functional complex Decision Making: Appears intact Initiating: Appears intact Self Monitoring: Appears intact Behaviors: Restless Safety/Judgment: Appears intact [place] Scales of Cognitive Functioning: Purposeful/appropriate       Comprehension  Auditory Comprehension Overall Auditory Comprehension: Appears within functional limits for tasks assessed Yes/No Questions: Within Functional Limits Commands: Within Functional Limits Conversation: Complex Other Conversation Comments: Pt very anxious and not able to sustain attention for more than a few minutes without changing subject Interfering Components: Attention;Pain;Anxiety EffectiveTechniques: Slowed speech Visual Recognition/Discrimination Discrimination: Within Function Limits Reading  Comprehension Reading Status: Not tested    Expression Expression Primary Mode of Expression: Verbal Verbal Expression Overall Verbal Expression: Appears within functional limits for tasks assessed Initiation: No impairment Automatic Speech: Name;Social Response;Day of week;Month of year Level of Generative/Spontaneous Verbalization: Conversation Repetition: No impairment Naming: No impairment Pragmatics: Impairment Impairments: Topic appropriateness;Topic maintenance;Turn Taking Interfering Components: Attention Non-Verbal Means of Communication: Not applicable Written Expression Dominant Hand: Right Written Expression: Not tested   Oral / Motor  Oral Motor/Sensory Function Overall Oral Motor/Sensory Function: Within functional limits Motor Speech Overall Motor Speech: Appears within functional limits for tasks assessed Respiration: Impaired Level of Impairment: Sentence Phonation: Normal Resonance: Within functional limits Articulation: Within functional limitis Intelligibility: Intelligible Motor Planning: Witnin functional limits Motor Speech Errors: Not applicable   GO                    Ardelia Mems, B.S Graduate Clinician  08/07/2016, 11:53 AM  This information has been reviewed and agreed upon by this supervising clinician.  Jerilynn Som, MS, CCC-SLP

## 2016-08-07 NOTE — Evaluation (Signed)
Physical Therapy Evaluation Patient Details Name: Shannon Wilkerson MRN: [id number] DOB: 1946-03-22 Today's Date: 08/07/2016   History of Present Illness  presented to ER secondary to speech difficulty, AMS; admitted for TIA/CVA work-up.  All imaging negative for acute change; noted chronic R PCA/occipital infarct. Patient currently declining LP per neuro notes.  Clinical Impression  Upon evaluation, patient alert and oriented to basic information; follows simple commands and demonstrates fair insight.  Mild short-term memory deficits, mild word-finding difficulties persist.  Bilat UE/LE strength and ROM grossly symmetrical and WFL; no focal weakness or sensory deficit noted.  Able to complete bed mobility with close sup; sit/stand, basic transfers and gait (30') without assist device, min assist. Patient maintaining forward flexed posture, constantly reaching for furniture during gait trial.  Improved to cga/min assist with use of RW (30'); do recommend continued use of RW at this time. Patient voiced understanding. Would benefit from skilled PT to address above deficits and promote optimal return to PLOF; Recommend transition to HHPT and upon discharge from acute hospitalization.  Of note, patient with multiple comments/verbalizations about husband and his anger, aggression in the home environment.  RNCM, RN informed/aware; CSW consult placed for additional assessment of home situation.     Follow Up Recommendations Home health PT;Supervision/Assistance - 24 hour    Equipment Recommendations  Rolling walker with 5" wheels    Recommendations for Other Services       Precautions / Restrictions Precautions Precautions: Fall Restrictions Weight Bearing Restrictions: No      Mobility  Bed Mobility Overal bed mobility: Needs Assistance Bed Mobility: Supine to Sit     Supine to sit: Supervision     General bed mobility comments: increased time, use of bedrails  required  Transfers Overall transfer level: Needs assistance Equipment used: Rolling walker (2 wheeled) Transfers: Sit to/from Stand Sit to Stand: Min guard         General transfer comment: cuing for hand placement; poor carry-over between trials. Fair/good LE strength and power  Ambulation/Gait Ambulation/Gait assistance: Architect (Feet): 30 Feet Assistive device: None       General Gait Details: narrowed BOS with inconsistent step height/length, constantly reaching for furniture for external stabilization; generally unsteady  Stairs            Wheelchair Mobility    Modified Rankin (Stroke Patients Only)       Balance Overall balance assessment: Needs assistance Sitting-balance support: No upper extremity supported;Feet supported Sitting balance-Leahy Scale: Good Sitting balance - Comments: able to maintain sitting balance without LOB during BUE MMT while feet unsupported     Standing balance support: Bilateral upper extremity supported Standing balance-Leahy Scale: Fair Standing balance comment: limited functional reach in standing, approx 3-4" from immediate BOS prior to seeking external stabilization                             Pertinent Vitals/Pain Pain Assessment: Faces Pain Score: 7  Faces Pain Scale: Hurts little more Pain Location: L shoulder Pain Descriptors / Indicators: Grimacing Pain Intervention(s): Limited activity within patient's tolerance;Monitored during session    Home Living Family/patient expects to be discharged to:: Private residence Living Arrangements: Spouse/significant other Available Help at Discharge: Family Type of Home: House Home Access: Stairs to enter Entrance Stairs-Rails: Right;Left;Can reach both Entrance Stairs-Number of Steps: 6 Home Layout: Two level;Full bath on main level;Bed/bath upstairs Home Equipment: Walker - 2 wheels;Cane - single point;Shower seat;Grab  bars -  tub/shower Additional Comments: pt denies any falls in past 12 months    Prior Function Level of Independence: Independent         Comments: Indep with ADLs, household ambulation without assist device; denies recent fall history.     Hand Dominance   Dominant Hand: Right    Extremity/Trunk Assessment   Upper Extremity Assessment Upper Extremity Assessment: Overall WFL for tasks assessed    Lower Extremity Assessment Lower Extremity Assessment: Overall WFL for tasks assessed (grossly at least 4+/5 throughout; denies paresthesia)    Cervical / Trunk Assessment Cervical / Trunk Assessment: Normal  Communication   Communication:  (able to communicate needs/wants appropriately during session; intermittent word-finding difficulty, but able to work-around with increased time)  Cognition Arousal/Alertness: Awake/alert Behavior During Therapy: WFL for tasks assessed/performed Overall Cognitive Status: Within Functional Limits for tasks assessed                                 General Comments: follows simple, one-step commands; appropriate interaction.  3/3 immediate recall, 2/3 delayed recall (3/3 with min cuing).      General Comments General comments (skin integrity, edema, etc.): RUE IV sites on forearm and hand noted to be leaking, RN notified, came to check    Exercises     Assessment/Plan    PT Assessment Patient needs continued PT services  PT Problem List Decreased strength;Decreased range of motion;Decreased activity tolerance;Decreased balance;Decreased mobility;Decreased coordination;Decreased cognition;Decreased knowledge of use of DME;Decreased safety awareness;Decreased knowledge of precautions       PT Treatment Interventions DME instruction;Gait training;Stair training;Functional mobility training;Therapeutic activities;Therapeutic exercise;Balance training;Neuromuscular re-education;Patient/family education    PT Goals (Current goals can be  found in the Care Plan section)  Acute Rehab PT Goals Patient Stated Goal: get better PT Goal Formulation: With patient Time For Goal Achievement: 08/21/16 Potential to Achieve Goals: Good    Frequency Min 2X/week   Barriers to discharge        Co-evaluation PT/OT/SLP Co-Evaluation/Treatment: Yes Reason for Co-Treatment: To address functional/ADL transfers PT goals addressed during session: Mobility/safety with mobility;Balance;Proper use of DME OT goals addressed during session: ADL's and self-care;Proper use of Adaptive equipment and DME       End of Session Equipment Utilized During Treatment: Gait belt;Oxygen Activity Tolerance: Patient tolerated treatment well Patient left: in chair;with call bell/phone within reach;with chair alarm set (OT present in room to complete evaluation) Nurse Communication: Mobility status PT Visit Diagnosis: Unsteadiness on feet (R26.81)    Time: 4098-1191 PT Time Calculation (min) (ACUTE ONLY): 36 min   Charges:   PT Evaluation $PT Eval Low Complexity: 1 Procedure     PT G Codes:   PT G-Codes **NOT FOR INPATIENT CLASS** Functional Assessment Tool Used: AM-PAC 6 Clicks Basic Mobility Functional Limitation: Mobility: Walking and moving around Mobility: Walking and Moving Around Current Status (Y7829): At least 40 percent but less than 60 percent impaired, limited or restricted Mobility: Walking and Moving Around Goal Status [phone]): At least 1 percent but less than 20 percent impaired, limited or restricted    Bryam Taborda H. Manson Passey, PT, DPT, NCS 08/07/16, 4:20 PM [phone]

## 2016-08-07 NOTE — Evaluation (Signed)
Occupational Therapy Evaluation Patient Details Name: Shannon Wilkerson MRN: [id number] DOB: 16-Sep-1945 Today's Date: 08/07/2016    History of Present Illness 70yo female pt presented to ER secondary to speech difficulty, AMS; admitted for TIA/CVA work-up.  All imaging negative for acute change; noted chronic R PCA/occipital infarct. Patient currently declining LP per neuro notes.   Clinical Impression   Pt seen for OT evaluation this date. Pt was independent at baseline, however, increasingly socially isolated per pt report and limited due relying on spouse for driving more recently. Pt reports that she does drive, but hadn't for the past few weeks. Pt presents with some confusion, improving expressive language skills and able to appropriately answer questions and follow multiple step commands well. Pt generally at min guard - supervision level for self care tasks, and would benefit from skilled OT services to address noted impairments in cognition, activity tolerance and functional deficits in order to maximize safety and return to PLOF. Social work consult placed to assess home safety/family supports based on conversation with pt during session. Pt reports spouse has "anger issues" and that she would be "better off dead than going home", when asked if she felt safe to return home, pt stated "yes" with hesitation in response time noted. Recommend HHOT and 24 hour supervision to maximize safety after discharge from hospital.    Follow Up Recommendations  Home health OT;Supervision/Assistance - 24 hour    Equipment Recommendations  None recommended by OT    Recommendations for Other Services       Precautions / Restrictions Precautions Precautions: Fall Restrictions Weight Bearing Restrictions: No      Mobility Bed Mobility Overal bed mobility: Needs Assistance Bed Mobility: Supine to Sit     Supine to sit: Supervision     General bed mobility comments: increased time, use of  bedrails required  Transfers Overall transfer level: Needs assistance Equipment used: Rolling walker (2 wheeled) Transfers: Sit to/from Stand Sit to Stand: Min guard         General transfer comment: cuing for hand placement; poor carry-over between trials. Fair/good LE strength and power    Balance Overall balance assessment: Needs assistance Sitting-balance support: No upper extremity supported;Feet supported Sitting balance-Leahy Scale: Good Sitting balance - Comments: able to maintain sitting balance without LOB during BUE MMT while feet unsupported     Standing balance support: Bilateral upper extremity supported Standing balance-Leahy Scale: Fair Standing balance comment: limited functional reach in standing, approx 3-4" from immediate BOS prior to seeking external stabilization                           ADL either performed or assessed with clinical judgement   ADL Overall ADL's : Needs assistance/impaired Eating/Feeding: Sitting;Set up   Grooming: Sitting;Set up   Upper Body Bathing: Supervision/ safety;Set up;Sitting   Lower Body Bathing: Supervison/ safety;Set up;Sitting/lateral leans   Upper Body Dressing : Set up;Supervision/safety;Sitting   Lower Body Dressing: Min guard;Sit to/from stand;Sitting/lateral leans;Set up   Toilet Transfer: Min guard;Grab bars;Ambulation;RW   Toileting- Clothing Manipulation and Hygiene: Sitting/lateral lean;Supervision/safety       Functional mobility during ADLs: Min guard;Rolling walker General ADL Comments: pt generally supervision level for ADL tasks, improved functional mobility with RW versus no AD     Vision Baseline Vision/History: Wears glasses Wears Glasses: At all times Patient Visual Report: No change from baseline Vision Assessment?: Yes Eye Alignment: Within Functional Limits Ocular Range of Motion: Within  Functional Limits Alignment/Gaze Preference: Within Defined Limits Tracking/Visual  Pursuits: Able to track stimulus in all quads without difficulty Convergence: Within functional limits Visual Fields: No apparent deficits     Perception     Praxis Praxis Praxis tested?: Within functional limits    Pertinent Vitals/Pain Pain Assessment: Faces Pain Score: 7  Faces Pain Scale: Hurts little more Pain Location: L shoulder Pain Descriptors / Indicators: Grimacing Pain Intervention(s): Limited activity within patient's tolerance;Monitored during session     Hand Dominance Right   Extremity/Trunk Assessment Upper Extremity Assessment Upper Extremity Assessment: Overall WFL for tasks assessed   Lower Extremity Assessment Lower Extremity Assessment: Overall WFL for tasks assessed (grossly at least 4+/5 throughout; denies paresthesia)   Cervical / Trunk Assessment Cervical / Trunk Assessment: Normal   Communication Communication Communication:  (able to communicate needs/wants appropriately during session; intermittent word-finding difficulty, but able to work-around with increased time)   Cognition Arousal/Alertness: Awake/alert Behavior During Therapy: WFL for tasks assessed/performed Overall Cognitive Status: Within Functional Limits for tasks assessed                                 General Comments: follows simple, one-step commands; appropriate interaction.  3/3 immediate recall, 2/3 delayed recall (3/3 with min cuing).   General Comments  RUE IV sites on forearm and hand noted to be leaking, RN notified, came to check    Exercises     Shoulder Instructions      Home Living Family/patient expects to be discharged to:: Private residence Living Arrangements: Spouse/significant other Available Help at Discharge: Family Type of Home: House Home Access: Stairs to enter [employer] of Steps: 6 Entrance Stairs-Rails: Right;Left;Can reach both Home Layout: Two level;Full bath on main level;Bed/bath upstairs Alternate Level  Stairs-Number of Steps: 11 steps w/ rail on L per previous encounter/chart review Alternate Level Stairs-Rails: Left Bathroom Shower/Tub: Tub/shower unit;Walk-in shower;Door (uses walk in shower primarily)   Bathroom Toilet: Standard Bathroom Accessibility: Yes How Accessible: Accessible via walker;Other (comment) ("probably would fit" per pt report) Home Equipment: Walker - 2 wheels;Cane - single point;Shower seat;Grab bars - tub/shower   Additional Comments: pt denies any falls in past 12 months  Lives With: Spouse    Prior Functioning/Environment Level of Independence: Independent        Comments: Indep with ADLs, household ambulation without assist device; denies recent fall history.        OT Problem List: Pain;Cardiopulmonary status limiting activity;Decreased cognition;Decreased safety awareness;Decreased activity tolerance      OT Treatment/Interventions: Self-care/ADL training;Neuromuscular education;Cognitive remediation/compensation;Patient/family education;Energy conservation    OT Goals(Current goals can be found in the care plan section) Acute Rehab OT Goals Patient Stated Goal: get better OT Goal Formulation: With patient Time For Goal Achievement: 08/21/16 Potential to Achieve Goals: Good  OT Frequency: Min 1X/week   Barriers to D/C: Decreased caregiver support  Unsure amount of support spouse able/willing to provide- please see clinical impression       Co-evaluation PT/OT/SLP Co-Evaluation/Treatment: Yes Reason for Co-Treatment: Necessary to address cognition/behavior during functional activity;For patient/therapist safety PT goals addressed during session: Mobility/safety with mobility;Proper use of DME;Balance OT goals addressed during session: ADL's and self-care      End of Session Equipment Utilized During Treatment: Gait belt;Rolling walker;Oxygen  Activity Tolerance: Patient tolerated treatment well Patient left: in chair;with call bell/phone  within reach;with chair alarm set  OT Visit Diagnosis: Other abnormalities of gait and mobility (  R26.89);Other symptoms and signs involving cognitive function;Pain Pain - Right/Left: Left Pain - part of body: Shoulder                Time: 1610-9604 OT Time Calculation (min): 59 min Charges:  OT General Charges $OT Visit: 1 Procedure OT Evaluation $OT Eval Moderate Complexity: 1 Procedure OT Treatments $Self Care/Home Management : 38-52 mins G-Codes: OT G-codes **NOT FOR INPATIENT CLASS** Functional Assessment Tool Used: AM-PAC 6 Clicks Daily Activity;Clinical judgement Functional Limitation: Self care Self Care Current Status (V4098): At least 20 percent but less than 40 percent impaired, limited or restricted Self Care Goal Status (J1914): At least 1 percent but less than 20 percent impaired, limited or restricted   Richrd Prime, MPH, MS, OTR/L ascom [phone] 08/07/16, 4:18 PM

## 2016-08-07 NOTE — Progress Notes (Signed)
At approx 2350 on 08/06/16, pt found to be sitting upright in bed and appears uncomfortable.  Initially not answering questions or following commands.  Replies only "yeah" to questions asked.  Seemingly having increased confusion/delirium.  Pt stating that "I just need to get, I just need to get" but unable to ascertain what pt means by this.  Pt either unable or unwilling to cooperate with a neuro exam but does move all extremities freely, no obvious deficits, face is symmetric.  Dr. Emmit Pomfret called for further assessment.  Pt more cooperative with neuro exam for Dr. Emmit Pomfret.  No focal neuro deficits found except the expressive aphasia.  Dr. Emmit Pomfret states that this is a fluctuating baseline for this patient.  Recommends O2 titration so patient has O2 sat in 88 to 92% range.  Also may give Zyprexa (Zyprexa not given previously as pt was at echocardiogram) if pt still unable to sleep.  O2 reduced to 1L [place] with O2 sat of 91%.  Zyprexa given as pt still somewhat distraught, anxious, and depressed.  Time spent in room with patient is nearly 90 minutes  At approx 0130 on 08/07/16 pt helped to bedside commode with one occurrence of urine and stool.  Pt helped back to bed and agrees to try to rest.  Pt comfortably asleep at 0230.  Will continue to monitor closely.

## 2016-08-07 NOTE — Progress Notes (Signed)
PT Cancellation Note  Patient Details Name: Shannon Wilkerson MRN: [id number] DOB: 1945-05-04   Cancelled Treatment:    Reason Eval/Treat Not Completed: Other (comment) (Evaluation attempted. Patient currently with ST; will re-attempt at later time this date as patient available and medically appropriate.)  Ortha Metts H. Manson Passey, PT, DPT, NCS 08/07/16, 10:40 AM [phone]

## 2016-08-07 NOTE — Care Management (Signed)
Admitted to this facility with the diagnosis of acute delirium under observation status. Lives with husband, Baldo Ash, [phone]). Blood work was done last week by Dr. Patrecia Pace. Sees Drs Sherryll Burger and Welton Flakes. Prescriptions are filled at [facility] in [place]. Home Health per [facility] 02/2015. [facility] oxygen per [facility] for many years. 2.5 liters per nasal cannula continuous. No skilled facility. Raised toilet seat and rolling walker in the home. No falls. Decreased appetite. Lost 20 pounds over the years. Takes care of all basic activities of daily living herself. Husband will transport. Gwenette Greet RN MSN CCM Care Management [phone]

## 2016-08-07 NOTE — Consult Note (Signed)
[facility] Consult   Reason for Consult:  Consult for 71 year old woman with a history of depression and strokes currently in the hospital with altered mental status Referring Physician:  Vianne Bulls Patient Identification: Shannon Wilkerson MRN:  [id number] Principal Diagnosis: Acute delirium Diagnosis:   Patient Active Problem List   Diagnosis Date Noted  . CVA (cerebral vascular accident) ([place]) [I63.9] 08/06/2016  . Acute delirium [R41.0] 08/06/2016  . Major depression [F32.9] 03/01/2015  . Generalized anxiety disorder [F41.1] 03/01/2015  . CVA (cerebral infarction) [I63.9] 10/27/2014  . COPD (chronic obstructive pulmonary disease) ([place]) [J44.9] 10/27/2014  . Chronic respiratory failure ([place]) [J96.10] 10/27/2014  . Anxiety [F41.9] 10/27/2014  . Hypertension [I10] 10/27/2014    Total Time spent with patient: 30 minutes  Subjective:   Shannon Wilkerson is a 71 y.o. female patient admitted with "it is, you know, that thing".  Follow-up for Tuesday the 70th. 71 year old woman who presented with altered mental status possible stroke. On reevaluation today her mental status is dramatically different from what we saw yesterday. The "akathisia" is almost entirely resolved. She is able to give a history and answer questions appropriately. She has some memory gaps and has very little memory of how she was yesterday. She is able to describe however in more detail how she has been under a lot of stress at home feeling like her husband's behavior and anger are oppressive to her. Feeling very nervous all of the time. Patient is denying any current suicidal thoughts . she is however still very depressed.. Today she was able to eat normally. Not showing any new neurologic signs. Tolerated medication well last night.  HPI:  Patient interviewed chart reviewed. 71 year old woman with a history of past strokes and a history of depression brought into the hospital with acute mental status changes.  Patient was awake and interactive during the interview but has a great deal of difficulty communicating. She was not able to give any clear lucid account of her history. Parent lately was brought in yesterday with the family reporting an acute onset of change in her communication. Patient is being worked up for stroke. Has been seen by neurology already. Some of the workup is completed. Patient's speech varies in quality during the interview. She is able to tell me her name and is able to answer some other questions appropriately. She is able to follow simple one-step commands without difficulty. At other times however she will appear to not understand questions and will begin to repeat herself and use language that doesn't make sense. Patient was not able to tell me what medication she had been taking recently. I was able to see from the controlled substance database that she is still prescribed Xanax 0.5 mg twice a day. Patient talks several times about her husband being angry with her. Seems to have some degree of emotional distress but difficulty communicating it. She also on several occasions said "I might as well die". When saying this she did not seem to be particularly more distraught or emotional than other times and was not able to elaborate on it.  Social history: Patient lives with her husband. No children. Does not work outside the home. Sounds like there is extended family involved socially.  Medical history: She has a documented past history of strokes. History of elevated blood pressure. COPD.  Substance abuse history: Doing no history of substance abuse. Chronic use of modest doses of benzodiazepines without any documented abuse.  Past Psychiatric History: I have  seen this patient once before in late 2016. Consult at that time was also for depression. She was more lucid and did not have this kind of mental state status. She did however have some odd symptoms such as a difficulty getting  songs out of her head. She was being treated with low-dose olanzapine, low dose Xanax and Effexor at that time for a diagnosis of depression. No history of suicide attempts. No history of hospitalization. No known history of bipolar disorder.  Risk to Self: Is patient at risk for suicide?: No Risk to Others:   Prior Inpatient Therapy:   Prior Outpatient Therapy:    Past Medical History:  Past Medical History:  Diagnosis Date  . Anemia    past hx  . Anxiety   . Asthma   . COPD (chronic obstructive pulmonary disease) ([place])   . Hypertension   . Neck stiffness    limited movement s/p fusion c4-c5, c5-c6  . Shortness of breath dyspnea    exertion  . TIA (transient ischemic attack)    vision issues    Past Surgical History:  Procedure Laterality Date  . CARDIAC CATHETERIZATION     "many yrs ago"  . CATARACT EXTRACTION W/PHACO Left 01/17/2015   Procedure: CATARACT EXTRACTION PHACO AND INTRAOCULAR LENS PLACEMENT (IOC);  Surgeon: Ronnell Freshwater, MD;  Location: [facility];  Service: Ophthalmology;  Laterality: Left;  . [place]   C4-5-6, C4-5  . DILATION AND CURETTAGE OF UTERUS  1999  . TONSILLECTOMY  1956   Family History:  Family History  Problem Relation Age of Onset  . Stroke Mother   . CAD Mother   . CAD Father   . Alcohol abuse Brother    Family Psychiatric  History: Unknown Social History:  History  Alcohol Use  . Yes    Comment: occasionally - 1 drink/6 months     History  Drug Use No    Social History   Social History  . Marital status: Married    Spouse name: N/A  . Number of children: N/A  . Years of education: N/A   Social History Main Topics  . Smoking status: Light Tobacco Smoker    Years: 50.00    Types: Cigarettes  . Smokeless tobacco: Never Used     Comment: 0-2 cigs daily  . Alcohol use Yes     Comment: occasionally - 1 drink/6 months  . Drug use: No  . Sexual activity: Not Asked   Other Topics Concern   . None   Social History Narrative  . None   Additional Social History:    Allergies:   Allergies  Allergen Reactions  . Fluvirin [Flu Virus Vaccine] Diarrhea  . Penicillins Hives and Other (See Comments)    Has patient had a PCN reaction causing immediate rash, facial/tongue/throat swelling, SOB or lightheadedness with hypotension: No Has patient had a PCN reaction causing severe rash involving mucus membranes or skin necrosis: No Has patient had a PCN reaction that required hospitalization No Has patient had a PCN reaction occurring within the last 10 years: No If all of the above answers are "NO", then may proceed with Cephalosporin use.  . Tetanus Toxoids Diarrhea    Labs:  Results for orders placed or performed during the hospital encounter of 08/05/16 (from the past 48 hour(s))  Protime-INR     Status: None   Collection Time: 08/05/16 10:03 PM  Result Value Ref Range   Prothrombin Time 11.7  11.4 - 15.2 seconds   INR 0.86   APTT     Status: None   Collection Time: 08/05/16 10:03 PM  Result Value Ref Range   aPTT 30 24 - 36 seconds  CBC     Status: Abnormal   Collection Time: 08/05/16 10:03 PM  Result Value Ref Range   WBC 6.0 3.6 - 11.0 K/uL   RBC 5.01 3.80 - 5.20 MIL/uL   Hemoglobin 13.1 12.0 - 16.0 g/dL   HCT 40.9 35.0 - 47.0 %   MCV 81.6 80.0 - 100.0 fL   MCH 26.1 26.0 - 34.0 pg   MCHC 32.0 32.0 - 36.0 g/dL   RDW 15.9 (H) 11.5 - 14.5 %   Platelets 76 (L) 150 - 440 K/uL  Differential     Status: Abnormal   Collection Time: 08/05/16 10:03 PM  Result Value Ref Range   Neutrophils Relative % 84 %   Neutro Abs 5.1 1.4 - 6.5 K/uL   Lymphocytes Relative 11 %   Lymphs Abs 0.6 (L) 1.0 - 3.6 K/uL   Monocytes Relative 5 %   Monocytes Absolute 0.3 0.2 - 0.9 K/uL   Eosinophils Relative 0 %   Eosinophils Absolute 0.0 0 - 0.7 K/uL   Basophils Relative 0 %   Basophils Absolute 0.0 0 - 0.1 K/uL  Comprehensive metabolic panel     Status: Abnormal   Collection Time:  08/05/16 10:03 PM  Result Value Ref Range   Sodium 137 135 - 145 mmol/L   Potassium 4.1 3.5 - 5.1 mmol/L   Chloride 92 (L) 101 - 111 mmol/L   CO2 37 (H) 22 - 32 mmol/L   Glucose, Bld 132 (H) 65 - 99 mg/dL   BUN 10 6 - 20 mg/dL   Creatinine, Ser 0.38 (L) 0.44 - 1.00 mg/dL   Calcium 9.3 8.9 - 10.3 mg/dL   Total Protein 7.7 6.5 - 8.1 g/dL   Albumin 4.5 3.5 - 5.0 g/dL   AST 20 15 - 41 U/L   ALT 14 14 - 54 U/L   Alkaline Phosphatase 64 38 - 126 U/L   Total Bilirubin 0.7 0.3 - 1.2 mg/dL   GFR calc non Af Amer >60 >60 mL/min   GFR calc Af Amer >60 >60 mL/min    Comment: (NOTE) The eGFR has been calculated using the CKD EPI equation. This calculation has not been validated in all clinical situations. eGFR's persistently <60 mL/min signify possible Chronic Kidney Disease.    Anion gap 8 5 - 15  Troponin I     Status: None   Collection Time: 08/05/16 10:03 PM  Result Value Ref Range   Troponin I <0.03 <0.03 ng/mL  Ethanol     Status: None   Collection Time: 08/05/16 10:03 PM  Result Value Ref Range   Alcohol, Ethyl (B) <5 <5 mg/dL    Comment:        LOWEST DETECTABLE LIMIT FOR SERUM ALCOHOL IS 5 mg/dL FOR MEDICAL PURPOSES ONLY   Urinalysis, Routine w reflex microscopic     Status: Abnormal   Collection Time: 08/05/16 10:03 PM  Result Value Ref Range   Color, Urine STRAW (A) YELLOW   APPearance CLEAR (A) CLEAR   Specific Gravity, Urine 1.011 1.005 - 1.030   pH 8.0 5.0 - 8.0   Glucose, UA NEGATIVE NEGATIVE mg/dL   Hgb urine dipstick SMALL (A) NEGATIVE   Bilirubin Urine NEGATIVE NEGATIVE   Ketones, ur 20 (A) NEGATIVE mg/dL   Protein,  ur 30 (A) NEGATIVE mg/dL   Nitrite NEGATIVE NEGATIVE   Leukocytes, UA NEGATIVE NEGATIVE   RBC / HPF 0-5 0 - 5 RBC/hpf   WBC, UA 0-5 0 - 5 WBC/hpf   Bacteria, UA NONE SEEN NONE SEEN   Squamous Epithelial / LPF NONE SEEN NONE SEEN  Urine Drug Screen, Qualitative ([facility] only)     Status: Abnormal   Collection Time: 08/05/16 10:03 PM  Result Value  Ref Range   Tricyclic, Ur Screen NONE DETECTED NONE DETECTED   Amphetamines, Ur Screen NONE DETECTED NONE DETECTED   MDMA (Ecstasy)Ur Screen NONE DETECTED NONE DETECTED   Cocaine Metabolite,Ur [place] NONE DETECTED NONE DETECTED   Opiate, Ur Screen NONE DETECTED NONE DETECTED   Phencyclidine (PCP) Ur S NONE DETECTED NONE DETECTED   Cannabinoid 50 Ng, Ur [place] NONE DETECTED NONE DETECTED   Barbiturates, Ur Screen NONE DETECTED NONE DETECTED   Benzodiazepine, Ur Scrn POSITIVE (A) NONE DETECTED   Methadone Scn, Ur NONE DETECTED NONE DETECTED    Comment: (NOTE) 621  Tricyclics, urine               Cutoff 1000 ng/mL 200  Amphetamines, urine             Cutoff 1000 ng/mL 300  MDMA (Ecstasy), urine           Cutoff 500 ng/mL 400  Cocaine Metabolite, urine       Cutoff 300 ng/mL 500  Opiate, urine                   Cutoff 300 ng/mL 600  Phencyclidine (PCP), urine      Cutoff 25 ng/mL 700  Cannabinoid, urine              Cutoff 50 ng/mL 800  Barbiturates, urine             Cutoff 200 ng/mL 900  Benzodiazepine, urine           Cutoff 200 ng/mL 1000 Methadone, urine                Cutoff 300 ng/mL 1100 1200 The urine drug screen provides only a preliminary, unconfirmed 1300 analytical test result and should not be used for non-medical 1400 purposes. Clinical consideration and professional judgment should 1500 be applied to any positive drug screen result due to possible 1600 interfering substances. A more specific alternate chemical method 1700 must be used in order to obtain a confirmed analytical result.  1800 Gas chromato graphy / mass spectrometry (GC/MS) is the preferred 1900 confirmatory method.   Blood gas, arterial     Status: Abnormal (Preliminary result)   Collection Time: 08/06/16  1:51 AM  Result Value Ref Range   pH, Arterial 7.41 7.350 - 7.450   pCO2 arterial 62 (H) 32.0 - 48.0 mmHg   pO2, Arterial 109 (H) 83.0 - 108.0 mmHg   Bicarbonate 39.3 (H) 20.0 - 28.0 mmol/L   Acid-Base  Excess 12.1 (H) 0.0 - 2.0 mmol/L   O2 Saturation 98.3 %   Patient temperature 37.0    Oxygen index PENDING    Collection site RIGHT RADIAL    Sample type ARTERIAL DRAW    Allens test (pass/fail) PASS PASS  Troponin I     Status: None   Collection Time: 08/06/16  3:03 AM  Result Value Ref Range   Troponin I <0.03 <0.03 ng/mL  Troponin I     Status: None   Collection Time: 08/06/16  9:40  AM  Result Value Ref Range   Troponin I <0.03 <0.03 ng/mL  Hemoglobin A1c     Status: None   Collection Time: 08/06/16  9:40 AM  Result Value Ref Range   Hgb A1c MFr Bld 5.3 4.8 - 5.6 %    Comment: (NOTE)         Pre-diabetes: 5.7 - 6.4         Diabetes: >6.4         Glycemic control for adults with diabetes: <7.0    Mean Plasma Glucose 105 mg/dL    Comment: (NOTE) Performed At: [facility] [place], [place] [id number] Lindon Romp MD [id number]   Lipid panel     Status: None   Collection Time: 08/06/16  9:40 AM  Result Value Ref Range   Cholesterol 185 0 - 200 mg/dL   Triglycerides 89 <150 mg/dL   HDL 75 >40 mg/dL   Total CHOL/HDL Ratio 2.5 RATIO   VLDL 18 0 - 40 mg/dL   LDL Cholesterol 92 0 - 99 mg/dL    Comment:        Total Cholesterol/HDL:CHD Risk Coronary Heart Disease Risk Table                     Men   Women  1/2 Average Risk   3.4   3.3  Average Risk       5.0   4.4  2 X Average Risk   9.6   7.1  3 X Average Risk  23.4   11.0        Use the calculated Patient Ratio above and the CHD Risk Table to determine the patient's CHD Risk.        ATP III CLASSIFICATION (LDL):  <100     mg/dL   Optimal  100-129  mg/dL   Near or Above                    Optimal  130-159  mg/dL   Borderline  160-189  mg/dL   High  >190     mg/dL   Very High   Troponin I     Status: None   Collection Time: 08/06/16  2:38 PM  Result Value Ref Range   Troponin I <0.03 <0.03 ng/mL  Glucose, capillary     Status: None   Collection Time: 08/07/16 12:07 AM  Result  Value Ref Range   Glucose-Capillary 97 65 - 99 mg/dL  Glucose, capillary     Status: None   Collection Time: 08/07/16 12:09 AM  Result Value Ref Range   Glucose-Capillary 94 65 - 99 mg/dL  Creatinine, serum     Status: Abnormal   Collection Time: 08/07/16  9:22 AM  Result Value Ref Range   Creatinine, Ser 0.41 (L) 0.44 - 1.00 mg/dL   GFR calc non Af Amer >60 >60 mL/min   GFR calc Af Amer >60 >60 mL/min    Comment: (NOTE) The eGFR has been calculated using the CKD EPI equation. This calculation has not been validated in all clinical situations. eGFR's persistently <60 mL/min signify possible Chronic Kidney Disease.     Current Facility-Administered Medications  Medication Dose Route Frequency Provider Last Rate Last Dose  . 0.9 %  sodium chloride infusion   Intravenous Continuous Epifanio Lesches, MD 75 mL/hr at 08/07/16 0107    . acetaminophen (TYLENOL) tablet 650 mg  650 mg Oral Q4H PRN [employer], DO  650 mg at 08/06/16 2019   Or  . acetaminophen (TYLENOL) solution 650 mg  650 mg Per Tube Q4H PRN Alexis Hugelmeyer, DO       Or  . acetaminophen (TYLENOL) suppository 650 mg  650 mg Rectal Q4H PRN Alexis Hugelmeyer, DO      . acyclovir (ZOVIRAX) 525 mg in dextrose 5 % 100 mL IVPB  10 mg/kg (Ideal) Intravenous Q8H Epifanio Lesches, MD   525 mg at 08/07/16 0557  . albuterol (PROVENTIL) (2.5 MG/3ML) 0.083% nebulizer solution 2.5 mg  2.5 mg Nebulization Q6H PRN Alexis Hugelmeyer, DO      . ALPRAZolam Duanne Moron) tablet 0.5 mg  0.5 mg Oral BID Gonzella Lex, MD   0.5 mg at 08/07/16 1137  . aspirin EC tablet 81 mg  81 mg Oral Daily Alexis Hugelmeyer, DO   81 mg at 08/07/16 1137  . cefTRIAXone (ROCEPHIN) 2 g in dextrose 5 % 50 mL IVPB  2 g Intravenous Q12H Epifanio Lesches, MD   2 g at 08/07/16 0328  . clopidogrel (PLAVIX) tablet 75 mg  75 mg Oral Daily Alexis Hugelmeyer, DO   75 mg at 08/07/16 1137  . enoxaparin (LOVENOX) injection 40 mg  40 mg Subcutaneous Q24H Alexis  Hugelmeyer, DO   40 mg at 08/06/16 0400  . mometasone-formoterol (DULERA) 100-5 MCG/ACT inhaler 2 puff  2 puff Inhalation BID Alexis Hugelmeyer, DO   2 puff at 08/07/16 1137  . OLANZapine zydis (ZYPREXA) disintegrating tablet 5 mg  5 mg Oral QHS Gonzella Lex, MD   5 mg at 08/07/16 0107  . roflumilast (DALIRESP) tablet 500 mcg  500 mcg Oral Daily Alexis Hugelmeyer, DO   500 mcg at 08/07/16 1137  . rosuvastatin (CRESTOR) tablet 20 mg  20 mg Oral q1800 Epifanio Lesches, MD      . senna-docusate (Senokot-S) tablet 1 tablet  1 tablet Oral QHS PRN Alexis Hugelmeyer, DO      . vancomycin (VANCOCIN) IVPB 750 mg/150 ml premix  750 mg Intravenous Q12H Epifanio Lesches, MD   750 mg at 08/07/16 0329    Musculoskeletal: Strength & Muscle Tone: within normal limits and Patient appeared to be symmetric in her muscle tone and activity. Used both of her hands and legs symmetrically with equal strength. Gait & Station: She did not get up out of bed but appeared as I mentioned to have equal strength. Patient leans: N/A  Psychiatric Specialty Exam: Physical Exam  Nursing note and vitals reviewed. Constitutional: She appears well-developed and well-nourished.  HENT:  Head: Normocephalic and atraumatic.  Eyes: Conjunctivae are normal. Pupils are equal, round, and reactive to light.  Neck: Normal range of motion.  Cardiovascular: Regular rhythm and normal heart sounds.   Respiratory: Effort normal.  GI: Soft.  Musculoskeletal: Normal range of motion.  Neurological: She is alert.  Skin: Skin is warm and dry.  Psychiatric: Her mood appears anxious. Her affect is not blunt. Her speech is delayed. She is slowed. Cognition and memory are not impaired. She does not express inappropriate judgment. She exhibits a depressed mood. She expresses no suicidal ideation. She expresses no suicidal plans. She is communicative. She exhibits normal recent memory and normal remote memory.    Review of Systems   Constitutional: Negative.   HENT: Negative.   Eyes: Negative.   Respiratory: Negative.   Cardiovascular: Negative.   Gastrointestinal: Negative.   Musculoskeletal: Negative.   Skin: Negative.   Neurological: Negative.   Psychiatric/Behavioral: Positive for depression and memory loss.  Negative for hallucinations, substance abuse and suicidal ideas. The patient is nervous/anxious and has insomnia.     Blood pressure (!) 168/87, pulse (!) 104, temperature 98.7 F (37.1 C), temperature source Oral, resp. rate 18, height _0  (1.575 m), weight 62.5 kg (137 lb 11.2 oz), SpO2 99 %.Body mass index is 25.19 kg/m.  General Appearance: Casual  Eye Contact:  Fair  Speech:  Blocked and As noted above she has odd speech. Hard to tell if this is a true a facial or not. She was able to name some simple objects but not others. Able to answer some simple questions but not others. Able to produce some coherent sentences but at other times would appear to be blocked. At times I thought there was an emotional pattern to it but I could not be certain.  Volume:  Decreased  Mood:  Anxious  Affect:  Constricted  Thought Process:  Disorganized  Orientation:  Other:  Patient appeared to interact as though she understood the circumstances appropriately but could not tell me the name of the place where she was. She was able to tell me her name easily as well as the name of her husband.  Thought Content:  See previous notes  Suicidal Thoughts:  Yes.  without intent/plan  Homicidal Thoughts:  No  Memory:  Negative  Judgement:  Fair  Insight:  Shallow  Psychomotor Activity:  Restlessness  Concentration:  Concentration: Poor  Recall:  Poor  Fund of Knowledge:  Fair  Language:  Poor  Akathisia:  No  Handed:  Right  AIMS (if indicated):     Assets:  Desire for Improvement Financial Resources/Insurance Housing Social Support  ADL's:  Impaired  Cognition:  Impaired,  Mild  Sleep:        Treatment Plan  Summary: Daily contact with patient to assess and evaluate symptoms and progress in treatment, Medication management and Plan Change in status today suggest to me that a large fraction of her symptoms were psychogenic and related to her chronic anxiety and depression. Does not at this point appear that she would need inpatient treatment. I think the added olanzapine was probably of some benefit as well as the therapeutic benefit of simply being hospitalized and being paid attention to. I will continue to follow up all she is in the hospital but I am not going to make any other medicine changes right now. Patient was able to discuss and acknowledge all of this. She really needs to be getting outpatient therapy and I will see of social work can assist her with some information about that.  Disposition: Supportive therapy provided about ongoing stressors.  Alethia Berthold, MD 08/07/2016 4:37 PM

## 2016-08-07 NOTE — Progress Notes (Signed)
[facility] - [place] at [facility]   PATIENT NAME: Shannon Wilkerson    MR#:  [id number]  DATE OF BIRTH:  [birth year]  Alert, awake, oriented to day. Denies any abdominal pain. 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 No nausea. On IV antibiotics for possible encephalitis. To exclude possible stroke so we will get MRI of the brain.   CHIEF COMPLAINT:   Chief Complaint  Patient presents with  . Hypertension  . Altered Mental Status    REVIEW OF SYSTEMS:    Review of Systems  Constitutional: Positive for fever. Negative for chills.  HENT: Negative for hearing loss.   Eyes: Negative for blurred vision, double vision and photophobia.  Respiratory: Negative for cough, hemoptysis and shortness of breath.   Cardiovascular: Negative for palpitations, orthopnea and leg swelling.  Gastrointestinal: Negative for abdominal pain, diarrhea and vomiting.  Genitourinary: Negative for dysuria and urgency.  Musculoskeletal: Negative for myalgias and neck pain.  Skin: Negative for rash.  Neurological: Negative for dizziness, focal weakness, seizures, weakness and headaches.  Psychiatric/Behavioral: Negative for memory loss. The patient does not have insomnia.     Nutrition:  Tolerating Diet: Tolerating PT:      DRUG ALLERGIES:   Allergies  Allergen Reactions  . Fluvirin [Flu Virus Vaccine] Diarrhea  . Penicillins Hives and Other (See Comments)    Has patient had a PCN reaction causing immediate rash, facial/tongue/throat swelling, SOB or lightheadedness with hypotension: No Has patient had a PCN reaction causing severe rash involving mucus membranes or skin necrosis: No Has patient had a PCN reaction that required hospitalization No Has patient had a PCN reaction occurring within the last 10 years: No If all of the above answers are "NO", then may proceed with Cephalosporin use.  . Tetanus Toxoids Diarrhea    VITALS:  Blood pressure  (!) 159/76, pulse (!) 109, temperature 100.2 F (37.9 C), temperature source Oral, resp. rate 18, height  (1.575 m), weight 62.5 kg (137 lb 11.2 oz), SpO2 93 %.  PHYSICAL EXAMINATION:   Physical Exam  GENERAL:  71 y.o.-year-old patient lying in the  Bed,confused.  EYES: Pupils equal, round, reactive to light and accommodation. No scleral icterus. Extraocular muscles intact.  HEENT: Head atraumatic, normocephalic. Oropharynx and nasopharynx clear.  NECK:  Supple, no jugular venous distention. No thyroid enlargement, no tenderness.  LUNGS: Normal breath sounds bilaterally, no wheezing, rales,rhonchi or crepitation. No use of accessory muscles of respiration.  CARDIOVASCULAR: S1, S2 normal. No murmurs, rubs, or gallops.  ABDOMEN: Soft, nontender, nondistended. Bowel sounds present. No organomegaly or mass.  EXTREMITIES: No pedal edema, cyanosis, or clubbing.  NEUROLOGIC: , Awake, oriented, cranial nerves II through XII intact, power 5 /5 upper and lower extremities. Following directions clearly. And able to talk full sentences today.  PSYCHIATRIC: , Awake, oriented SKIN: No obvious rash, lesion, or ulcer.    LABORATORY PANEL:   CBC  Recent Labs Lab 08/05/16 2203  WBC 6.0  HGB 13.1  HCT 40.9  PLT 76*   ------------------------------------------------------------------------------------------------------------------  Chemistries   Recent Labs Lab 08/05/16 2203 08/07/16 0922  NA 137  --   K 4.1  --   CL 92*  --   CO2 37*  --   GLUCOSE 132*  --   BUN 10  --   CREATININE 0.38* 0.41*  CALCIUM 9.3  --   AST 20  --   ALT 14  --   ALKPHOS 64  --  BILITOT 0.7  --    ------------------------------------------------------------------------------------------------------------------  Cardiac Enzymes  Recent Labs Lab 08/06/16 1438  TROPONINI <0.03    ------------------------------------------------------------------------------------------------------------------  RADIOLOGY:  Ct Angio Head W Or Wo Contrast  Result Date: 08/05/2016 CLINICAL DATA:  Sudden lungs a headache and altered mental status. Expressive aphasia. EXAM: CT ANGIOGRAPHY HEAD AND NECK TECHNIQUE: Multidetector CT imaging of the head and neck was performed using the standard protocol during bolus administration of intravenous contrast. Multiplanar CT image reconstructions and MIPs were obtained to evaluate the vascular anatomy. Carotid stenosis measurements (when applicable) are obtained utilizing NASCET criteria, using the distal internal carotid diameter as the denominator. CONTRAST:  75 mL Isovue 370 COMPARISON:  Head CT 08/05/2016 FINDINGS: CTA NECK FINDINGS Aortic arch: There is no aneurysm or dissection of the visualized ascending aorta or aortic arch. There is a normal 3 vessel branching pattern. There is atherosclerotic calcification within the proximal subclavian arteries without stenosis. There is calcific atherosclerosis of the aortic arch. Right carotid system: The right common carotid origin is widely patent. There is no common carotid or internal carotid artery dissection or aneurysm. Mild atherosclerosis of the carotid bifurcation with irregular linear plaque in the proximal internal carotid artery. No hemodynamically significant stenosis. Left carotid system: The left common carotid origin is widely patent. There is no common carotid or internal carotid artery dissection or aneurysm. Mild atherosclerosis of the carotid bifurcation without hemodynamically significant stenosis. Vertebral arteries: The vertebral system is codominant. Both vertebral artery origins are widely patent. Both vertebral arteries are normal to their confluence with the basilar artery. Skeleton: There is no bony spinal canal stenosis. No lytic or blastic lesions. Other neck: The nasopharynx is clear.  The oropharynx and hypopharynx are normal. The epiglottis is normal. The supraglottic larynx, glottis and subglottic larynx are normal. No retropharyngeal collection. The parapharyngeal spaces are preserved. The parotid and submandibular glands are normal. No sialolithiasis or salivary ductal dilatation. The thyroid gland is normal. There is no cervical lymphadenopathy. Upper chest: Bilateral emphysema. No focal consolidation or nodule/mass. Review of the MIP images confirms the above findings CTA HEAD FINDINGS Anterior circulation: --Intracranial internal carotid arteries: Mild atherosclerotic calcification without stenosis. --Anterior cerebral arteries: Normal. --Middle cerebral arteries: Normal. --Posterior communicating arteries: Absent bilaterally. Posterior circulation: --Posterior cerebral arteries: Normal. --Superior cerebellar arteries: Normal. --Basilar artery: Normal. --Anterior inferior cerebellar arteries: Normal. --Posterior inferior cerebellar arteries: Normal. Venous sinuses: As permitted by contrast timing, patent. Anatomic variants: None Delayed phase: Not performed. Review of the MIP images confirms the above findings IMPRESSION: 1. Normal intracranial CTA. 2. Bilateral mild internal carotid artery atherosclerosis without hemodynamically significant stenosis. 3. Aortic atherosclerosis. Electronically Signed   By: Deatra Robinson M.D.   On: 08/05/2016 23:40   Dg Chest 1 View  Result Date: 08/06/2016 CLINICAL DATA:  Speech difficulties. Confusion. History of asthma and COPD. EXAM: CHEST 1 VIEW COMPARISON:  03/31/2013. FINDINGS: Mediastinum and hilar structures are normal. Lungs are clear. No pleural effusion or pneumothorax. Heart size normal. Degenerative changes thoracic spine. IMPRESSION: No active disease. Electronically Signed   By: Maisie Fus  Register   On: 08/06/2016 14:53   Ct Head Wo Contrast  Result Date: 08/06/2016 CLINICAL DATA:  Altered mental status.  Confusion. EXAM: CT HEAD  WITHOUT CONTRAST TECHNIQUE: Contiguous axial images were obtained from the base of the skull through the vertex without intravenous contrast. COMPARISON:  Head CT scan 08/05/2016. 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 Chronic microvascular ischemic change is noted.  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 No evidence of acute abnormality including hemorrhage, infarct, mass lesion, mass effect, midline shift or abnormal extra-axial fluid collection. No hydrocephalus or pneumocephalus. Vascular: Atherosclerosis noted. Skull: Intact. Sinuses/Orbits: Status post left lens extraction. Other: None. IMPRESSION: No acute abnormality. 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 Atherosclerosis. Electronically Signed   By: Drusilla Kanner M.D.   On: 08/06/2016 15:17   Ct Head Wo Contrast  Result Date: 08/05/2016 CLINICAL DATA:  Code stroke.  Confusion. EXAM: CT HEAD WITHOUT CONTRAST TECHNIQUE: Contiguous axial images were obtained from the base of the skull through the vertex without intravenous contrast. COMPARISON:  Head CT 02/28/2015 FINDINGS: Brain: There is focal hypoattenuation within the medial right occipital lobe new from the prior study. No intracranial hemorrhage. No midline shift or other mass effect. No hydrocephalus. Brain parenchyma and CSF-containing spaces are otherwise normal for age. Vascular: No hyperdense vessel or unexpected calcification. Skull: Normal visualized skull base, calvarium and extracranial soft tissues. Sinuses/Orbits: No sinus fluid levels or advanced mucosal thickening. No mastoid effusion. Normal orbits. ASPECTS [facility] Stroke Program Early CT Score) - Ganglionic level infarction (caudate, lentiform nuclei, internal capsule, insula, M1-M3 cortex): 7 - Supraganglionic infarction (M4-M6 cortex): 3 Total score (0-10 with 10 being normal): 10 IMPRESSION: 1. Age indeterminate infarct  of the right occipital lobe, in the PCA distribution, new compared to 02/28/2015. By reported history, the patient has had a previous ischemic event involving visual deficits. In this context, this finding is expected to be subacute to chronic; however, MRI would provide more definitive characterization. 2. No intracranial hemorrhage or mass effect. 3. ASPECTS is 10. These results were called by telephone at the time of interpretation on 08/05/2016 at 10:28 pm to Dr. Minna Antis , who verbally acknowledged these results. Electronically Signed   By: Deatra Robinson M.D.   On: 08/05/2016 22:28   Ct Angio Neck W And/or Wo Contrast  Result Date: 08/05/2016 CLINICAL DATA:  Sudden lungs a headache and altered mental status. Expressive aphasia. EXAM: CT ANGIOGRAPHY HEAD AND NECK TECHNIQUE: Multidetector CT imaging of the head and neck was performed using the standard protocol during bolus administration of intravenous contrast. Multiplanar CT image reconstructions and MIPs were obtained to evaluate the vascular anatomy. Carotid stenosis measurements (when applicable) are obtained utilizing NASCET criteria, using the distal internal carotid diameter as the denominator. CONTRAST:  75 mL Isovue 370 COMPARISON:  Head CT 08/05/2016 FINDINGS: CTA NECK FINDINGS Aortic arch: There is no aneurysm or dissection of the visualized ascending aorta or aortic arch. There is a normal 3 vessel branching pattern. There is atherosclerotic calcification within the proximal subclavian arteries without stenosis. There is calcific atherosclerosis of the aortic arch. Right carotid system: The right common carotid origin is widely patent. There is no common carotid or internal carotid artery dissection or aneurysm. Mild atherosclerosis of the carotid bifurcation with irregular linear plaque in the proximal internal carotid artery. No hemodynamically significant stenosis. Left carotid system: The left common carotid origin is widely patent.  There is no common carotid or internal carotid artery dissection or aneurysm. Mild atherosclerosis of the carotid bifurcation without hemodynamically significant stenosis. Vertebral arteries: The vertebral system is codominant. Both vertebral artery origins are widely patent. Both vertebral arteries are normal to their confluence with the basilar artery. Skeleton: There is no bony spinal canal stenosis. No lytic or blastic lesions. Other neck: The nasopharynx is clear. The oropharynx and hypopharynx are normal. The epiglottis  is normal. The supraglottic larynx, glottis and subglottic larynx are normal. No retropharyngeal collection. The parapharyngeal spaces are preserved. The parotid and submandibular glands are normal. No sialolithiasis or salivary ductal dilatation. The thyroid gland is normal. There is no cervical lymphadenopathy. Upper chest: Bilateral emphysema. No focal consolidation or nodule/mass. Review of the MIP images confirms the above findings CTA HEAD FINDINGS Anterior circulation: --Intracranial internal carotid arteries: Mild atherosclerotic calcification without stenosis. --Anterior cerebral arteries: Normal. --Middle cerebral arteries: Normal. --Posterior communicating arteries: Absent bilaterally. Posterior circulation: --Posterior cerebral arteries: Normal. --Superior cerebellar arteries: Normal. --Basilar artery: Normal. --Anterior inferior cerebellar arteries: Normal. --Posterior inferior cerebellar arteries: Normal. Venous sinuses: As permitted by contrast timing, patent. Anatomic variants: None Delayed phase: Not performed. Review of the MIP images confirms the above findings IMPRESSION: 1. Normal intracranial CTA. 2. Bilateral mild internal carotid artery atherosclerosis without hemodynamically significant stenosis. 3. Aortic atherosclerosis. Electronically Signed   By: Deatra Robinson M.D.   On: 08/05/2016 23:40     ASSESSMENT AND PLAN:   Principal Problem:   Acute delirium Active  Problems:   COPD (chronic obstructive pulmonary disease) (HCC)   Hypertension   Major depression   Generalized anxiety disorder   CVA (cerebral vascular accident) (HCC)  1.Altered mental status likley due to possible stroke,but  Could not lie down still so she could not have MRI>CT head repeated  yesterday afternoon because of worsening mental status and its negative for acute bleed/stroke. For now treating her with IV abx and antivirals for possible encephalitis. she can have MRI with sedation.'spoke with neurologist  DR.Reynolds  Alert awake and oriented today; on neurological exam also within normal limits. Check MRI of the brain. Continue antibiotics. Chest x-ray didn't show any pneumonia. Acute delirium seems to be improving. #2. history of depression; patient is on Xanax, lorazepam. Seen by psychiatry. #3 history of COPD: No wheezing no continue home inhalers. Deconditioning: Physical therapy consult.   All the records are reviewed and case discussed with Care Management/Social Workerr. Management plans discussed with the patient, family and they are in agreement.  CODE STATUS: DNR  TOTAL TIME TAKING CARE OF THIS PATIENT: 35 minutes.   POSSIBLE D/C IN 3-4DAYS, DEPENDING ON CLINICAL CONDITION.   Katha Hamming M.D on 08/07/2016 at 12:07 PM  Between 7am to 6pm - Pager - [phone]  After 6pm go to [email or web address] - password [email or web address] [facility]  [facility] [place] [facility]  Office  [phone]  CC: Primary care physician; Alan Mulder, MD

## 2016-08-07 NOTE — Progress Notes (Addendum)
Subjective: Patient improved today.  Remains sitting up and this is pre-existing for her.  She reports that since her surgery this is the most comfortable position.  She will not tolerate a MRI.  Also reports that she will not give consent for LP.    Objective: Current vital signs: BP (!) 159/76 (BP Location: Left Arm)   Pulse (!) 109   Temp 100.2 F (37.9 C) (Oral)   Resp 18   Ht  (1.575 m)   Wt 62.5 kg (137 lb 11.2 oz)   SpO2 93%   BMI 25.19 kg/m  Vital signs in last 24 hours: Temp:  [99.7 F (37.6 C)-100.9 F (38.3 C)] 100.2 F (37.9 C) (04/17 0355) Pulse Rate:  [81-110] 109 (04/17 0355) Resp:  [18-19] 18 (04/17 0355) BP: (129-190)/(53-79) 159/76 (04/17 0355) SpO2:  [88 %-96 %] 93 % (04/17 0355) Weight:  [62.5 kg (137 lb 11.2 oz)] 62.5 kg (137 lb 11.2 oz) (04/16 1830)  Intake/Output from previous day: 04/16 0701 - 04/17 0700 In: 1944.3 [I.V.:1583.8; IV Piggyback:360.5] Out: -  Intake/Output this shift: No intake/output data recorded. Nutritional status: Diet full liquid Room service appropriate? Yes with Assist; Fluid consistency: Thin  Neurologic Exam: Mental Status: Alert, oriented, thought content appropriate.  Speech fluent without evidence of aphasia.  Able to follow 3 step commands without difficulty. Labile and depressed. Cranial Nerves: II: Discs flat bilaterally; Visual fields grossly normal, pupils equal, round, reactive to light and accommodation III,IV, VI: ptosis not present, extra-ocular motions intact bilaterally V,VII: mild decrease in the right NLF, facial light touch sensation normal bilaterally VIII: hearing normal bilaterally IX,X: gag reflex present XI: bilateral shoulder shrug XII: midline tongue extension Motor: Right : Upper extremity   5/5    Left:     Upper extremity   5/5  Lower extremity   5/5     Lower extremity   5/5   Lab Results: Basic Metabolic Panel:  Recent Labs Lab 08/05/16 2203 08/07/16 0922  NA 137  --   K 4.1  --    CL 92*  --   CO2 37*  --   GLUCOSE 132*  --   BUN 10  --   CREATININE 0.38* 0.41*  CALCIUM 9.3  --     Liver Function Tests:  Recent Labs Lab 08/05/16 2203  AST 20  ALT 14  ALKPHOS 64  BILITOT 0.7  PROT 7.7  ALBUMIN 4.5   No results for input(s): LIPASE, AMYLASE in the last 168 hours. No results for input(s): AMMONIA in the last 168 hours.  CBC:  Recent Labs Lab 08/05/16 2203  WBC 6.0  NEUTROABS 5.1  HGB 13.1  HCT 40.9  MCV 81.6  PLT 76*    Cardiac Enzymes:  Recent Labs Lab 08/05/16 2203 08/06/16 0303 08/06/16 0940 08/06/16 1438  TROPONINI <0.03 <0.03 <0.03 <0.03    Lipid Panel:  Recent Labs Lab 08/06/16 0940  CHOL 185  TRIG 89  HDL 75  CHOLHDL 2.5  VLDL 18  LDLCALC 92    CBG:  Recent Labs Lab 08/07/16 0007 08/07/16 0009  GLUCAP 97 94    Microbiology: Results for orders placed or performed during the hospital encounter of 11/30/14  Culture, routine-abscess     Status: None   Collection Time: 11/30/14  4:06 PM  Result Value Ref Range Status   Specimen Description FLUID  Final   Special Requests Normal  Final   Report Status 01/18/2015 FINAL  Final  Body fluid culture  Status: None   Collection Time: 11/30/14  4:06 PM  Result Value Ref Range Status   Specimen Description FLUID  Final   Special Requests Normal  Final   Gram Stain MODERATE WBC SEEN NO ORGANISMS SEEN   Final   Culture NO GROWTH 4 DAYS  Final   Report Status 12/04/2014 FINAL  Final    Coagulation Studies:  Recent Labs  08/05/16 2203  LABPROT 11.7  INR 0.86    Imaging: Ct Angio Head W Or Wo Contrast  Result Date: 08/05/2016 CLINICAL DATA:  Sudden lungs a headache and altered mental status. Expressive aphasia. EXAM: CT ANGIOGRAPHY HEAD AND NECK TECHNIQUE: Multidetector CT imaging of the head and neck was performed using the standard protocol during bolus administration of intravenous contrast. Multiplanar CT image reconstructions and MIPs were obtained  to evaluate the vascular anatomy. Carotid stenosis measurements (when applicable) are obtained utilizing NASCET criteria, using the distal internal carotid diameter as the denominator. CONTRAST:  75 mL Isovue 370 COMPARISON:  Head CT 08/05/2016 FINDINGS: CTA NECK FINDINGS Aortic arch: There is no aneurysm or dissection of the visualized ascending aorta or aortic arch. There is a normal 3 vessel branching pattern. There is atherosclerotic calcification within the proximal subclavian arteries without stenosis. There is calcific atherosclerosis of the aortic arch. Right carotid system: The right common carotid origin is widely patent. There is no common carotid or internal carotid artery dissection or aneurysm. Mild atherosclerosis of the carotid bifurcation with irregular linear plaque in the proximal internal carotid artery. No hemodynamically significant stenosis. Left carotid system: The left common carotid origin is widely patent. There is no common carotid or internal carotid artery dissection or aneurysm. Mild atherosclerosis of the carotid bifurcation without hemodynamically significant stenosis. Vertebral arteries: The vertebral system is codominant. Both vertebral artery origins are widely patent. Both vertebral arteries are normal to their confluence with the basilar artery. Skeleton: There is no bony spinal canal stenosis. No lytic or blastic lesions. Other neck: The nasopharynx is clear. The oropharynx and hypopharynx are normal. The epiglottis is normal. The supraglottic larynx, glottis and subglottic larynx are normal. No retropharyngeal collection. The parapharyngeal spaces are preserved. The parotid and submandibular glands are normal. No sialolithiasis or salivary ductal dilatation. The thyroid gland is normal. There is no cervical lymphadenopathy. Upper chest: Bilateral emphysema. No focal consolidation or nodule/mass. Review of the MIP images confirms the above findings CTA HEAD FINDINGS Anterior  circulation: --Intracranial internal carotid arteries: Mild atherosclerotic calcification without stenosis. --Anterior cerebral arteries: Normal. --Middle cerebral arteries: Normal. --Posterior communicating arteries: Absent bilaterally. Posterior circulation: --Posterior cerebral arteries: Normal. --Superior cerebellar arteries: Normal. --Basilar artery: Normal. --Anterior inferior cerebellar arteries: Normal. --Posterior inferior cerebellar arteries: Normal. Venous sinuses: As permitted by contrast timing, patent. Anatomic variants: None Delayed phase: Not performed. Review of the MIP images confirms the above findings IMPRESSION: 1. Normal intracranial CTA. 2. Bilateral mild internal carotid artery atherosclerosis without hemodynamically significant stenosis. 3. Aortic atherosclerosis. Electronically Signed   By: Deatra Robinson M.D.   On: 08/05/2016 23:40   Dg Chest 1 View  Result Date: 08/06/2016 CLINICAL DATA:  Speech difficulties. Confusion. History of asthma and COPD. EXAM: CHEST 1 VIEW COMPARISON:  03/31/2013. FINDINGS: Mediastinum and hilar structures are normal. Lungs are clear. No pleural effusion or pneumothorax. Heart size normal. Degenerative changes thoracic spine. IMPRESSION: No active disease. Electronically Signed   By: Maisie Fus  Register   On: 08/06/2016 14:53   Ct Head Wo Contrast  Result Date: 08/06/2016 CLINICAL DATA:  Altered mental status.  Confusion. EXAM: CT HEAD WITHOUT CONTRAST TECHNIQUE: Contiguous axial images were obtained from the base of the skull through the vertex without intravenous contrast. COMPARISON:  Head CT scan 08/05/2016. 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 Chronic microvascular ischemic change is noted. 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 No evidence of acute abnormality including hemorrhage, infarct, mass lesion, mass effect, midline shift or abnormal extra-axial fluid collection. No  hydrocephalus or pneumocephalus. Vascular: Atherosclerosis noted. Skull: Intact. Sinuses/Orbits: Status post left lens extraction. Other: None. IMPRESSION: No acute abnormality. 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 Atherosclerosis. Electronically Signed   By: Drusilla Kanner M.D.   On: 08/06/2016 15:17   Ct Head Wo Contrast  Result Date: 08/05/2016 CLINICAL DATA:  Code stroke.  Confusion. EXAM: CT HEAD WITHOUT CONTRAST TECHNIQUE: Contiguous axial images were obtained from the base of the skull through the vertex without intravenous contrast. COMPARISON:  Head CT 02/28/2015 FINDINGS: Brain: There is focal hypoattenuation within the medial right occipital lobe new from the prior study. No intracranial hemorrhage. No midline shift or other mass effect. No hydrocephalus. Brain parenchyma and CSF-containing spaces are otherwise normal for age. Vascular: No hyperdense vessel or unexpected calcification. Skull: Normal visualized skull base, calvarium and extracranial soft tissues. Sinuses/Orbits: No sinus fluid levels or advanced mucosal thickening. No mastoid effusion. Normal orbits. ASPECTS [facility] Stroke Program Early CT Score) - Ganglionic level infarction (caudate, lentiform nuclei, internal capsule, insula, M1-M3 cortex): 7 - Supraganglionic infarction (M4-M6 cortex): 3 Total score (0-10 with 10 being normal): 10 IMPRESSION: 1. 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 By reported history, the patient has had a previous ischemic event involving visual deficits. In this context, this finding is expected to be subacute to chronic; however, MRI would provide more definitive characterization. 2. No intracranial hemorrhage or mass effect. 3. ASPECTS is 10. These results were called by telephone at the time of interpretation on 08/05/2016 at 10:28 pm to Dr. Minna Antis , who verbally acknowledged these  results. Electronically Signed   By: Deatra Robinson M.D.   On: 08/05/2016 22:28   Ct Angio Neck W And/or Wo Contrast  Result Date: 08/05/2016 CLINICAL DATA:  Sudden lungs a headache and altered mental status. Expressive aphasia. EXAM: CT ANGIOGRAPHY HEAD AND NECK TECHNIQUE: Multidetector CT imaging of the head and neck was performed using the standard protocol during bolus administration of intravenous contrast. Multiplanar CT image reconstructions and MIPs were obtained to evaluate the vascular anatomy. Carotid stenosis measurements (when applicable) are obtained utilizing NASCET criteria, using the distal internal carotid diameter as the denominator. CONTRAST:  75 mL Isovue 370 COMPARISON:  Head CT 08/05/2016 FINDINGS: CTA NECK FINDINGS Aortic arch: There is no aneurysm or dissection of the visualized ascending aorta or aortic arch. There is a normal 3 vessel branching pattern. There is atherosclerotic calcification within the proximal subclavian arteries without stenosis. There is calcific atherosclerosis of the aortic arch. Right carotid system: The right common carotid origin is widely patent. There is no common carotid or internal carotid artery dissection or aneurysm. Mild atherosclerosis of the carotid bifurcation with irregular linear plaque in the proximal internal carotid artery. No hemodynamically significant stenosis. Left carotid system: The left common carotid origin is widely patent. There is no common carotid or internal carotid artery dissection or aneurysm. Mild atherosclerosis of the carotid bifurcation without hemodynamically significant  stenosis. Vertebral arteries: The vertebral system is codominant. Both vertebral artery origins are widely patent. Both vertebral arteries are normal to their confluence with the basilar artery. Skeleton: There is no bony spinal canal stenosis. No lytic or blastic lesions. Other neck: The nasopharynx is clear. The oropharynx and hypopharynx are normal. The  epiglottis is normal. The supraglottic larynx, glottis and subglottic larynx are normal. No retropharyngeal collection. The parapharyngeal spaces are preserved. The parotid and submandibular glands are normal. No sialolithiasis or salivary ductal dilatation. The thyroid gland is normal. There is no cervical lymphadenopathy. Upper chest: Bilateral emphysema. No focal consolidation or nodule/mass. Review of the MIP images confirms the above findings CTA HEAD FINDINGS Anterior circulation: --Intracranial internal carotid arteries: Mild atherosclerotic calcification without stenosis. --Anterior cerebral arteries: Normal. --Middle cerebral arteries: Normal. --Posterior communicating arteries: Absent bilaterally. Posterior circulation: --Posterior cerebral arteries: Normal. --Superior cerebellar arteries: Normal. --Basilar artery: Normal. --Anterior inferior cerebellar arteries: Normal. --Posterior inferior cerebellar arteries: Normal. Venous sinuses: As permitted by contrast timing, patent. Anatomic variants: None Delayed phase: Not performed. Review of the MIP images confirms the above findings IMPRESSION: 1. Normal intracranial CTA. 2. Bilateral mild internal carotid artery atherosclerosis without hemodynamically significant stenosis. 3. Aortic atherosclerosis. Electronically Signed   By: Deatra Robinson M.D.   On: 08/05/2016 23:40    Medications:  I have reviewed the patient's current medications. Scheduled: . ALPRAZolam  0.5 mg Oral BID  . aspirin EC  81 mg Oral Daily  . clopidogrel  75 mg Oral Daily  . enoxaparin (LOVENOX) injection  40 mg Subcutaneous Q24H  . fluticasone furoate-vilanterol  1 puff Inhalation Daily  . mometasone-formoterol  2 puff Inhalation BID  . OLANZapine zydis  5 mg Oral QHS  . roflumilast  500 mcg Oral Daily    Assessment/Plan: Patient was felt to be worsening yesterday and had a repeat head CT.  This has been reviewed and shows no acute changes.  Patient unable to have MRI to  determine whether an ischemic event present.  Was febrile yesterday with a Tmax of 100.9.  No source noted.  Unable to perform LP therefore started on broad spectrum antibiotics which would cover possibility of herpes encephalitis as well.  Patient improved today but reports she would just be better off dead.  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    Recommendations: 1.  Would continue broad spectrum coverage for 5 days then attempt discontinuation of antibiotics.     2.  Continue ASA and Plavix.   3.  Continue neuro checks   LOS: 0 days   Thana Farr, MD Neurology [phone] 08/07/2016  12:08 PM

## 2016-08-07 NOTE — Progress Notes (Signed)
Pt has been anxious  Today. Talked with she and spouse  Re  Anxiety meds.  02 cont 3l [place].  abx  Cont.

## 2016-08-07 NOTE — Care Management Obs Status (Signed)
MEDICARE OBSERVATION STATUS NOTIFICATION   Patient Details  Name: MADYSYN HANKEN MRN: [id number] Date of Birth: [birth year]   Medicare Observation Status Notification Given:  Yes    Gwenette Greet, RN 08/07/2016, 10:46 AM

## 2016-08-08 DIAGNOSIS — Z8249 Family history of ischemic heart disease and other diseases of the circulatory system: Secondary | ICD-10-CM | POA: Diagnosis not present

## 2016-08-08 DIAGNOSIS — F17210 Nicotine dependence, cigarettes, uncomplicated: Secondary | ICD-10-CM | POA: Diagnosis present

## 2016-08-08 DIAGNOSIS — Z9981 Dependence on supplemental oxygen: Secondary | ICD-10-CM | POA: Diagnosis not present

## 2016-08-08 DIAGNOSIS — R4182 Altered mental status, unspecified: Secondary | ICD-10-CM | POA: Diagnosis present

## 2016-08-08 DIAGNOSIS — Z8673 Personal history of transient ischemic attack (TIA), and cerebral infarction without residual deficits: Secondary | ICD-10-CM | POA: Diagnosis not present

## 2016-08-08 DIAGNOSIS — I639 Cerebral infarction, unspecified: Secondary | ICD-10-CM | POA: Diagnosis present

## 2016-08-08 DIAGNOSIS — L03116 Cellulitis of left lower limb: Secondary | ICD-10-CM | POA: Diagnosis present

## 2016-08-08 DIAGNOSIS — F329 Major depressive disorder, single episode, unspecified: Secondary | ICD-10-CM | POA: Diagnosis present

## 2016-08-08 DIAGNOSIS — Z79899 Other long term (current) drug therapy: Secondary | ICD-10-CM | POA: Diagnosis not present

## 2016-08-08 DIAGNOSIS — Z961 Presence of intraocular lens: Secondary | ICD-10-CM | POA: Diagnosis present

## 2016-08-08 DIAGNOSIS — Z887 Allergy status to serum and vaccine status: Secondary | ICD-10-CM | POA: Diagnosis not present

## 2016-08-08 DIAGNOSIS — Z66 Do not resuscitate: Secondary | ICD-10-CM | POA: Diagnosis present

## 2016-08-08 DIAGNOSIS — J449 Chronic obstructive pulmonary disease, unspecified: Secondary | ICD-10-CM | POA: Diagnosis present

## 2016-08-08 DIAGNOSIS — R4701 Aphasia: Secondary | ICD-10-CM | POA: Diagnosis present

## 2016-08-08 DIAGNOSIS — Z811 Family history of alcohol abuse and dependence: Secondary | ICD-10-CM | POA: Diagnosis not present

## 2016-08-08 DIAGNOSIS — Z9889 Other specified postprocedural states: Secondary | ICD-10-CM | POA: Diagnosis not present

## 2016-08-08 DIAGNOSIS — Z981 Arthrodesis status: Secondary | ICD-10-CM | POA: Diagnosis not present

## 2016-08-08 DIAGNOSIS — R410 Disorientation, unspecified: Secondary | ICD-10-CM

## 2016-08-08 DIAGNOSIS — F411 Generalized anxiety disorder: Secondary | ICD-10-CM | POA: Diagnosis present

## 2016-08-08 DIAGNOSIS — Z880 Allergy status to penicillin: Secondary | ICD-10-CM | POA: Diagnosis not present

## 2016-08-08 DIAGNOSIS — I10 Essential (primary) hypertension: Secondary | ICD-10-CM | POA: Diagnosis present

## 2016-08-08 DIAGNOSIS — Z823 Family history of stroke: Secondary | ICD-10-CM | POA: Diagnosis not present

## 2016-08-08 DIAGNOSIS — R471 Dysarthria and anarthria: Secondary | ICD-10-CM | POA: Diagnosis present

## 2016-08-08 DIAGNOSIS — G0490 Encephalitis and encephalomyelitis, unspecified: Secondary | ICD-10-CM | POA: Diagnosis present

## 2016-08-08 DIAGNOSIS — Z9842 Cataract extraction status, left eye: Secondary | ICD-10-CM | POA: Diagnosis not present

## 2016-08-08 LAB — BLOOD GAS, ARTERIAL
Acid-Base Excess: 12.1 mmol/L — ABNORMAL HIGH (ref 0.0–2.0)
Bicarbonate: 39.3 mmol/L — ABNORMAL HIGH (ref 20.0–28.0)
O2 SAT: 98.3 %
PCO2 ART: 62 mmHg — AB (ref 32.0–48.0)
PH ART: 7.41 (ref 7.350–7.450)
Patient temperature: 37
pO2, Arterial: 109 mmHg — ABNORMAL HIGH (ref 83.0–108.0)

## 2016-08-08 MED ORDER — TIOTROPIUM BROMIDE MONOHYDRATE 18 MCG IN CAPS
18.0000 ug | ORAL_CAPSULE | Freq: Every day | RESPIRATORY_TRACT | Status: DC
  Administered 2016-08-08 – 2016-08-10 (×3): 18 ug via RESPIRATORY_TRACT
  Filled 2016-08-08: qty 5

## 2016-08-08 NOTE — Consult Note (Signed)
[facility] Consult   Reason for Consult:  Consult for 71 year old woman with a history of depression and strokes currently in the hospital with altered mental status Referring Physician:  Vianne Bulls Patient Identification: ANICKA STUCKERT MRN:  [id number] Principal Diagnosis: Acute delirium Diagnosis:   Patient Active Problem List   Diagnosis Date Noted  . CVA (cerebral vascular accident) ([place]) [I63.9] 08/06/2016  . Acute delirium [R41.0] 08/06/2016  . Major depression [F32.9] 03/01/2015  . Generalized anxiety disorder [F41.1] 03/01/2015  . CVA (cerebral infarction) [I63.9] 10/27/2014  . COPD (chronic obstructive pulmonary disease) ([place]) [J44.9] 10/27/2014  . Chronic respiratory failure ([place]) [J96.10] 10/27/2014  . Anxiety [F41.9] 10/27/2014  . Hypertension [I10] 10/27/2014    Total Time spent with patient: 30 minutes  Subjective:   NORRINE BALLESTER is a 71 y.o. female patient admitted with "it is, you know, that thing".  Follow-up for Tuesday the 82th. 71 year old woman who presented with altered mental status possible stroke. On reevaluation today her mental status is dramatically different from what we saw yesterday. The "akathisia" is almost entirely resolved. She is able to give a history and answer questions appropriately. She has some memory gaps and has very little memory of how she was yesterday. She is able to describe however in more detail how she has been under a lot of stress at home feeling like her husband's behavior and anger are oppressive to her. Feeling very nervous all of the time. Patient is denying any current suicidal thoughts . she is however still very depressed.. Today she was able to eat normally. Not showing any new neurologic signs. Tolerated medication well last night.  Follow-up Wednesday the 18th. Patient seen chart reviewed. Patient has no new complaints. Speech and cognition appeared to be stable he improved. Mood is anxious and she is still  troubled by and worried about the presentation she initially had but has not returned any of that. No suicidality no psychosis.  HPI:  Patient interviewed chart reviewed. 71 year old woman with a history of past strokes and a history of depression brought into the hospital with acute mental status changes. Patient was awake and interactive during the interview but has a great deal of difficulty communicating. She was not able to give any clear lucid account of her history. Parent lately was brought in yesterday with the family reporting an acute onset of change in her communication. Patient is being worked up for stroke. Has been seen by neurology already. Some of the workup is completed. Patient's speech varies in quality during the interview. She is able to tell me her name and is able to answer some other questions appropriately. She is able to follow simple one-step commands without difficulty. At other times however she will appear to not understand questions and will begin to repeat herself and use language that doesn't make sense. Patient was not able to tell me what medication she had been taking recently. I was able to see from the controlled substance database that she is still prescribed Xanax 0.5 mg twice a day. Patient talks several times about her husband being angry with her. Seems to have some degree of emotional distress but difficulty communicating it. She also on several occasions said "I might as well die". When saying this she did not seem to be particularly more distraught or emotional than other times and was not able to elaborate on it.  Social history: Patient lives with her husband. No children. Does not work outside the home. Sounds  like there is extended family involved socially.  Medical history: She has a documented past history of strokes. History of elevated blood pressure. COPD.  Substance abuse history: Doing no history of substance abuse. Chronic use of modest doses of  benzodiazepines without any documented abuse.  Past Psychiatric History: I have seen this patient once before in late 2016. Consult at that time was also for depression. She was more lucid and did not have this kind of mental state status. She did however have some odd symptoms such as a difficulty getting songs out of her head. She was being treated with low-dose olanzapine, low dose Xanax and Effexor at that time for a diagnosis of depression. No history of suicide attempts. No history of hospitalization. No known history of bipolar disorder.  Risk to Self: Is patient at risk for suicide?: No Risk to Others:   Prior Inpatient Therapy:   Prior Outpatient Therapy:    Past Medical History:  Past Medical History:  Diagnosis Date  . Anemia    past hx  . Anxiety   . Asthma   . COPD (chronic obstructive pulmonary disease) ([place])   . Hypertension   . Neck stiffness    limited movement s/p fusion c4-c5, c5-c6  . Shortness of breath dyspnea    exertion  . TIA (transient ischemic attack)    vision issues    Past Surgical History:  Procedure Laterality Date  . CARDIAC CATHETERIZATION     "many yrs ago"  . CATARACT EXTRACTION W/PHACO Left 01/17/2015   Procedure: CATARACT EXTRACTION PHACO AND INTRAOCULAR LENS PLACEMENT (IOC);  Surgeon: Ronnell Freshwater, MD;  Location: [facility];  Service: Ophthalmology;  Laterality: Left;  . [place]   C4-5-6, C4-5  . DILATION AND CURETTAGE OF UTERUS  1999  . TONSILLECTOMY  1956   Family History:  Family History  Problem Relation Age of Onset  . Stroke Mother   . CAD Mother   . CAD Father   . Alcohol abuse Brother    Family Psychiatric  History: Unknown Social History:  History  Alcohol Use  . Yes    Comment: occasionally - 1 drink/6 months     History  Drug Use No    Social History   Social History  . Marital status: Married    Spouse name: N/A  . Number of children: N/A  . Years of education: N/A    Social History Main Topics  . Smoking status: Light Tobacco Smoker    Years: 50.00    Types: Cigarettes  . Smokeless tobacco: Never Used     Comment: 0-2 cigs daily  . Alcohol use Yes     Comment: occasionally - 1 drink/6 months  . Drug use: No  . Sexual activity: Not Asked   Other Topics Concern  . None   Social History Narrative  . None   Additional Social History:    Allergies:   Allergies  Allergen Reactions  . Fluvirin [Flu Virus Vaccine] Diarrhea  . Penicillins Hives and Other (See Comments)    Has patient had a PCN reaction causing immediate rash, facial/tongue/throat swelling, SOB or lightheadedness with hypotension: No Has patient had a PCN reaction causing severe rash involving mucus membranes or skin necrosis: No Has patient had a PCN reaction that required hospitalization No Has patient had a PCN reaction occurring within the last 10 years: No If all of the above answers are "NO", then may proceed with Cephalosporin use.  Marland Kitchen  Tetanus Toxoids Diarrhea    Labs:  Results for orders placed or performed during the hospital encounter of 08/05/16 (from the past 48 hour(s))  Glucose, capillary     Status: None   Collection Time: 08/07/16 12:07 AM  Result Value Ref Range   Glucose-Capillary 97 65 - 99 mg/dL  Glucose, capillary     Status: None   Collection Time: 08/07/16 12:09 AM  Result Value Ref Range   Glucose-Capillary 94 65 - 99 mg/dL  Creatinine, serum     Status: Abnormal   Collection Time: 08/07/16  9:22 AM  Result Value Ref Range   Creatinine, Ser 0.41 (L) 0.44 - 1.00 mg/dL   GFR calc non Af Amer >60 >60 mL/min   GFR calc Af Amer >60 >60 mL/min    Comment: (NOTE) The eGFR has been calculated using the CKD EPI equation. This calculation has not been validated in all clinical situations. eGFR's persistently <60 mL/min signify possible Chronic Kidney Disease.     Current Facility-Administered Medications  Medication Dose Route Frequency Provider  Last Rate Last Dose  . acetaminophen (TYLENOL) tablet 650 mg  650 mg Oral Q4H PRN Alexis Hugelmeyer, DO   650 mg at 08/08/16 0424   Or  . acetaminophen (TYLENOL) solution 650 mg  650 mg Per Tube Q4H PRN Alexis Hugelmeyer, DO       Or  . acetaminophen (TYLENOL) suppository 650 mg  650 mg Rectal Q4H PRN Alexis Hugelmeyer, DO      . acyclovir (ZOVIRAX) 525 mg in dextrose 5 % 100 mL IVPB  10 mg/kg ([place]) Intravenous Q8H Epifanio Lesches, MD   525 mg at 08/08/16 1400  . albuterol (PROVENTIL) (2.5 MG/3ML) 0.083% nebulizer solution 2.5 mg  2.5 mg Nebulization Q6H PRN Alexis Hugelmeyer, DO      . ALPRAZolam Duanne Moron) tablet 0.5 mg  0.5 mg Oral BID Gonzella Lex, MD   0.5 mg at 08/08/16 1136  . aspirin EC tablet 81 mg  81 mg Oral Daily Alexis Hugelmeyer, DO   81 mg at 08/08/16 1136  . cefTRIAXone (ROCEPHIN) 2 g in dextrose 5 % 50 mL IVPB  2 g Intravenous Q12H Epifanio Lesches, MD   2 g at 08/08/16 1609  . clopidogrel (PLAVIX) tablet 75 mg  75 mg Oral Daily Alexis Hugelmeyer, DO   75 mg at 08/08/16 1136  . enoxaparin (LOVENOX) injection 40 mg  40 mg Subcutaneous Q24H Alexis Hugelmeyer, DO   40 mg at 08/08/16 0349  . mometasone-formoterol (DULERA) 100-5 MCG/ACT inhaler 2 puff  2 puff Inhalation BID Alexis Hugelmeyer, DO   2 puff at 08/08/16 0800  . OLANZapine zydis (ZYPREXA) disintegrating tablet 5 mg  5 mg Oral QHS Gonzella Lex, MD   5 mg at 08/07/16 2153  . roflumilast (DALIRESP) tablet 500 mcg  500 mcg Oral Daily Alexis Hugelmeyer, DO   500 mcg at 08/08/16 1136  . rosuvastatin (CRESTOR) tablet 20 mg  20 mg Oral q1800 Epifanio Lesches, MD   20 mg at 08/08/16 1713  . senna-docusate (Senokot-S) tablet 1 tablet  1 tablet Oral QHS PRN Alexis Hugelmeyer, DO      . tiotropium (SPIRIVA) inhalation capsule 18 mcg  18 mcg Inhalation Daily Gonzella Lex, MD      . vancomycin (VANCOCIN) IVPB 750 mg/150 ml premix  750 mg Intravenous Q12H Epifanio Lesches, MD   750 mg at 08/08/16 1400     Musculoskeletal: Strength & Muscle Tone: within normal limits and Patient appeared to  be symmetric in her muscle tone and activity. Used both of her hands and legs symmetrically with equal strength. Gait & Station: She did not get up out of bed but appeared as I mentioned to have equal strength. Patient leans: N/A  Psychiatric Specialty Exam: Physical Exam  Nursing note and vitals reviewed. Constitutional: She appears well-developed and well-nourished.  HENT:  Head: Normocephalic and atraumatic.  Eyes: Conjunctivae are normal. Pupils are equal, round, and reactive to light.  Neck: Normal range of motion.  Cardiovascular: Regular rhythm and normal heart sounds.   Respiratory: Effort normal.  GI: Soft.  Musculoskeletal: Normal range of motion.  Neurological: She is alert.  Skin: Skin is warm and dry.  Psychiatric: Her mood appears anxious. Her affect is not blunt. Her speech is delayed. She is slowed. Cognition and memory are not impaired. She does not express inappropriate judgment. She exhibits a depressed mood. She expresses no suicidal ideation. She expresses no suicidal plans. She is communicative. She exhibits normal recent memory and normal remote memory.    Review of Systems  Constitutional: Negative.   HENT: Negative.   Eyes: Negative.   Respiratory: Negative.   Cardiovascular: Negative.   Gastrointestinal: Negative.   Musculoskeletal: Negative.   Skin: Negative.   Neurological: Negative.   Psychiatric/Behavioral: Positive for depression and memory loss. Negative for hallucinations, substance abuse and suicidal ideas. The patient is nervous/anxious and has insomnia.     Blood pressure (!) 154/82, pulse 87, temperature 98.5 F (36.9 C), temperature source Oral, resp. rate 20, height '5\' 2"'  (1.575 m), weight 62.5 kg (137 lb 11.2 oz), SpO2 99 %.Body mass index is 25.19 kg/m.  General Appearance: Casual  Eye Contact:  Fair  Speech:  Blocked and As noted above she has odd  speech. Hard to tell if this is a true a facial or not. She was able to name some simple objects but not others. Able to answer some simple questions but not others. Able to produce some coherent sentences but at other times would appear to be blocked. At times I thought there was an emotional pattern to it but I could not be certain.  Volume:  Decreased  Mood:  Anxious  Affect:  Constricted  Thought Process:  Disorganized  Orientation:  Other:  Patient appeared to interact as though she understood the circumstances appropriately but could not tell me the name of the place where she was. She was able to tell me her name easily as well as the name of her husband.  Thought Content:  See previous notes  Suicidal Thoughts:  Yes.  without intent/plan  Homicidal Thoughts:  No  Memory:  Negative  Judgement:  Fair  Insight:  Shallow  Psychomotor Activity:  Restlessness  Concentration:  Concentration: Poor  Recall:  Poor  Fund of Knowledge:  Fair  Language:  Poor  Akathisia:  No  Handed:  Right  AIMS (if indicated):     Assets:  Desire for Improvement Financial Resources/Insurance Housing Social Support  ADL's:  Impaired  Cognition:  Impaired,  Mild  Sleep:        Treatment Plan Summary: Daily contact with patient to assess and evaluate symptoms and progress in treatment, Medication management and Plan Explained again the patient what happened. Empathized and did some psychoeducation. Encourage continued medicine compliance. No change to medication for today. Patient may be getting closer to discharge.  Disposition: Supportive therapy provided about ongoing stressors.  Alethia Berthold, MD 08/08/2016 7:15 PM

## 2016-08-08 NOTE — Progress Notes (Addendum)
  Speech Language Pathology Treatment: Dysphagia  Patient Details Name: Shannon Wilkerson MRN: [id number] DOB: 1945-12-25 Today's Date: 08/08/2016 Time: 7253-6644 SLP Time Calculation (min) (ACUTE ONLY): 45 min  Assessment / Plan / Recommendation Clinical Impression  Pt seen today for trial upgrade of po diet consistency. Pt has been tolerating her current full liquid diet(thins) w/ no overt s/s of aspiration reported; less N/V feelings per pt report. Pt positioned upright in bed given support; she continues to exhibit min decline in overall Cognitive/mental status easily distracted by her environment and min slow to respond at times.  Pt consumed po trials of mech soft foods during breakfast meal w/ no overt s/s of aspiration w/ the foods and thin liquids via cup - overt coughing noted x1 using larger straw from jug. No further coughing or throat clearing noted when drinking water/thins via cup and following general aspiration precautions. Oral phase c/b adequate bolus management for mastication/clearing; min slower time w/ mastication which appeared related to her overall fatigue from last night. After several boluses of foods, she stated she was "full". Discussed w/ pt following general aspiration precautions including small, single sips slowly and chewing foods well; moistening foods; easy to eat foods; and NO STRAWS.  Recommend diet consistency upgrade to Mech soft(dysphagia level 3) w/ thin liquids; Pills in Puree vs water at this time; aspiration precautions to include NO STRAWS. NSG updated.    HPI HPI: Pt is a 71 y.o. 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 States that her symptoms have not yet resolved, still feels confused.  Has trouble coming up with words to tell me what happened today. Rambling on  insensibly about her husband. Complains of urinary frequency, fatigue, sadness and frustration but denies fevers/chills, weakness, dizziness, chest pain, shortness of breath, N/V/C/D, abdominal pain, dysuria/frequency.  Pt exhibited significant expressive langauge deficits yesterday but this is much improved today per husband and staff. Pt has been tolerating her full liquid diet w/ thin liquids w/ no reported s/s of aspiration by pt/Staff. Pt a little sleepy post waking up - she stated she received something to help her sleep last night.      SLP Plan  Continue with current plan of care; f/u w/ toleration of diet while admitted       Recommendations  Diet recommendations: Dysphagia 3 (mechanical soft);Thin liquid Liquids provided via: Cup;No straw Medication Administration: Whole meds with puree Supervision: Patient able to self feed;Intermittent supervision to cue for compensatory strategies (tray setup) Compensations: Minimize environmental distractions;Slow rate;Small sips/bites;Lingual sweep for clearance of pocketing;Follow solids with liquid Postural Changes and/or Swallow Maneuvers: Seated upright 90 degrees;Upright 30-60 min after meal                General recommendations:  (Dietician f/u) Oral Care Recommendations: Oral care BID;Staff/trained caregiver to provide oral care Follow up Recommendations:  (TBD) SLP Visit Diagnosis: Dysphagia, oropharyngeal phase (R13.12) Plan: Continue with current plan of care       GO                 Jerilynn Som, MS, CCC-SLP [name],[name] 08/08/2016, 9:47 AM

## 2016-08-08 NOTE — Plan of Care (Signed)
Problem: SLP Dysphagia Goals Goal: Misc Dysphagia Goal Pt will safely tolerate po diet of least restrictive consistency w/ no overt s/s of aspiration noted by Staff/pt/family x3 sessions.    

## 2016-08-08 NOTE — Progress Notes (Signed)
Occupational Therapy Treatment Patient Details Name: Shannon Wilkerson MRN: [id number] DOB: 07-17-1945 Today's Date: 08/08/2016    History of present illness presented to ER secondary to speech difficulty, AMS; admitted for TIA/CVA work-up.  All imaging negative for acute change; noted chronic R PCA/occipital infarct. Patient currently declining LP per neuro notes.   OT comments  Pt seen for OT treatment session today focused on self care tasks including dressing, toileting and education/training in pursed lip breathing and body positioning to improve SOB. Pt reports "feeling sick today", able to follow simple commands consistently. Min guard for transfer to [facility]. RN notified of BM. Pt set up with lunch tray at end of session. Continue progressing towards goals.    Follow Up Recommendations  Home health OT;Supervision/Assistance - 24 hour    Equipment Recommendations  None recommended by OT    Recommendations for Other Services      Precautions / Restrictions Precautions Precautions: Fall Restrictions Weight Bearing Restrictions: No       Mobility Bed Mobility Overal bed mobility: Needs Assistance Bed Mobility: Supine to Sit;Sit to Supine     Supine to sit: Supervision Sit to supine: Supervision   General bed mobility comments: increased time, to get back in bed pt transitioned to quadruped   Transfers Overall transfer level: Needs assistance Equipment used: Rolling walker (2 wheeled) Transfers: Sit to/from [employer] Sit to Stand: Min guard Stand pivot transfers: Min guard       General transfer comment: tendency to lean forward at the waist, leaning with forearms on RW after transfer while standing, educated in upright posture to improve O2 sats and breathing, which pt agreed was helpful    Balance Overall balance assessment: Needs assistance Sitting-balance support: No upper extremity supported;Feet supported Sitting balance-Leahy Scale: Good      Standing balance support: Bilateral upper extremity supported;During functional activity Standing balance-Leahy Scale: Fair                             ADL either performed or assessed with clinical judgement   ADL Overall ADL's : Needs assistance/impaired Eating/Feeding: Sitting;Set up   Grooming: Sitting;Set up       Lower Body Bathing: Supervison/ safety;Set up;Sitting/lateral leans       Lower Body Dressing: Supervision/safety;Sitting/lateral leans;Set up   Toilet Transfer: Ambulation;BSC;Stand-pivot;Min guard Toilet Transfer Details (indicate cue type and reason): pt performed stand pivot transfer to [facility] with min guard  Toileting- Clothing Manipulation and Hygiene: Sitting/lateral lean;Supervision/safety;Set up       Functional mobility during ADLs: Min guard;Rolling walker       Vision Baseline Vision/History: Wears glasses Wears Glasses: At all times Patient Visual Report: No change from baseline Vision Assessment?: No apparent visual deficits   Perception     Praxis Praxis Praxis tested?: Within functional limits    Cognition Arousal/Alertness: Awake/alert Behavior During Therapy: WFL for tasks assessed/performed;Anxious Overall Cognitive Status: Within Functional Limits for tasks assessed                                          Exercises Other Exercises Other Exercises: pt educated in pursed lip breathing and body positioning to improve O2 sats and minimize SOB   Shoulder Instructions       General Comments      Pertinent Vitals/ Pain  Pain Assessment: No/denies pain  Home Living                                          Prior Functioning/Environment              Frequency  Min 1X/week        Progress Toward Goals  OT Goals(current goals can now be found in the care plan section)  Progress towards OT goals: Progressing toward goals  Acute Rehab OT Goals Patient Stated Goal:  get better OT Goal Formulation: With patient Time For Goal Achievement: 08/21/16 Potential to Achieve Goals: Good  Plan Discharge plan remains appropriate;Frequency remains appropriate    Co-evaluation                 End of Session Equipment Utilized During Treatment: Rolling walker;Oxygen  OT Visit Diagnosis: Other abnormalities of gait and mobility (R26.89);Other symptoms and signs involving cognitive function   Activity Tolerance Patient tolerated treatment well   Patient Left in bed;with call bell/phone within reach;with bed alarm set   Nurse Communication Other (comment) (notified RN of BM, pt asking about inhalers)    Functional Assessment Tool Used: Clinical judgement;AM-PAC 6 Clicks Daily Activity Functional Limitation: Self care Self Care Current Status (Z6109): At least 20 percent but less than 40 percent impaired, limited or restricted Self Care Goal Status (U0454): At least 1 percent but less than 20 percent impaired, limited or restricted   Time: 1145-1211 OT Time Calculation (min): 26 min  Charges: OT G-codes **NOT FOR INPATIENT CLASS** Functional Assessment Tool Used: Clinical judgement;AM-PAC 6 Clicks Daily Activity Functional Limitation: Self care Self Care Current Status (U9811): At least 20 percent but less than 40 percent impaired, limited or restricted Self Care Goal Status (B1478): At least 1 percent but less than 20 percent impaired, limited or restricted OT General Charges $OT Visit: 1 Procedure OT Treatments $Self Care/Home Management : 23-37 mins  Richrd Prime, MPH, MS, OTR/L ascom [phone] 08/08/16, 12:24 PM

## 2016-08-08 NOTE — Progress Notes (Signed)
[facility] - [place] [facility]   PATIENT NAME: Shannon Wilkerson    MR#:  [id number]  DATE OF BIRTH:  12-11-1945   awake, oriented. no fever.   CHIEF COMPLAINT:   Chief Complaint  Patient presents with  . Hypertension  . Altered Mental Status    REVIEW OF SYSTEMS:    Review of Systems  Constitutional: Negative for chills and fever.  HENT: Negative for hearing loss.   Eyes: Negative for blurred vision, double vision and photophobia.  Respiratory: Negative for cough, hemoptysis and shortness of breath.   Cardiovascular: Negative for palpitations, orthopnea and leg swelling.  Gastrointestinal: Negative for abdominal pain, diarrhea and vomiting.  Genitourinary: Negative for dysuria and urgency.  Musculoskeletal: Negative for myalgias and neck pain.  Skin: Negative for rash.  Neurological: Negative for dizziness, focal weakness, seizures, weakness and headaches.  Psychiatric/Behavioral: Negative for memory loss. The patient does not have insomnia.     Nutrition:  Tolerating Diet: Tolerating PT:      DRUG ALLERGIES:   Allergies  Allergen Reactions  . Fluvirin [Flu Virus Vaccine] Diarrhea  . Penicillins Hives and Other (See Comments)    Has patient had a PCN reaction causing immediate rash, facial/tongue/throat swelling, SOB or lightheadedness with hypotension: No Has patient had a PCN reaction causing severe rash involving mucus membranes or skin necrosis: No Has patient had a PCN reaction that required hospitalization No Has patient had a PCN reaction occurring within the last 10 years: No If all of the above answers are "NO", then may proceed with Cephalosporin use.  . Tetanus Toxoids Diarrhea    VITALS:  Blood pressure (!) 154/82, pulse 87, temperature 98.5 F (36.9 C), temperature source Oral, resp. rate 20, height  (1.575 m), weight 62.5 kg (137 lb 11.2 oz), SpO2 99 %.  PHYSICAL EXAMINATION:   Physical Exam  GENERAL:  71  y.o.-year-old patient lying in the  Bed,confused.  EYES: Pupils equal, round, reactive to light and accommodation. No scleral icterus. Extraocular muscles intact.  HEENT: Head atraumatic, normocephalic. Oropharynx and nasopharynx clear.  NECK:  Supple, no jugular venous distention. No thyroid enlargement, no tenderness.  LUNGS: Normal breath sounds bilaterally, no wheezing, rales,rhonchi or crepitation. No use of accessory muscles of respiration.  CARDIOVASCULAR: S1, S2 normal. No murmurs, rubs, or gallops.  ABDOMEN: Soft, nontender, nondistended. Bowel sounds present. No organomegaly or mass.  EXTREMITIES: No pedal edema, cyanosis, or clubbing.  NEUROLOGIC: , Awake, oriented, cranial nerves II through XII intact, power 5 /5 upper and lower extremities. Following directions clearly. And able to talk full sentences today.  PSYCHIATRIC: , Awake, oriented SKIN: No obvious rash, lesion, or ulcer.    LABORATORY PANEL:   CBC  Recent Labs Lab 08/05/16 2203  WBC 6.0  HGB 13.1  HCT 40.9  PLT 76*   ------------------------------------------------------------------------------------------------------------------  Chemistries   Recent Labs Lab 08/05/16 2203 08/07/16 0922  NA 137  --   K 4.1  --   CL 92*  --   CO2 37*  --   GLUCOSE 132*  --   BUN 10  --   CREATININE 0.38* 0.41*  CALCIUM 9.3  --   AST 20  --   ALT 14  --   ALKPHOS 64  --   BILITOT 0.7  --    ------------------------------------------------------------------------------------------------------------------  Cardiac Enzymes  Recent Labs Lab 08/06/16 1438  TROPONINI <0.03   ------------------------------------------------------------------------------------------------------------------  RADIOLOGY:  Dg Chest 1 View  Result Date: 08/06/2016 CLINICAL DATA:  Speech difficulties. Confusion. History of asthma and COPD. EXAM: CHEST 1 VIEW COMPARISON:  03/31/2013. FINDINGS: Mediastinum and hilar structures are  normal. Lungs are clear. No pleural effusion or pneumothorax. Heart size normal. Degenerative changes thoracic spine. IMPRESSION: No active disease. Electronically Signed   By: Maisie Fus  Register   On: 08/06/2016 14:53   Ct Head Wo Contrast  Result Date: 08/06/2016 CLINICAL DATA:  Altered mental status.  Confusion. EXAM: CT HEAD WITHOUT CONTRAST TECHNIQUE: Contiguous axial images were obtained from the base of the skull through the vertex without intravenous contrast. COMPARISON:  Head CT scan 08/05/2016. 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 Chronic microvascular ischemic change is noted. 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 No evidence of acute abnormality including hemorrhage, infarct, mass lesion, mass effect, midline shift or abnormal extra-axial fluid collection. No hydrocephalus or pneumocephalus. Vascular: Atherosclerosis noted. Skull: Intact. Sinuses/Orbits: Status post left lens extraction. Other: None. IMPRESSION: No acute abnormality. 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 Atherosclerosis. Electronically Signed   By: Drusilla Kanner M.D.   On: 08/06/2016 15:17     ASSESSMENT AND PLAN:   Principal Problem:   Acute delirium Active Problems:   COPD (chronic obstructive pulmonary disease) (HCC)   Hypertension   Major depression   Generalized anxiety disorder   CVA (cerebral vascular accident) (HCC)  1.Altered mental status likley due to possible stroke,but  Could not lie down still so she could not have MRI>CT head repeated  yesterday afternoon because of worsening mental status and its negative for acute bleed/stroke. For now treating her with IV abx and antivirals for possible encephalitis. Shel refused MRI of the brain and also LP. Continue antibiotics for 5 days. Alert awake and oriented today; on neurological exam also within normal limits. Acute delirium seems to be  improving. #2. history of depression; patient is on Xanax ZYprexa., Seen by psychiatry. Patient told neurologist yesterday that he would just be better off dead. Followed by psychiatry. #3 history of COPD: No wheezing no continue home inhalers. Deconditioning: Physical therapy consult.   All the records are reviewed and case discussed with Care Management/Social Workerr. Management plans discussed with the patient, family and they are in agreement.  CODE STATUS: DNR  TOTAL TIME TAKING CARE OF THIS PATIENT: 35 minutes.   POSSIBLE D/C IN 3-4DAYS, DEPENDING ON CLINICAL CONDITION.   Katha Hamming M.D on 08/08/2016 at 11:21 AM  Between 7am to 6pm - Pager - [phone]  After 6pm go to [email or web address] - password [email or web address] [facility]  [facility]  Office  [phone]  CC: Primary care physician; Alan Mulder, MD

## 2016-08-09 LAB — CREATININE, SERUM
Creatinine, Ser: 0.33 mg/dL — ABNORMAL LOW (ref 0.44–1.00)
GFR calc non Af Amer: >60 mL/min (ref >60)

## 2016-08-09 LAB — VANCOMYCIN, TROUGH: Vancomycin Tr: 7 ug/mL — ABNORMAL LOW (ref 15–20)

## 2016-08-09 MED ORDER — VANCOMYCIN HCL IN DEXTROSE 1-5 GM/200ML-% IV SOLN
1000.0000 mg | Freq: Two times a day (BID) | INTRAVENOUS | Status: DC
  Administered 2016-08-10 (×2): 1000 mg via INTRAVENOUS
  Filled 2016-08-09 (×4): qty 200

## 2016-08-09 NOTE — Consult Note (Signed)
Shannon Wilkerson Psychiatry Consult   Reason for Consult:  Consult for 71 year old woman with a history of depression and strokes currently in the hospital with altered mental status Referring Physician:  Vianne Bulls Patient Identification: Shannon Wilkerson MRN:  [id number] Principal Diagnosis: Acute delirium Diagnosis:   Patient Active Problem List   Diagnosis Date Noted  . Altered mental status [R41.82] 08/08/2016  . CVA (cerebral vascular accident) ([place]) [I63.9] 08/06/2016  . Acute delirium [R41.0] 08/06/2016  . Major depression [F32.9] 03/01/2015  . Generalized anxiety disorder [F41.1] 03/01/2015  . CVA (cerebral infarction) [I63.9] 10/27/2014  . COPD (chronic obstructive pulmonary disease) ([place]) [J44.9] 10/27/2014  . Chronic respiratory failure ([place]) [J96.10] 10/27/2014  . Anxiety [F41.9] 10/27/2014  . Hypertension [I10] 10/27/2014    Total Time spent with patient: 30 minutes  Subjective:   Shannon Wilkerson is a 71 y.o. female patient admitted with "it is, you know, that thing".  Follow-up for Tuesday the 36th. 71 year old woman who presented with altered mental status possible stroke. On reevaluation today her mental status is dramatically different from what we saw yesterday. The "akathisia" is almost entirely resolved. She is able to give a history and answer questions appropriately. She has some memory gaps and has very little memory of how she was yesterday. She is able to describe however in more detail how she has been under a lot of stress at home feeling like her husband's behavior and anger are oppressive to her. Feeling very nervous all of the time. Patient is denying any current suicidal thoughts . she is however still very depressed.. Today she was able to eat normally. Not showing any new neurologic signs. Tolerated medication well last night.  Follow-up Wednesday the 18th. Patient seen chart reviewed. Patient has no new complaints. Speech and cognition appeared to be stable he  improved. Mood is anxious and she is still troubled by and worried about the presentation she initially had but has not returned any of that. No suicidality no psychosis.  Consult follow-up for Thursday, April 19. Patient seen chart reviewed. Patient was awake alert and oriented. Advised [place] that she is confused as to why she is not being discharge. She is aware that she has been told that there may be some kind of infection but that the location of it is nonspecific. Patient herself has no complaints. Mental state appears to be at baseline. She says she feels a little bit weak but that is by her estimation simply from being deconditioned from being in the hospital. Sleeping adequately.  HPI:  Patient interviewed chart reviewed. 71 year old woman with a history of past strokes and a history of depression brought into the hospital with acute mental status changes. Patient was awake and interactive during the interview but has a great deal of difficulty communicating. She was not able to give any clear lucid account of her history. Parent lately was brought in yesterday with the family reporting an acute onset of change in her communication. Patient is being worked up for stroke. Has been seen by neurology already. Some of the workup is completed. Patient's speech varies in quality during the interview. She is able to tell me her name and is able to answer some other questions appropriately. She is able to follow simple one-step commands without difficulty. At other times however she will appear to not understand questions and will begin to repeat herself and use language that doesn't make sense. Patient was not able to tell me what medication she had been  taking recently. I was able to see from the controlled substance database that she is still prescribed Xanax 0.5 mg twice a day. Patient talks several times about her husband being angry with her. Seems to have some degree of emotional distress but difficulty  communicating it. She also on several occasions said "I might as well die". When saying this she did not seem to be particularly more distraught or emotional than other times and was not able to elaborate on it.  Social history: Patient lives with her husband. No children. Does not work outside the home. Sounds like there is extended family involved socially.  Medical history: She has a documented past history of strokes. History of elevated blood pressure. COPD.  Substance abuse history: Doing no history of substance abuse. Chronic use of modest doses of benzodiazepines without any documented abuse.  Past Psychiatric History: I have seen this patient once before in late 2016. Consult at that time was also for depression. She was more lucid and did not have this kind of mental state status. She did however have some odd symptoms such as a difficulty getting songs out of her head. She was being treated with low-dose olanzapine, low dose Xanax and Effexor at that time for a diagnosis of depression. No history of suicide attempts. No history of hospitalization. No known history of bipolar disorder.  Risk to Self: Is patient at risk for suicide?: No Risk to Others:   Prior Inpatient Therapy:   Prior Outpatient Therapy:    Past Medical History:  Past Medical History:  Diagnosis Date  . Anemia    past hx  . Anxiety   . Asthma   . COPD (chronic obstructive pulmonary disease) ([place])   . Hypertension   . Neck stiffness    limited movement s/p fusion c4-c5, c5-c6  . Shortness of breath dyspnea    exertion  . TIA (transient ischemic attack)    vision issues    Past Surgical History:  Procedure Laterality Date  . CARDIAC CATHETERIZATION     "many yrs ago"  . CATARACT EXTRACTION W/PHACO Left 01/17/2015   Procedure: CATARACT EXTRACTION PHACO AND INTRAOCULAR LENS PLACEMENT (IOC);  Surgeon: Ronnell Freshwater, MD;  Location: [place];  Service: Ophthalmology;  Laterality: Left;  .  [place]   C4-5-6, C4-5  . DILATION AND CURETTAGE OF UTERUS  1999  . TONSILLECTOMY  1956   Family History:  Family History  Problem Relation Age of Onset  . Stroke Mother   . CAD Mother   . CAD Father   . Alcohol abuse Brother    Family Psychiatric  History: Unknown Social History:  History  Alcohol Use  . Yes    Comment: occasionally - 1 drink/6 months     History  Drug Use No    Social History   Social History  . Marital status: Married    Spouse name: N/A  . Number of children: N/A  . Years of education: N/A   Social History Main Topics  . Smoking status: Light Tobacco Smoker    Years: 50.00    Types: Cigarettes  . Smokeless tobacco: Never Used     Comment: 0-2 cigs daily  . Alcohol use Yes     Comment: occasionally - 1 drink/6 months  . Drug use: No  . Sexual activity: Not Asked   Other Topics Concern  . None   Social History Narrative  . None   Additional Social History:  Allergies:   Allergies  Allergen Reactions  . Fluvirin [Flu Virus Vaccine] Diarrhea  . Penicillins Hives and Other (See Comments)    Has patient had a PCN reaction causing immediate rash, facial/tongue/throat swelling, SOB or lightheadedness with hypotension: No Has patient had a PCN reaction causing severe rash involving mucus membranes or skin necrosis: No Has patient had a PCN reaction that required hospitalization No Has patient had a PCN reaction occurring within the last 10 years: No If all of the above answers are "NO", then may proceed with Cephalosporin use.  . Tetanus Toxoids Diarrhea    Labs:  Results for orders placed or performed during the hospital encounter of 08/05/16 (from the past 48 hour(s))  Vancomycin, trough     Status: Abnormal   Collection Time: 08/09/16  1:18 PM  Result Value Ref Range   Vancomycin Tr 7 (L) 15 - 20 ug/mL  Creatinine, serum     Status: Abnormal   Collection Time: 08/09/16  1:18 PM  Result Value Ref Range    Creatinine, Ser 0.33 (L) 0.44 - 1.00 mg/dL   GFR calc non Af Amer >60 >60 mL/min   GFR calc Af Amer >60 >60 mL/min    Comment: (NOTE) The eGFR has been calculated using the CKD EPI equation. This calculation has not been validated in all clinical situations. eGFR's persistently <60 mL/min signify possible Chronic Kidney Disease.     Current Facility-Administered Medications  Medication Dose Route Frequency Provider Last Rate Last Dose  . acetaminophen (TYLENOL) tablet 650 mg  650 mg Oral Q4H PRN Alexis Hugelmeyer, DO   650 mg at 08/08/16 0424   Or  . acetaminophen (TYLENOL) solution 650 mg  650 mg Per Tube Q4H PRN Alexis Hugelmeyer, DO       Or  . acetaminophen (TYLENOL) suppository 650 mg  650 mg Rectal Q4H PRN Alexis Hugelmeyer, DO      . acyclovir (ZOVIRAX) 525 mg in dextrose 5 % 100 mL IVPB  10 mg/kg (Ideal) Intravenous Q8H Epifanio Lesches, MD   525 mg at 08/09/16 1506  . albuterol (PROVENTIL) (2.5 MG/3ML) 0.083% nebulizer solution 2.5 mg  2.5 mg Nebulization Q6H PRN Alexis Hugelmeyer, DO      . ALPRAZolam Duanne Moron) tablet 0.5 mg  0.5 mg Oral BID Gonzella Lex, MD   0.5 mg at 08/09/16 0947  . aspirin EC tablet 81 mg  81 mg Oral Daily Alexis Hugelmeyer, DO   81 mg at 08/09/16 0947  . cefTRIAXone (ROCEPHIN) 2 g in dextrose 5 % 50 mL IVPB  2 g Intravenous Q12H Epifanio Lesches, MD   2 g at 08/09/16 1618  . clopidogrel (PLAVIX) tablet 75 mg  75 mg Oral Daily Alexis Hugelmeyer, DO   75 mg at 08/09/16 0947  . enoxaparin (LOVENOX) injection 40 mg  40 mg Subcutaneous Q24H Alexis Hugelmeyer, DO   40 mg at 08/09/16 0234  . mometasone-formoterol (DULERA) 100-5 MCG/ACT inhaler 2 puff  2 puff Inhalation BID Alexis Hugelmeyer, DO   2 puff at 08/09/16 0948  . OLANZapine zydis (ZYPREXA) disintegrating tablet 5 mg  5 mg Oral QHS Gonzella Lex, MD   5 mg at 08/08/16 2210  . roflumilast (DALIRESP) tablet 500 mcg  500 mcg Oral Daily Alexis Hugelmeyer, DO   500 mcg at 08/09/16 0947  . rosuvastatin  (CRESTOR) tablet 20 mg  20 mg Oral q1800 Epifanio Lesches, MD   20 mg at 08/08/16 1713  . senna-docusate (Senokot-S) tablet 1 tablet  1  tablet Oral QHS PRN Alexis Hugelmeyer, DO      . tiotropium (SPIRIVA) inhalation capsule 18 mcg  18 mcg Inhalation Daily Gonzella Lex, MD   18 mcg at 08/09/16 0947  . vancomycin (VANCOCIN) IVPB 1000 mg/200 mL premix  1,000 mg Intravenous Q12H Epifanio Lesches, MD        Musculoskeletal: Strength & Muscle Tone: within normal limits and Patient appeared to be symmetric in her muscle tone and activity. Used both of her hands and legs symmetrically with equal strength. Gait & Station: She did not get up out of bed but appeared as I mentioned to have equal strength. Patient leans: N/A  Psychiatric Specialty Exam: Physical Exam  Nursing note and vitals reviewed. Constitutional: She appears well-developed and well-nourished.  HENT:  Head: Normocephalic and atraumatic.  Eyes: Conjunctivae are normal. Pupils are equal, round, and reactive to light.  Neck: Normal range of motion.  Cardiovascular: Regular rhythm and normal heart sounds.   Respiratory: Effort normal.  GI: Soft.  Musculoskeletal: Normal range of motion.  Neurological: She is alert.  Skin: Skin is warm and dry.  Psychiatric: Her mood appears not anxious. Her affect is not blunt. Her speech is not delayed. She is not slowed. Cognition and memory are not impaired. She does not express inappropriate judgment. She does not exhibit a depressed mood. She expresses no suicidal ideation. She expresses no suicidal plans. She is communicative. She exhibits normal recent memory and normal remote memory.    Review of Systems  Constitutional: Negative.   HENT: Negative.   Eyes: Negative.   Respiratory: Negative.   Cardiovascular: Negative.   Gastrointestinal: Negative.   Musculoskeletal: Negative.   Skin: Negative.   Neurological: Negative.   Psychiatric/Behavioral: Negative for depression,  hallucinations, memory loss, substance abuse and suicidal ideas. The patient is not nervous/anxious and does not have insomnia.     Blood pressure (!) 152/81, pulse 89, temperature 98 F (36.7 C), temperature source Oral, resp. rate 16, height '5\' 2"'  (1.575 m), weight 62.5 kg (137 lb 11.2 oz), SpO2 98 %.Body mass index is 25.19 kg/m.  General Appearance: Casual  Eye Contact:  Fair  Speech:  Clear and Coherent  Volume:  Decreased  Mood:  Anxious  Affect:  Appropriate  Thought Process:  Coherent  Orientation:  Full (Time, Place, and Person)  Thought Content:  Logical  Suicidal Thoughts:  No  Homicidal Thoughts:  No  Memory:  Negative  Judgement:  Fair  Insight:  Shallow  Psychomotor Activity:  Restlessness  Concentration:  Concentration: Fair  Recall:  Poor  Fund of Knowledge:  Fair  Language:  Poor  Akathisia:  No  Handed:  Right  AIMS (if indicated):     Assets:  Desire for Improvement Financial Resources/Insurance Housing Social Support  ADL's:  Impaired  Cognition:  Impaired,  Mild  Sleep:        Treatment Plan Summary: Daily contact with patient to assess and evaluate symptoms and progress in treatment, Medication management and Plan Patient appears to be back to her baseline. No sign of confusion delirium depression or psychosis. 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 Continue current psychiatric medicine. I will follow as needed.  Disposition: No evidence of imminent risk to self or others at present.   Patient does not meet criteria for psychiatric inpatient admission. Supportive therapy provided about ongoing stressors.  Alethia Berthold, MD 08/09/2016 5:52 PM

## 2016-08-09 NOTE — Care Management Note (Signed)
Case Management Note  Patient Details  Name: COTI BURD MRN: [id number] Date of Birth: [birth year]  Subjective/Objective:                 Physical therapy recommending home with home health.  No agency preference.  heads up referral to [facility] for [facility] PT Aide.  Patient does not know if she has a walker.   Action/Plan:   Expected Discharge Date:                  Expected Discharge Plan:   Home health  In-House Referral:   CM  Discharge planning Services     Post Acute Care Choice:    Choice offered to:     DME Arranged:    DME Agency:     HH Arranged:   yes    HH Agency:   [facility]  Status of Service:     If discussed at Long Length of Stay Meetings, dates discussed:    Additional Comments:  Eber Hong, RN 08/09/2016, 10:26 AM

## 2016-08-09 NOTE — Progress Notes (Addendum)
  Speech Language Pathology Treatment: Dysphagia  Patient Details Name: Shannon Wilkerson MRN: [id number] DOB: [birth year] Today's Date: 08/09/2016 Time: 0950-1030 SLP Time Calculation (min) (ACUTE ONLY): 40 min  Assessment / Plan / Recommendation Clinical Impression  Pt seen today for toleration of diet after diet upgrade yesterday to mech soft w/ thin liquids. Pt and NSG indicated good toleration of diet; no overt s/s of aspiration reported by Staff.  Pt consumed po trials of Thin liquids via Cup following general aspiration precautions(small, single sips slowly). No overtcoughing or throat clearing noted when drinking thins via Cup; no decline in vocal quality or respiratory status post trials. Oral phase c/b adequate bolus management w/ trials of mech soft foods as well. Discussed w/ pt the need to follow general aspiration precautions including small, single sips slowly and chewing foods well; moistening foods; choosing easy to eat foods; and NO STRAWS when drinking at this time since most of the meals and drinking are being done in bed. Provided pt w/ a menu and discussed menu/food choices. Recommend continue w/ current Mech Soft diet consistency w/ thin liquids; Pills in Puree vs water if any difficulty swallowing and clearing; aspiration precautions. No further skilled ST Services indicated at this time as pt appears at her baseline for swallowing, as well as her mentation is improving to her baseline per MD notes. NSG updated and will reconsult if needed.    HPI HPI: Pt is a 71 y.o. 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 States that her symptoms have not yet resolved, still feels confused.  Has trouble coming up with words to tell me what happened today. Rambling on insensibly about her husband. Complains  of urinary frequency, fatigue, sadness and frustration but denies fevers/chills, weakness, dizziness, chest pain, shortness of breath, N/V/C/D, abdominal pain, dysuria/frequency.  Pt exhibited significant expressive langauge deficits yesterday but this is much improved today per husband and staff. Pt has been tolerating her full liquid diet w/ thin liquids w/ no reported s/s of aspiration by pt/Staff. Pt stated she enjoyed her meal last night w/ no difficulty swallowing per pt/Staff.       SLP Plan  All goals met       Recommendations  Diet recommendations: Dysphagia 3 (mechanical soft);Thin liquid Liquids provided via: Cup;No straw Medication Administration: Whole meds with puree Supervision: Patient able to self feed Compensations: Minimize environmental distractions;Slow rate;Small sips/bites;Lingual sweep for clearance of pocketing;Follow solids with liquid Postural Changes and/or Swallow Maneuvers: Seated upright 90 degrees;Upright 30-60 min after meal                General recommendations:  (Dietician f/u) Oral Care Recommendations: Oral care BID;Staff/trained caregiver to provide oral care Follow up Recommendations: None SLP Visit Diagnosis: Dysphagia, oropharyngeal phase (R13.12) Plan: All goals met       GO               Orinda Kenner, MS, CCC-SLP Adonus Uselman 08/09/2016, 2:20 PM

## 2016-08-09 NOTE — Progress Notes (Addendum)
Physical Therapy Treatment Patient Details Name: Shannon Wilkerson MRN: [id number] DOB: November 29, 1945 Today's Date: 08/09/2016    History of Present Illness presented to ER secondary to speech difficulty, AMS; admitted for TIA/CVA work-up.  All imaging negative for acute change; noted chronic R PCA/occipital infarct. Patient currently declining LP per neuro notes.    PT Comments    Pt in bed, requesting to walk to bathroom.  Bed mobility without assist.  C/O general SOB - sats 92% on 3 lpm.  Pt sitting EOB for extended period of time stating she was hesitant to stand.  PT given RW and discussed using RW for energy conservation and to increase confidence for mobility skills.  She stood and was able to ambulate to bathroom with min guard and frequent self initiated rest breaks, occasionally leaning on walker to rest.  Pt with no LOB while sitting on commode and able to attend to self care needs without assist.  On return to bed, she was more fatigued and took more self initiated rest breaks.  On return to bed, O2 sats decreased to 88% on 3 lpm but quickly returned to low 90's.  Pt stated she had not had her inhaler or her anxiety meds this am which may be attributing to her decreased mobility quality.  She stated she is unsure if she has a walker at home but it would benefit her to have one upon discharge.  She does have some decreased safety awareness at times for hand placements with transitions but corrects with verbal cues.  Nursing notified of request for inhalers and anxiety meds.   Follow Up Recommendations  Home health PT;Supervision/Assistance - 24 hour     Equipment Recommendations  Rolling walker with 5" wheels    Recommendations for Other Services       Precautions / Restrictions Precautions Precautions: Fall Restrictions Weight Bearing Restrictions: No    Mobility  Bed Mobility Overal bed mobility: Modified Independent Bed Mobility: Supine to Sit;Sit to Supine     Supine to  sit: Supervision Sit to supine: Supervision      Transfers Overall transfer level: Needs assistance Equipment used: Rolling walker (2 wheeled) Transfers: Sit to/from Stand Sit to Stand: Min guard         General transfer comment: leans forward frequently on walker for support and rest.    Ambulation/Gait Ambulation/Gait assistance: Min guard Ambulation Distance (Feet): 30 Feet Assistive device: Rolling walker (2 wheeled) Gait Pattern/deviations: Step-to pattern;Step-through pattern;Trunk flexed   Gait velocity interpretation: Below normal speed for age/gender General Gait Details: RW used today due to hesitancy with gait and forward flexed posture   Stairs            Wheelchair Mobility    Modified Rankin (Stroke Patients Only)       Balance Overall balance assessment: Needs assistance Sitting-balance support: No upper extremity supported;Feet supported Sitting balance-Leahy Scale: Good     Standing balance support: Bilateral upper extremity supported;During functional activity Standing balance-Leahy Scale: Fair                              Cognition Arousal/Alertness: Awake/alert Behavior During Therapy: WFL for tasks assessed/performed Overall Cognitive Status: Within Functional Limits for tasks assessed  Exercises Other Exercises Other Exercises: to toilet with ind self care    General Comments        Pertinent Vitals/Pain Pain Assessment: 0-10 Pain Score: 6  Pain Location: R hand/wrist Pain Descriptors / Indicators: Pins and needles Pain Intervention(s): Limited activity within patient's tolerance;Monitored during session    Home Living                      Prior Function            PT Goals (current goals can now be found in the care plan section) Progress towards PT goals: Progressing toward goals    Frequency    Min 2X/week      PT Plan       Co-evaluation             End of Session Equipment Utilized During Treatment: Gait belt;Oxygen Activity Tolerance: Patient limited by fatigue Patient left: in bed;with bed alarm set;with call bell/phone within reach Nurse Communication: Mobility status       Time: 7846-9629 PT Time Calculation (min) (ACUTE ONLY): 28 min  Charges:  $Gait Training: 8-22 mins $Therapeutic Exercise: 8-22 mins                    G Codes:       Danielle Dess, PTA 08/09/16, 9:47 AM

## 2016-08-09 NOTE — Progress Notes (Signed)
Pharmacy Antibiotic Note  Shannon Wilkerson is a 71 y.o. female admitted on 08/05/2016 with encephalitis/fever.  Pharmacy has been consulted for Vancomycin dosing.  Currently on Vancomycin  IV q12h. Vancomycin trough resulted at 63mcg/ml, below target of 15-46mcg/ml.  MD planning to treat for 5 days, this is day 4 of therapy.  Plan: Will increase to Vancomycin 1g IV q12h with next dose. If continues therapy past tomorrow will need to reassess.  Targeting a trough level of 15-20 mcg/dl.   Height:  (157.5 cm) Weight: 137 lb 11.2 oz (62.5 kg) IBW/kg (Calculated) : 50.1  Temp (24hrs), Avg:99.3 F (37.4 C), Min:98 F (36.7 C), Max:100.8 F (38.2 C)   Recent Labs Lab 08/05/16 2203 08/07/16 0922 08/09/16 1318  WBC 6.0  --   --   CREATININE 0.38* 0.41* 0.33*  VANCOTROUGH  --   --  7*    Estimated Creatinine Clearance: 57.7 mL/min (A) (by C-G formula based on SCr of 0.33 mg/dL (L)).    Allergies  Allergen Reactions  . Fluvirin [Flu Virus Vaccine] Diarrhea  . Penicillins Hives and Other (See Comments)    Has patient had a PCN reaction causing immediate rash, facial/tongue/throat swelling, SOB or lightheadedness with hypotension: No Has patient had a PCN reaction causing severe rash involving mucus membranes or skin necrosis: No Has patient had a PCN reaction that required hospitalization No Has patient had a PCN reaction occurring within the last 10 years: No If all of the above answers are "NO", then may proceed with Cephalosporin use.  . Tetanus Toxoids Diarrhea    Antimicrobials this admission: Vancomcyin  4/16 >>  Ceftriaxone  4/16  >>  Acyclovir 4/16 >>>  Dose adjustments this admission:   Shannon Wilkerson results:  BCx:   UCx:    Sputum:    MRSA PCR:   Thank you for allowing pharmacy to be a part of this patient's care.  Shannon Wilkerson 08/09/2016 5:04 PM

## 2016-08-09 NOTE — Progress Notes (Signed)
[facility] [place] at [facility]   PATIENT NAME: Shannon Wilkerson    MR#:  [id number]  DATE OF BIRTH:  [birth year]   awake, oriented. no fever.   CHIEF COMPLAINT:   Chief Complaint  Patient presents with  . Hypertension  . Altered Mental Status    REVIEW OF SYSTEMS:    Review of Systems  Constitutional: Negative for chills and fever.  HENT: Negative for hearing loss.   Eyes: Negative for blurred vision, double vision and photophobia.  Respiratory: Negative for cough, hemoptysis and shortness of breath.   Cardiovascular: Negative for palpitations, orthopnea and leg swelling.  Gastrointestinal: Negative for abdominal pain, diarrhea and vomiting.  Genitourinary: Negative for dysuria and urgency.  Musculoskeletal: Negative for myalgias and neck pain.  Skin: Negative for rash.  Neurological: Negative for dizziness, focal weakness, seizures, weakness and headaches.  Psychiatric/Behavioral: Negative for memory loss. The patient does not have insomnia.     Nutrition:  Tolerating Diet: Tolerating PT:      DRUG ALLERGIES:   Allergies  Allergen Reactions  . Fluvirin [Flu Virus Vaccine] Diarrhea  . Penicillins Hives and Other (See Comments)    Has patient had a PCN reaction causing immediate rash, facial/tongue/throat swelling, SOB or lightheadedness with hypotension: No Has patient had a PCN reaction causing severe rash involving mucus membranes or skin necrosis: No Has patient had a PCN reaction that required hospitalization No Has patient had a PCN reaction occurring within the last 10 years: No If all of the above answers are "NO", then may proceed with Cephalosporin use.  . Tetanus Toxoids Diarrhea    VITALS:  Blood pressure (!) 149/80, pulse (!) 102, temperature 99.7 F (37.6 C), temperature source Oral, resp. rate 18, height  (1.575 m), weight 62.5 kg (137 lb 11.2 oz), SpO2 93 %.  PHYSICAL EXAMINATION:   Physical Exam  GENERAL:  71  y.o.-year-old patient lying in the  Bed,confused.  EYES: Pupils equal, round, reactive to light and accommodation. No scleral icterus. Extraocular muscles intact.  HEENT: Head atraumatic, normocephalic. Oropharynx and nasopharynx clear.  NECK:  Supple, no jugular venous distention. No thyroid enlargement, no tenderness.  LUNGS: Normal breath sounds bilaterally, no wheezing, rales,rhonchi or crepitation. No use of accessory muscles of respiration.  CARDIOVASCULAR: S1, S2 normal. No murmurs, rubs, or gallops.  ABDOMEN: Soft, nontender, nondistended. Bowel sounds present. No organomegaly or mass.  EXTREMITIES: No pedal edema, cyanosis, or clubbing.  NEUROLOGIC: , Awake, oriented, cranial nerves II through XII intact, power 5 /5 upper and lower extremities. Following directions clearly. And able to talk full sentences today.  PSYCHIATRIC: , Awake, oriented SKIN: No obvious rash, lesion, or ulcer.    LABORATORY PANEL:   CBC  Recent Labs Lab 08/05/16 2203  WBC 6.0  HGB 13.1  HCT 40.9  PLT 76*   ------------------------------------------------------------------------------------------------------------------  Chemistries   Recent Labs Lab 08/05/16 2203 08/07/16 0922  NA 137  --   K 4.1  --   CL 92*  --   CO2 37*  --   GLUCOSE 132*  --   BUN 10  --   CREATININE 0.38* 0.41*  CALCIUM 9.3  --   AST 20  --   ALT 14  --   ALKPHOS 64  --   BILITOT 0.7  --    ------------------------------------------------------------------------------------------------------------------  Cardiac Enzymes  Recent Labs Lab 08/06/16 1438  TROPONINI <0.03   ------------------------------------------------------------------------------------------------------------------  RADIOLOGY:  No results found.   ASSESSMENT AND PLAN:  Principal Problem:   Acute delirium Active Problems:   COPD (chronic obstructive pulmonary disease) (HCC)   Hypertension   Major depression   Generalized  anxiety disorder   CVA (cerebral vascular accident) (HCC)   Altered mental status  1.Altered mental status likley due to possible stroke,but  Could not lie down still so she could not have MRI>CT head repeated   because of worsening mental status and its negative for acute bleed/stroke. For now treating her with IV abx and antivirals for possible encephalitis. Shel refused MRI of the brain and also LP. Continue antibiotics for 5 days. She is on day 4 of antibiotics Alert awake and oriented today; on neurological exam also within normal limits. Acute delirium seems to be improving. Patient is to have a low-grade fever. Watch closely.  #2. history of depression; patient is on Xanax ZYprexa., Seen by psychiatry.    #3 history of COPD: No wheezing no continue home inhalers. On chronic oxygen 2-2 and half liters.   Deconditioning: Physical therapy recommends home health physical therapy with rolling walker.   All the records are reviewed and case discussed with Care Management/Social Workerr. Management plans discussed with the patient, family and they are in agreement.  CODE STATUS: DNR  TOTAL TIME TAKING CARE OF THIS PATIENT: 35 minutes.   POSSIBLE D/C IN 3-4DAYS, DEPENDING ON CLINICAL CONDITION.   Katha Hamming M.D on 08/09/2016 at 9:45 AM  Between 7am to 6pm - Pager - [phone]  After 6pm go to [email or web address] - password [email or web address] [facility]  [facility] [place] [facility]  Office  [phone]  CC: Primary care physician; Alan Mulder, MD

## 2016-08-09 NOTE — Care Management Important Message (Signed)
Important Message  Patient Details  Name: Shannon Wilkerson MRN: [id number] Date of Birth: 1945/08/29   Medicare Important Message Given:  Yes  Signed IM notice given    Eber Hong, RN 08/09/2016, 8:19 AM

## 2016-08-09 NOTE — Care Management (Signed)
patient is agreeable to home health and no agency preference.  Referral to [facility] for SN, PT, Aide.  She is not sure if she has a walker at home

## 2016-08-09 NOTE — Progress Notes (Signed)
While rounding, CH made initial visit to 106. Pt was in bed and presented agitated. Husband was bedside. Pt was upset because of additional bloodwork being done and was uncertain why. RN explained and Pt seemed a bit calmer. Husband stated that her medications were causing her to be a little less like herself. CH provided the ministry of presence, listening, and prayer. CH is available for follow up as needed.    08/09/16 1400  Clinical Encounter Type  Visited With Patient;Patient and family together;Health care provider  Visit Type Initial;Spiritual support  Consult/Referral To Chaplain  Spiritual Encounters  Spiritual Needs Prayer;Emotional

## 2016-08-10 LAB — URINE CULTURE: Culture: NO GROWTH

## 2016-08-10 MED ORDER — OLANZAPINE 5 MG PO TBDP: 5.0000 mg | ORAL_TABLET | Freq: Every day | ORAL | 0 refills | Status: AC

## 2016-08-10 MED ORDER — DOXYCYCLINE HYCLATE 100 MG PO TABS: 100.0000 mg | ORAL_TABLET | Freq: Two times a day (BID) | ORAL | 0 refills | Status: DC

## 2016-08-10 MED ORDER — ALPRAZOLAM 0.5 MG PO TABS: 0.5000 mg | ORAL_TABLET | Freq: Two times a day (BID) | ORAL | 0 refills | Status: DC | PRN

## 2016-08-10 MED ORDER — ASPIRIN 81 MG PO TBEC: 81.0000 mg | DELAYED_RELEASE_TABLET | Freq: Every day | ORAL | 2 refills | Status: AC

## 2016-08-10 MED ORDER — ROSUVASTATIN CALCIUM 20 MG PO TABS: 20.0000 mg | ORAL_TABLET | Freq: Every day | ORAL | 2 refills | Status: DC

## 2016-08-10 NOTE — Care Management (Signed)
Patient verbally confirms that she has a walker at home.  Family to transport home.  Agreeable to home health.  Requested order for SN and PT.  [facility] aware

## 2016-08-10 NOTE — Progress Notes (Signed)
Patient discharged home per MD order. Prescription given to patient. All discharge instructions given and all questions answered. Patient and husband verbalized understanding of all discharge instructions.

## 2016-08-13 NOTE — Discharge Summary (Signed)
[facility] [place] at [facility]   PATIENT NAME: Shannon Wilkerson    MR#:  [id number]  DATE OF BIRTH:  1946/02/22  DATE OF ADMISSION:  08/05/2016   ADMITTING PHYSICIAN: Tonye Royalty, DO  DATE OF DISCHARGE: 08/10/2016  5:06 PM  PRIMARY CARE PHYSICIAN: Alan Mulder, MD   ADMISSION DIAGNOSIS:   Cerebrovascular accident (CVA), unspecified mechanism (HCC) [I63.9]  DISCHARGE DIAGNOSIS:   Principal Problem:   Acute delirium Active Problems:   COPD (chronic obstructive pulmonary disease) (HCC)   Hypertension   Major depression   Generalized anxiety disorder   CVA (cerebral vascular accident) (HCC)   Altered mental status   SECONDARY DIAGNOSIS:   Past Medical History:  Diagnosis Date  . Anemia    past hx  . Anxiety   . Asthma   . COPD (chronic obstructive pulmonary disease) (HCC)   . Hypertension   . Neck stiffness    limited movement s/p fusion c4-c5, c5-c6  . Shortness of breath dyspnea    exertion  . TIA (transient ischemic attack)    vision issues    HOSPITAL COURSE:   71 year old female with past medical history significant for depression, multiple strokes in the past, hypertension, anxiety, COPD, asthma and TIA presents to hospital secondary to altered mental status.  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 -Appreciate neurology and psychiatry follow-up. -Patient's mental status improved back to baseline. She has a flat affect at baseline. -Refused MRI and lumbar puncture. -But due to her mental status presentation and fevers, she was empirically treated with IV antibiotics and acyclovir for encephalitis. -5 days of antibiotics were recommended and patient is being discharged on doxycycline for her left calf cellulitis. But she finished IV antibiotics 5 days in the hospital. -. CT of the head and neck angiogram did not reveal any hemodynamically significant stenosis. -She probably had akathisia according  to psychiatrist. She also has some memory and anxiety behavior. -Part of the presentation could also be psychogenic in cause according to psychiatry. However since patient is back to her baseline, she is being discharged.  #2 depression and anxiety-appreciate psychiatry consult. Being discharged on Zyprexa. Outpatient follow-up recommended.  #3 COPD-stable. Continue inhalers and nebulizers as needed at discharge. Patient will be on 3 L oxygen at discharge.  #4 history of TIAs and stroke-no residual neurological deficits. Continue aspirin and Plavix and statin has been added.  Patient is back to baseline. Physical therapy recommended home health.  DISCHARGE CONDITIONS:   Guarded  CONSULTS OBTAINED:   Treatment Team:  Kym Groom, MD Audery Amel, MD  DRUG ALLERGIES:   Allergies  Allergen Reactions  . Fluvirin [Flu Virus Vaccine] Diarrhea  . Penicillins Hives and Other (See Comments)    Has patient had a PCN reaction causing immediate rash, facial/tongue/throat swelling, SOB or lightheadedness with hypotension: No Has patient had a PCN reaction causing severe rash involving mucus membranes or skin necrosis: No Has patient had a PCN reaction that required hospitalization No Has patient had a PCN reaction occurring within the last 10 years: No If all of the above answers are "NO", then may proceed with Cephalosporin use.  . Tetanus Toxoids Diarrhea   DISCHARGE MEDICATIONS:   Allergies as of 08/10/2016      Reactions   Fluvirin [flu Virus Vaccine] Diarrhea   Penicillins Hives, Other (See Comments)   Has patient had a PCN reaction causing immediate rash, facial/tongue/throat swelling, SOB or lightheadedness with hypotension: No Has patient had a PCN  reaction causing severe rash involving mucus membranes or skin necrosis: No Has patient had a PCN reaction that required hospitalization No Has patient had a PCN reaction occurring within the last 10 years: No If all of the  above answers are "NO", then may proceed with Cephalosporin use.   Tetanus Toxoids Diarrhea      Medication List    STOP taking these medications   diphenhydramine-acetaminophen 25-500 MG Tabs tablet Commonly known as:  TYLENOL PM   ibuprofen 200 MG tablet Commonly known as:  ADVIL,MOTRIN     TAKE these medications   acetaminophen 325 MG tablet Commonly known as:  TYLENOL Take 2 tablets (650 mg total) by mouth every 6 (six) hours as needed for mild pain or fever.   ALPRAZolam 0.5 MG tablet Commonly known as:  XANAX Take 1 tablet (0.5 mg total) by mouth 2 (two) times daily as needed for anxiety.   aspirin 81 MG EC tablet Take 1 tablet (81 mg total) by mouth daily.   clopidogrel 75 MG tablet Commonly known as:  PLAVIX Take 75 mg by mouth daily.   doxycycline 100 MG tablet Commonly known as:  VIBRA-TABS Take 1 tablet (100 mg total) by mouth 2 (two) times daily. X 4 more days   levalbuterol 45 MCG/ACT inhaler Commonly known as:  XOPENEX HFA Inhale 2 puffs into the lungs every 6 (six) hours as needed for wheezing or shortness of breath.   mometasone-formoterol 100-5 MCG/ACT Aero Commonly known as:  DULERA Inhale 2 puffs into the lungs 2 (two) times daily.   OLANZapine zydis 5 MG disintegrating tablet Commonly known as:  ZYPREXA Take 1 tablet (5 mg total) by mouth at bedtime.   roflumilast 500 MCG Tabs tablet Commonly known as:  DALIRESP Take 500 mcg by mouth daily.   rosuvastatin 20 MG tablet Commonly known as:  CRESTOR Take 1 tablet (20 mg total) by mouth daily at 6 PM.        DISCHARGE INSTRUCTIONS:   1. Psychiatrist f/u in 1-2 weeks 2. PCP f/u in 2 weeks  DIET:   Cardiac diet  ACTIVITY:   Activity as tolerated  OXYGEN:   Home Oxygen: Yes Oxygen Delivery: 3 L via nasal cannula  DISCHARGE LOCATION:   home   If you experience worsening of your admission symptoms, develop shortness of breath, life threatening emergency, suicidal or homicidal  thoughts you must seek medical attention immediately by calling 911 or calling your MD immediately  if symptoms less severe.  You Must read complete instructions/literature along with all the possible adverse reactions/side effects for all the Medicines you take and that have been prescribed to you. Take any new Medicines after you have completely understood and accpet all the possible adverse reactions/side effects.   Please note  You were cared for by a hospitalist during your hospital stay. If you have any questions about your discharge medications or the care you received while you were in the hospital after you are discharged, you can call the unit and asked to speak with the hospitalist on call if the hospitalist that took care of you is not available. Once you are discharged, your primary care physician will handle any further medical issues. Please note that NO REFILLS for any discharge medications will be authorized once you are discharged, as it is imperative that you return to your primary care physician (or establish a relationship with a primary care physician if you do not have one) for your aftercare needs so that  they can reassess your need for medications and monitor your lab values.    On the day of Discharge:  VITAL SIGNS:   Blood pressure 129/66, pulse 98, temperature 98.5 F (36.9 C), temperature source Oral, resp. rate 20, height  (1.575 m), weight 62.5 kg (137 lb 11.2 oz), SpO2 97 %.  PHYSICAL EXAMINATION:    GENERAL:  71 y.o.-year-old patient lying in the bed with no acute distress.  EYES: Pupils equal, round, reactive to light and accommodation. No scleral icterus. Extraocular muscles intact.  HEENT: Head atraumatic, normocephalic. Oropharynx and nasopharynx clear.  NECK:  Supple, no jugular venous distention. No thyroid enlargement, no tenderness.  LUNGS: Normal breath sounds bilaterally, no wheezing, rales,rhonchi or crepitation. No use of accessory muscles of  respiration.  CARDIOVASCULAR: S1, S2 normal. No murmurs, rubs, or gallops.  ABDOMEN: Soft, non-tender, non-distended. Bowel sounds present. No organomegaly or mass.  EXTREMITIES: No pedal edema, cyanosis, or clubbing. Minimal erythema without swelling or redness right about her Achilles tendon NEUROLOGIC: Cranial nerves II through XII are intact. Muscle strength 5/5 in all extremities. Sensation intact. Gait not checked.  PSYCHIATRIC: The patient is alert and oriented x 3. Very anxious SKIN: No obvious rash, lesion, or ulcer.   DATA REVIEW:   CBC No results for input(s): WBC, HGB, HCT, PLT in the last 168 hours.  Chemistries   Recent Labs Lab 08/09/16 1318  CREATININE 0.33*     Microbiology Results  Results for orders placed or performed during the hospital encounter of 08/05/16  CULTURE, BLOOD (ROUTINE X 2) w Reflex to ID Panel     Status: None (Preliminary result)   Collection Time: 08/09/16 10:07 AM  Result Value Ref Range Status   Specimen Description BLOOD LEFT ASSIST CONTROL  Final   Special Requests Blood Culture adequate volume  Final   Culture NO GROWTH 4 DAYS  Final   Report Status PENDING  Incomplete  CULTURE, BLOOD (ROUTINE X 2) w Reflex to ID Panel     Status: None (Preliminary result)   Collection Time: 08/09/16 10:18 AM  Result Value Ref Range Status   Specimen Description BLOOD LEFT HAND  Final   Special Requests Blood Culture adequate volume  Final   Culture NO GROWTH 4 DAYS  Final   Report Status PENDING  Incomplete  Urine culture     Status: None   Collection Time: 08/09/16 12:25 PM  Result Value Ref Range Status   Specimen Description URINE, CLEAN CATCH  Final   Special Requests NONE  Final   Culture   Final    NO GROWTH Performed at [facility], [address], [place], [place] [address]    Report Status 08/10/2016 FINAL  Final    RADIOLOGY:  No results found.   Management plans discussed with the patient, family and they are in  agreement.  CODE STATUS:  Code Status History    Date Active Date Inactive Code Status Order ID Comments User Context   08/06/2016  8:05 AM 08/10/2016  8:26 PM DNR [id number]  Tonye Royalty, DO Inpatient   08/06/2016 12:11 AM 08/06/2016  1:26 AM DNR [id number]  Blanca Friend, RN ED   02/28/2015  9:36 PM 03/02/2015  6:09 PM Full Code [id number]  Ramonita Lab, MD Inpatient   10/27/2014  8:44 PM 10/28/2014  7:01 PM Full Code [id number]  Gale Journey, MD Inpatient    Questions for Most Recent Historical Code Status (Order [id number])    Question Answer  Comment   In the event of cardiac or respiratory ARREST Do not call a "code blue"    In the event of cardiac or respiratory ARREST Do not perform Intubation, CPR, defibrillation or ACLS    In the event of cardiac or respiratory ARREST Use medication by any route, position, wound care, and other measures to relive pain and suffering. May use oxygen, suction and manual treatment of airway obstruction as needed for comfort.       TOTAL TIME TAKING CARE OF THIS PATIENT: 37 minutes.    Enid Baas M.D on 08/13/2016 at 3:14 PM  Between 7am to 6pm - Pager - [phone]  After 6pm go to [email or web address] - Social research officer, government  [facility] [place] [facility]  Office  [phone]  CC: Primary care physician; Alan Mulder, MD   Note: This dictation was prepared with Dragon dictation along with smaller phrase technology. Any transcriptional errors that result from this process are unintentional.

## 2016-08-14 LAB — CULTURE, BLOOD (ROUTINE X 2)
CULTURE: NO GROWTH
CULTURE: NO GROWTH
SPECIAL REQUESTS: ADEQUATE
Special Requests: ADEQUATE

## 2016-08-27 DIAGNOSIS — Z8659 Personal history of other mental and behavioral disorders: Secondary | ICD-10-CM | POA: Insufficient documentation

## 2017-05-09 ENCOUNTER — Other Ambulatory Visit: Payer: Self-pay

## 2017-05-09 MED ORDER — LEVALBUTEROL TARTRATE 45 MCG/ACT IN AERO: INHALATION_SPRAY | RESPIRATORY_TRACT | 0 refills | Status: AC

## 2017-05-09 NOTE — Telephone Encounter (Signed)
Pt called need refills for xopenex we esend refills and pt aware

## 2017-05-15 ENCOUNTER — Ambulatory Visit: Payer: Self-pay | Admitting: Internal Medicine

## 2017-05-21 ENCOUNTER — Ambulatory Visit (INDEPENDENT_AMBULATORY_CARE_PROVIDER_SITE_OTHER): Payer: Medicare Other | Admitting: Internal Medicine

## 2017-05-21 ENCOUNTER — Encounter: Payer: Self-pay | Admitting: Internal Medicine

## 2017-05-21 VITALS — BP 151/87 | HR 110 | Resp 16 | Ht 62.0 in | Wt 125.0 lb

## 2017-05-21 DIAGNOSIS — J9611 Chronic respiratory failure with hypoxia: Secondary | ICD-10-CM | POA: Diagnosis not present

## 2017-05-21 DIAGNOSIS — J449 Chronic obstructive pulmonary disease, unspecified: Secondary | ICD-10-CM | POA: Diagnosis not present

## 2017-05-21 DIAGNOSIS — F17210 Nicotine dependence, cigarettes, uncomplicated: Secondary | ICD-10-CM | POA: Diagnosis not present

## 2017-05-21 DIAGNOSIS — I63439 Cerebral infarction due to embolism of unspecified posterior cerebral artery: Secondary | ICD-10-CM | POA: Diagnosis not present

## 2017-05-21 NOTE — Progress Notes (Signed)
[facility] [address] [place], [place] [id number]  Pulmonary Sleep Medicine  Office Visit Note  Patient Name: Shannon HartJoann S Julio DOB: 07-05-1945 MRN [id number]  Date of Service: 05/21/2017  Complaints/HPI: She had a stroke she states since her last visit. She was presenting with AMS at the time. Patient had difficulty with speech. As far as her breathing is concerned she is on her oxygen. Still will smoke on occasion. She has not cough and congestion on occasion. She has no chest pain noted no fevers or chills. She states that she does not have wheeze. She continues to use her inhalers  ROS  General: (-) fever, (-) chills, (-) night sweats, (-) weakness Skin: (-) rashes, (-) itching,. Eyes: (-) visual changes, (-) redness, (-) itching. Nose and Sinuses: (-) nasal stuffiness or itchiness, (-) postnasal drip, (-) nosebleeds, (-) sinus trouble. Mouth and Throat: (-) sore throat, (-) hoarseness. Neck: (-) swollen glands, (-) enlarged thyroid, (-) neck pain. Respiratory: - cough, (-) bloody sputum, + shortness of breath, - wheezing. Cardiovascular: - ankle swelling, (-) chest pain. Lymphatic: (-) lymph node enlargement. Neurologic: (-) numbness, (-) tingling. Psychiatric: (-) anxiety, (-) depression   Current Medication: Outpatient Encounter Medications as of 05/21/2017  Medication Sig  . acetaminophen (TYLENOL) 325 MG tablet Take 2 tablets (650 mg total) by mouth every 6 (six) hours as needed for mild pain or fever.  . ALPRAZolam (XANAX) 0.5 MG tablet Take 1 tablet (0.5 mg total) by mouth 2 (two) times daily as needed for anxiety.  Marland Kitchen. aspirin EC 81 MG EC tablet Take 1 tablet (81 mg total) by mouth daily.  . clopidogrel (PLAVIX) 75 MG tablet Take 75 mg by mouth daily.   Marland Kitchen. doxycycline (VIBRA-TABS) 100 MG tablet Take 1 tablet (100 mg total) by mouth 2 (two) times daily. X 4 more days  . estrogen, conjugated,-medroxyprogesterone (PREMPRO) 0.625-2.5 MG tablet Take 1 tablet by  mouth every other day.  . furosemide (LASIX) 40 MG tablet Take 40 mg by mouth daily.  Marland Kitchen. levalbuterol (XOPENEX HFA) 45 MCG/ACT inhaler 2 puff inh every  4 to 6 hrs prn for sob and wheezing  . levothyroxine (SYNTHROID) 88 MCG tablet Take 88 mcg by mouth daily.  . mometasone-formoterol (DULERA) 100-5 MCG/ACT AERO Inhale 2 puffs into the lungs 2 (two) times daily.  . montelukast (SINGULAIR) 10 MG tablet Take 10 mg by mouth daily.  Marland Kitchen. OLANZapine zydis (ZYPREXA) 5 MG disintegrating tablet Take 1 tablet (5 mg total) by mouth at bedtime.  Marland Kitchen. omeprazole (PRILOSEC) 20 MG capsule Take 20 mg by mouth daily.  . ondansetron (ZOFRAN) 4 MG tablet Take 4 mg by mouth as needed for nausea or vomiting.  . roflumilast (DALIRESP) 500 MCG TABS tablet Take 500 mcg by mouth daily.  . rosuvastatin (CRESTOR) 20 MG tablet Take 1 tablet (20 mg total) by mouth daily at 6 PM.  . simvastatin (ZOCOR) 20 MG tablet Take 20 mg by mouth daily.  Marland Kitchen. tiotropium (SPIRIVA HANDIHALER) 18 MCG inhalation capsule Place 18 mcg into inhaler and inhale daily.  Marland Kitchen. venlafaxine (EFFEXOR) 75 MG tablet Take 75 mg by mouth daily.   No facility-administered encounter medications on file as of 05/21/2017.     Surgical History: Past Surgical History:  Procedure Laterality Date  . CARDIAC CATHETERIZATION     "many yrs ago"  . CATARACT EXTRACTION W/PHACO Left 01/17/2015   Procedure: CATARACT EXTRACTION PHACO AND INTRAOCULAR LENS PLACEMENT (IOC);  Surgeon: Sherald HessAnita Prakash Vin-Parikh, MD;  [facility];  Service: Ophthalmology;  Laterality: Left;  . CERVICAL FUSION  1973, 1999   C4-5-6, C4-5  . DILATION AND CURETTAGE OF UTERUS  1999  . TONSILLECTOMY  1956    Medical History: Past Medical History:  Diagnosis Date  . Anemia    past hx  . Anxiety   . Asthma   . COPD (chronic obstructive pulmonary disease) (HCC)   . Hypertension   . Hypothyroidism   . Neck stiffness    limited movement s/p fusion c4-c5, c5-c6  . Shortness of  breath dyspnea    exertion  . TIA (transient ischemic attack)    vision issues    Family History: Family History  Problem Relation Age of Onset  . Stroke Mother   . CAD Mother   . CAD Father   . Alcohol abuse Brother     Social History: Social History   Socioeconomic History  . Marital status: Married    Spouse name: Not on file  . Number of children: Not on file  . Years of education: Not on file  . Highest education level: Not on file  Social Needs  . Financial resource strain: Not on file  . Food insecurity - worry: Not on file  . Food insecurity - inability: Not on file  . Transportation needs - medical: Not on file  . Transportation needs - non-medical: Not on file  Occupational History  . Not on file  Tobacco Use  . Smoking status: Light Tobacco Smoker    Years: 50.00    Types: Cigarettes  . Smokeless tobacco: Never Used  . Tobacco comment: 0-2 cigs daily  Substance and Sexual Activity  . Alcohol use: Yes    Comment: occasionally - 1 drink/6 months  . Drug use: No  . Sexual activity: Not on file  Other Topics Concern  . Not on file  Social History Narrative  . Not on file    Vital Signs: Blood pressure (!) 151/87, pulse (!) 110, resp. rate 16, height 5\' 2"  (1.575 m), weight 125 lb (56.7 kg), SpO2 92 %.  Examination: General Appearance: The patient is well-developed, well-nourished, and in no distress. Skin: Gross inspection of skin unremarkable. Head: normocephalic, no gross deformities. Eyes: no gross deformities noted. ENT: ears appear grossly normal no exudates. Neck: Supple. No thyromegaly. No LAD. Respiratory: Good air entry no rhonchi noted. Cardiovascular: Normal S1 and S2 without murmur or rub. Extremities: No cyanosis. pulses are equal. Neurologic: Alert and oriented. No involuntary movements.  LABS: No results found for this or any previous visit (from the past 2160 hour(s)).  Radiology: Dg Chest 1 View  Result Date:  08/06/2016 CLINICAL DATA:  Speech difficulties. Confusion. History of asthma and COPD. EXAM: CHEST 1 VIEW COMPARISON:  03/31/2013. FINDINGS: Mediastinum and hilar structures are normal. Lungs are clear. No pleural effusion or pneumothorax. Heart size normal. Degenerative changes thoracic spine. IMPRESSION: No active disease. Electronically Signed   By: Maisie Fus  Register   On: 08/06/2016 14:53   Ct Head Wo Contrast  Result Date: 08/06/2016 CLINICAL DATA:  Altered mental status.  Confusion. EXAM: CT HEAD WITHOUT CONTRAST TECHNIQUE: Contiguous axial images were obtained from the base of the skull through the vertex without intravenous contrast. COMPARISON:  Head CT scan 08/05/2016. 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 Chronic microvascular ischemic change is noted. 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 No evidence of acute abnormality including hemorrhage, infarct, mass lesion, mass  effect, midline shift or abnormal extra-axial fluid collection. No hydrocephalus or pneumocephalus. Vascular: Atherosclerosis noted. Skull: Intact. Sinuses/Orbits: Status post left lens extraction. Other: None. IMPRESSION: No acute abnormality. 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 Atherosclerosis. Electronically Signed   By: Drusilla Kanner M.D.   On: 08/06/2016 15:17    No results found.  No results found.    Assessment and Plan: Patient Active Problem List   Diagnosis Date Noted  . History of depression 08/27/2016  . Altered mental status 08/08/2016  . CVA (cerebral vascular accident) (HCC) 08/06/2016  . Acute delirium 08/06/2016  . 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 Major depression 03/01/2015  . Generalized anxiety disorder 03/01/2015  . CVA (cerebral infarction) 10/27/2014  . COPD (chronic obstructive pulmonary disease) (HCC) 10/27/2014  . Chronic respiratory failure (HCC) 10/27/2014  .  Anxiety 10/27/2014  . Hypertension 10/27/2014    1. COPD she will continue with current inhalers regimen. Patient has not had a recent flare up noted no admssions 2. S/p CVA primarily affected her eyes states vision deteriorating 3. Chronic respiratory failure with hypoxia on oxygen therapy as prescribed 4. Smoker again told her she needs to stop smoking  Ready to quit: Not Answered Counseling given: Not Answered Comment: 0-2 cigs daily    General Counseling: I have discussed the findings of the evaluation and examination with Glender.  I have also discussed any further diagnostic evaluation thatmay be needed or ordered today. Dorann verbalizes understanding of the findings of todays visit. We also reviewed her medications today and discussed drug interactions and side effects including but not limited excessive drowsiness and altered mental states. We also discussed that there is always a risk not just to her but also people around her. she has been encouraged to call the office with any questions or concerns that should arise related to todays visit.    Time spent:  I have personally obtained a history, examined the patient, evaluated laboratory and imaging results, formulated the assessment and plan and placed orders.    Yevonne Pax, MD [facility] Pulmonary and Critical Care Sleep medicine

## 2017-05-21 NOTE — Patient Instructions (Signed)

## 2017-05-30 ENCOUNTER — Emergency Department: Payer: Medicare Other

## 2017-05-30 ENCOUNTER — Encounter: Payer: Self-pay | Admitting: Emergency Medicine

## 2017-05-30 ENCOUNTER — Inpatient Hospital Stay
Admission: EM | Admit: 2017-05-30 | Discharge: 2017-06-05 | DRG: 871 | Disposition: A | Discharge status: Hospice | Payer: Medicare Other | Attending: Internal Medicine | Admitting: Internal Medicine

## 2017-05-30 DIAGNOSIS — Z7951 Long term (current) use of inhaled steroids: Secondary | ICD-10-CM

## 2017-05-30 DIAGNOSIS — I10 Essential (primary) hypertension: Secondary | ICD-10-CM | POA: Diagnosis present

## 2017-05-30 DIAGNOSIS — Z981 Arthrodesis status: Secondary | ICD-10-CM

## 2017-05-30 DIAGNOSIS — Z79899 Other long term (current) drug therapy: Secondary | ICD-10-CM

## 2017-05-30 DIAGNOSIS — J181 Lobar pneumonia, unspecified organism: Secondary | ICD-10-CM | POA: Diagnosis present

## 2017-05-30 DIAGNOSIS — F17210 Nicotine dependence, cigarettes, uncomplicated: Secondary | ICD-10-CM | POA: Diagnosis present

## 2017-05-30 DIAGNOSIS — J441 Chronic obstructive pulmonary disease with (acute) exacerbation: Secondary | ICD-10-CM | POA: Diagnosis present

## 2017-05-30 DIAGNOSIS — Z8673 Personal history of transient ischemic attack (TIA), and cerebral infarction without residual deficits: Secondary | ICD-10-CM

## 2017-05-30 DIAGNOSIS — I471 Supraventricular tachycardia, unspecified: Secondary | ICD-10-CM

## 2017-05-30 DIAGNOSIS — Z887 Allergy status to serum and vaccine status: Secondary | ICD-10-CM

## 2017-05-30 DIAGNOSIS — Z7189 Other specified counseling: Secondary | ICD-10-CM

## 2017-05-30 DIAGNOSIS — L8990 Pressure ulcer of unspecified site, unspecified stage: Secondary | ICD-10-CM

## 2017-05-30 DIAGNOSIS — R627 Adult failure to thrive: Secondary | ICD-10-CM | POA: Diagnosis present

## 2017-05-30 DIAGNOSIS — Z515 Encounter for palliative care: Secondary | ICD-10-CM | POA: Diagnosis present

## 2017-05-30 DIAGNOSIS — J9622 Acute and chronic respiratory failure with hypercapnia: Secondary | ICD-10-CM | POA: Diagnosis present

## 2017-05-30 DIAGNOSIS — Z880 Allergy status to penicillin: Secondary | ICD-10-CM

## 2017-05-30 DIAGNOSIS — E874 Mixed disorder of acid-base balance: Secondary | ICD-10-CM | POA: Diagnosis present

## 2017-05-30 DIAGNOSIS — F419 Anxiety disorder, unspecified: Secondary | ICD-10-CM | POA: Diagnosis present

## 2017-05-30 DIAGNOSIS — L89899 Pressure ulcer of other site, unspecified stage: Secondary | ICD-10-CM | POA: Diagnosis present

## 2017-05-30 DIAGNOSIS — J440 Chronic obstructive pulmonary disease with acute lower respiratory infection: Secondary | ICD-10-CM | POA: Diagnosis present

## 2017-05-30 DIAGNOSIS — E039 Hypothyroidism, unspecified: Secondary | ICD-10-CM | POA: Diagnosis present

## 2017-05-30 DIAGNOSIS — R0902 Hypoxemia: Secondary | ICD-10-CM

## 2017-05-30 DIAGNOSIS — Z7982 Long term (current) use of aspirin: Secondary | ICD-10-CM

## 2017-05-30 DIAGNOSIS — Z66 Do not resuscitate: Secondary | ICD-10-CM | POA: Diagnosis present

## 2017-05-30 DIAGNOSIS — Z7902 Long term (current) use of antithrombotics/antiplatelets: Secondary | ICD-10-CM

## 2017-05-30 DIAGNOSIS — Z9981 Dependence on supplemental oxygen: Secondary | ICD-10-CM

## 2017-05-30 DIAGNOSIS — A419 Sepsis, unspecified organism: Secondary | ICD-10-CM | POA: Diagnosis present

## 2017-05-30 DIAGNOSIS — J9621 Acute and chronic respiratory failure with hypoxia: Secondary | ICD-10-CM | POA: Diagnosis present

## 2017-05-30 DIAGNOSIS — J9601 Acute respiratory failure with hypoxia: Secondary | ICD-10-CM | POA: Diagnosis present

## 2017-05-30 DIAGNOSIS — R402413 Glasgow coma scale score 13-15, at hospital admission: Secondary | ICD-10-CM | POA: Diagnosis present

## 2017-05-30 DIAGNOSIS — J984 Other disorders of lung: Secondary | ICD-10-CM | POA: Diagnosis present

## 2017-05-30 DIAGNOSIS — J189 Pneumonia, unspecified organism: Secondary | ICD-10-CM

## 2017-05-30 LAB — PROTIME-INR
INR: 0.98
PROTHROMBIN TIME: 12.9 s (ref 11.4–15.2)

## 2017-05-30 LAB — CBC WITH DIFFERENTIAL/PLATELET
BASOS ABS: 0 10*3/uL (ref 0–0.1)
Basophils Relative: 0 %
EOS ABS: 0 10*3/uL (ref 0–0.7)
EOS PCT: 0 %
HCT: 37.3 % (ref 35.0–47.0)
Hemoglobin: 11.5 g/dL — ABNORMAL LOW (ref 12.0–16.0)
Lymphocytes Relative: 4 %
Lymphs Abs: 0.3 10*3/uL — ABNORMAL LOW (ref 1.0–3.6)
MCH: 25.7 pg — ABNORMAL LOW (ref 26.0–34.0)
MCHC: 30.8 g/dL — AB (ref 32.0–36.0)
MCV: 83.5 fL (ref 80.0–100.0)
MONO ABS: 0.7 10*3/uL (ref 0.2–0.9)
MONOS PCT: 9 %
NEUTROS ABS: 6.8 10*3/uL — AB (ref 1.4–6.5)
Neutrophils Relative %: 87 %
PLATELETS: 158 10*3/uL (ref 150–440)
RBC: 4.47 MIL/uL (ref 3.80–5.20)
RDW: 16.6 % — ABNORMAL HIGH (ref 11.5–14.5)
WBC: 7.8 10*3/uL (ref 3.6–11.0)

## 2017-05-30 LAB — LACTIC ACID, PLASMA
LACTIC ACID, VENOUS: 0.9 mmol/L (ref 0.5–1.9)
LACTIC ACID, VENOUS: 0.8 mmol/L (ref 0.5–1.9)

## 2017-05-30 LAB — COMPREHENSIVE METABOLIC PANEL
ALT: 28 U/L (ref 14–54)
AST: 26 U/L (ref 15–41)
Albumin: 3.6 g/dL (ref 3.5–5.0)
Alkaline Phosphatase: 111 U/L (ref 38–126)
Anion gap: 11 (ref 5–15)
BILIRUBIN TOTAL: 0.7 mg/dL (ref 0.3–1.2)
BUN: 17 mg/dL (ref 6–20)
CO2: 40 mmol/L — ABNORMAL HIGH (ref 22–32)
CREATININE: 0.36 mg/dL — AB (ref 0.44–1.00)
Calcium: 9.8 mg/dL (ref 8.9–10.3)
Chloride: 94 mmol/L — ABNORMAL LOW (ref 101–111)
Glucose, Bld: 137 mg/dL — ABNORMAL HIGH (ref 65–99)
Potassium: 4.1 mmol/L (ref 3.5–5.1)
Sodium: 145 mmol/L (ref 135–145)
TOTAL PROTEIN: 7.4 g/dL (ref 6.5–8.1)

## 2017-05-30 LAB — BLOOD GAS, ARTERIAL
ACID-BASE EXCESS: 15.3 mmol/L — AB (ref 0.0–2.0)
BICARBONATE: 47.2 mmol/L — AB (ref 20.0–28.0)
Delivery systems: POSITIVE
Expiratory PAP: 6
FIO2: 1
Inspiratory PAP: 16
O2 SAT: 95.5 %
PATIENT TEMPERATURE: 37
PO2 ART: 93 mmHg (ref 83.0–108.0)
pCO2 arterial: 118 mmHg (ref 32.0–48.0)
pH, Arterial: 7.21 — ABNORMAL LOW (ref 7.350–7.450)

## 2017-05-30 LAB — TROPONIN I: Troponin I: <0.03 ng/mL (ref <0.03)

## 2017-05-30 LAB — MRSA PCR SCREENING: MRSA by PCR: NEGATIVE

## 2017-05-30 LAB — URINALYSIS, COMPLETE (UACMP) WITH MICROSCOPIC
BILIRUBIN URINE: NEGATIVE
Bacteria, UA: NONE SEEN
Glucose, UA: NEGATIVE mg/dL
Hgb urine dipstick: NEGATIVE
Ketones, ur: 20 mg/dL — AB
Leukocytes, UA: NEGATIVE
Nitrite: NEGATIVE
PH: 5 (ref 5.0–8.0)
Protein, ur: 100 mg/dL — AB
SPECIFIC GRAVITY, URINE: 1.031 — AB (ref 1.005–1.030)

## 2017-05-30 LAB — GLUCOSE, CAPILLARY: GLUCOSE-CAPILLARY: 102 mg/dL — AB (ref 65–99)

## 2017-05-30 LAB — INFLUENZA PANEL BY PCR (TYPE A & B)
INFLAPCR: NEGATIVE
Influenza B By PCR: NEGATIVE

## 2017-05-30 MED ORDER — MONTELUKAST SODIUM 10 MG PO TABS
10.0000 mg | ORAL_TABLET | Freq: Every day | ORAL | Status: DC
  Administered 2017-05-31 – 2017-06-03 (×4): 10 mg via ORAL
  Filled 2017-05-30 (×4): qty 1

## 2017-05-30 MED ORDER — DOCUSATE SODIUM 100 MG PO CAPS
100.0000 mg | ORAL_CAPSULE | Freq: Two times a day (BID) | ORAL | Status: DC
  Administered 2017-06-01 – 2017-06-04 (×7): 100 mg via ORAL
  Filled 2017-05-30 (×9): qty 1

## 2017-05-30 MED ORDER — IPRATROPIUM-ALBUTEROL 0.5-2.5 (3) MG/3ML IN SOLN
3.0000 mL | Freq: Once | RESPIRATORY_TRACT | Status: AC
  Administered 2017-05-30: 3 mL via RESPIRATORY_TRACT
  Filled 2017-05-30: qty 3

## 2017-05-30 MED ORDER — DOCUSATE SODIUM 100 MG PO CAPS: 100.0000 mg | ORAL_CAPSULE | Freq: Every day | ORAL | Status: DC

## 2017-05-30 MED ORDER — ADENOSINE 6 MG/2ML IV SOLN
6.0000 mg | Freq: Once | INTRAVENOUS | Status: AC
Start: 2017-05-30 — End: 2017-05-30
  Administered 2017-05-30: 6 mg via INTRAVENOUS

## 2017-05-30 MED ORDER — ACETAMINOPHEN 325 MG PO TABS: 650.0000 mg | ORAL_TABLET | Freq: Four times a day (QID) | ORAL | Status: DC | PRN

## 2017-05-30 MED ORDER — OLANZAPINE 5 MG PO TBDP
5.0000 mg | ORAL_TABLET | Freq: Every evening | ORAL | Status: DC | PRN
  Administered 2017-06-02 – 2017-06-03 (×2): 5 mg via ORAL
  Filled 2017-05-30 (×4): qty 1

## 2017-05-30 MED ORDER — ACETAMINOPHEN 10 MG/ML IV SOLN
1000.0000 mg | Freq: Four times a day (QID) | INTRAVENOUS | Status: DC | PRN
  Administered 2017-05-30: 1000 mg via INTRAVENOUS
  Filled 2017-05-30: qty 100

## 2017-05-30 MED ORDER — ONDANSETRON HCL 4 MG/2ML IJ SOLN: 4.0000 mg | Freq: Four times a day (QID) | INTRAMUSCULAR | Status: DC | PRN

## 2017-05-30 MED ORDER — DEXTROSE 5 % IV SOLN
2.0000 g | Freq: Once | INTRAVENOUS | Status: AC
  Administered 2017-05-30: 2 g via INTRAVENOUS
  Filled 2017-05-30: qty 2

## 2017-05-30 MED ORDER — BISACODYL 10 MG RE SUPP: 10.0000 mg | Freq: Every day | RECTAL | Status: DC | PRN

## 2017-05-30 MED ORDER — METHYLPREDNISOLONE SODIUM SUCC 40 MG IJ SOLR
40.0000 mg | Freq: Three times a day (TID) | INTRAMUSCULAR | Status: DC
  Administered 2017-05-30 – 2017-06-03 (×12): 40 mg via INTRAVENOUS
  Filled 2017-05-30 (×12): qty 1

## 2017-05-30 MED ORDER — DEXMEDETOMIDINE HCL IN NACL 400 MCG/100ML IV SOLN
0.4000 ug/kg/h | INTRAVENOUS | Status: DC
  Administered 2017-05-30 – 2017-05-31 (×2): 0.4 ug/kg/h via INTRAVENOUS
  Administered 2017-05-31: 0.5 ug/kg/h via INTRAVENOUS
  Filled 2017-05-30 (×2): qty 100

## 2017-05-30 MED ORDER — IPRATROPIUM-ALBUTEROL 0.5-2.5 (3) MG/3ML IN SOLN: 3.0000 mL | Freq: Four times a day (QID) | RESPIRATORY_TRACT | Status: DC | PRN

## 2017-05-30 MED ORDER — ZINC SULFATE 220 (50 ZN) MG PO CAPS
220.0000 mg | ORAL_CAPSULE | Freq: Every day | ORAL | Status: DC
  Administered 2017-05-31 – 2017-06-05 (×6): 220 mg via ORAL
  Filled 2017-05-30 (×7): qty 1

## 2017-05-30 MED ORDER — LEVOFLOXACIN IN D5W 750 MG/150ML IV SOLN
750.0000 mg | INTRAVENOUS | Status: AC
  Administered 2017-05-31 – 2017-06-03 (×4): 750 mg via INTRAVENOUS
  Filled 2017-05-30 (×4): qty 150

## 2017-05-30 MED ORDER — ACETAMINOPHEN 650 MG RE SUPP: 650.0000 mg | Freq: Four times a day (QID) | RECTAL | Status: DC | PRN

## 2017-05-30 MED ORDER — ADENOSINE 12 MG/4ML IV SOLN
INTRAVENOUS | Status: AC
  Filled 2017-05-30: qty 4

## 2017-05-30 MED ORDER — VITAMIN C 500 MG PO TABS
500.0000 mg | ORAL_TABLET | Freq: Every day | ORAL | Status: DC
Start: 2017-05-30 — End: 2017-06-05
  Administered 2017-06-01 – 2017-06-05 (×5): 500 mg via ORAL
  Filled 2017-05-30 (×7): qty 1

## 2017-05-30 MED ORDER — LEVOTHYROXINE SODIUM 88 MCG PO TABS
88.0000 ug | ORAL_TABLET | Freq: Every day | ORAL | Status: DC
  Administered 2017-05-31 – 2017-06-05 (×6): 88 ug via ORAL
  Filled 2017-05-30 (×7): qty 1

## 2017-05-30 MED ORDER — VANCOMYCIN HCL IN DEXTROSE 1-5 GM/200ML-% IV SOLN
1000.0000 mg | INTRAVENOUS | Status: DC
Start: 2017-05-30 — End: 2017-05-31
  Administered 2017-05-30: 1000 mg via INTRAVENOUS
  Filled 2017-05-30 (×2): qty 200

## 2017-05-30 MED ORDER — VITAMIN E 45 MG (100 UNIT) PO CAPS
200.0000 [IU] | ORAL_CAPSULE | Freq: Every day | ORAL | Status: DC
  Administered 2017-05-31 – 2017-06-05 (×6): 200 [IU] via ORAL
  Filled 2017-05-30 (×7): qty 2

## 2017-05-30 MED ORDER — SODIUM CHLORIDE 0.9 % IV BOLUS (SEPSIS)
1000.0000 mL | Freq: Once | INTRAVENOUS | Status: AC
  Administered 2017-05-30: 1000 mL via INTRAVENOUS

## 2017-05-30 MED ORDER — SODIUM CHLORIDE 0.9 % IV SOLN
Freq: Once | INTRAVENOUS | Status: AC
  Administered 2017-05-30: 15:00:00 via INTRAVENOUS

## 2017-05-30 MED ORDER — FUROSEMIDE 20 MG PO TABS: 20.0000 mg | ORAL_TABLET | Freq: Every day | ORAL | Status: DC | PRN

## 2017-05-30 MED ORDER — MOMETASONE FURO-FORMOTEROL FUM 100-5 MCG/ACT IN AERO
2.0000 | INHALATION_SPRAY | Freq: Two times a day (BID) | RESPIRATORY_TRACT | Status: DC
  Administered 2017-05-30 – 2017-06-05 (×12): 2 via RESPIRATORY_TRACT
  Filled 2017-05-30: qty 8.8

## 2017-05-30 MED ORDER — ENOXAPARIN SODIUM 40 MG/0.4ML ~~LOC~~ SOLN
40.0000 mg | SUBCUTANEOUS | Status: DC
  Administered 2017-05-30 – 2017-06-04 (×6): 40 mg via SUBCUTANEOUS
  Filled 2017-05-30 (×6): qty 0.4

## 2017-05-30 MED ORDER — CLOPIDOGREL BISULFATE 75 MG PO TABS
75.0000 mg | ORAL_TABLET | Freq: Every day | ORAL | Status: DC
  Administered 2017-05-31 – 2017-06-05 (×6): 75 mg via ORAL
  Filled 2017-05-30 (×6): qty 1

## 2017-05-30 MED ORDER — ALPRAZOLAM 0.5 MG PO TABS
0.5000 mg | ORAL_TABLET | Freq: Two times a day (BID) | ORAL | Status: DC | PRN
  Administered 2017-05-30 – 2017-06-02 (×7): 0.5 mg via ORAL
  Filled 2017-05-30 (×7): qty 1

## 2017-05-30 MED ORDER — LEVOFLOXACIN IN D5W 750 MG/150ML IV SOLN
750.0000 mg | Freq: Once | INTRAVENOUS | Status: AC
  Administered 2017-05-30: 750 mg via INTRAVENOUS
  Filled 2017-05-30: qty 150

## 2017-05-30 MED ORDER — SIMVASTATIN 20 MG PO TABS
40.0000 mg | ORAL_TABLET | Freq: Every day | ORAL | Status: DC
  Administered 2017-05-30 – 2017-06-01 (×3): 40 mg via ORAL
  Filled 2017-05-30 (×4): qty 2

## 2017-05-30 MED ORDER — ASPIRIN EC 325 MG PO TBEC
325.0000 mg | DELAYED_RELEASE_TABLET | Freq: Every day | ORAL | Status: DC
  Administered 2017-05-31 – 2017-06-05 (×6): 325 mg via ORAL
  Filled 2017-05-30 (×7): qty 1

## 2017-05-30 MED ORDER — LORATADINE 10 MG PO TABS
10.0000 mg | ORAL_TABLET | Freq: Every day | ORAL | Status: DC
  Administered 2017-05-31 – 2017-06-05 (×6): 10 mg via ORAL
  Filled 2017-05-30 (×7): qty 1

## 2017-05-30 MED ORDER — VENLAFAXINE HCL ER 75 MG PO CP24
150.0000 mg | ORAL_CAPSULE | Freq: Every day | ORAL | Status: DC
  Administered 2017-05-31 – 2017-06-05 (×6): 150 mg via ORAL
  Filled 2017-05-30 (×3): qty 2
  Filled 2017-05-30: qty 1
  Filled 2017-05-30 (×3): qty 2

## 2017-05-30 MED ORDER — ONDANSETRON HCL 4 MG PO TABS: 4.0000 mg | ORAL_TABLET | Freq: Four times a day (QID) | ORAL | Status: DC | PRN

## 2017-05-30 MED ORDER — SENNOSIDES-DOCUSATE SODIUM 8.6-50 MG PO TABS: 1.0000 | ORAL_TABLET | Freq: Every evening | ORAL | Status: DC | PRN

## 2017-05-30 MED ORDER — TIOTROPIUM BROMIDE MONOHYDRATE 18 MCG IN CAPS
18.0000 ug | ORAL_CAPSULE | Freq: Every day | RESPIRATORY_TRACT | Status: DC
  Administered 2017-05-31 – 2017-06-04 (×5): 18 ug via RESPIRATORY_TRACT
  Filled 2017-05-30: qty 5

## 2017-05-30 MED ORDER — VANCOMYCIN HCL IN DEXTROSE 1-5 GM/200ML-% IV SOLN
1000.0000 mg | Freq: Once | INTRAVENOUS | Status: AC
  Administered 2017-05-30: 1000 mg via INTRAVENOUS
  Filled 2017-05-30: qty 200

## 2017-05-30 MED ORDER — ADENOSINE 6 MG/2ML IV SOLN
INTRAVENOUS | Status: AC
  Administered 2017-05-30: 6 mg via INTRAVENOUS
  Filled 2017-05-30: qty 2

## 2017-05-30 MED ORDER — ROFLUMILAST 500 MCG PO TABS
500.0000 ug | ORAL_TABLET | Freq: Every day | ORAL | Status: DC
  Administered 2017-05-31 – 2017-06-05 (×6): 500 ug via ORAL
  Filled 2017-05-30 (×7): qty 1

## 2017-05-30 NOTE — ED Triage Notes (Signed)
Pt to ED via EMS from home with c/o resp distress. Pt was found 60% on RA by fire. Pt was on 4L, 90% per EMS, HR 180, pt alert and responding to MD at bedside. PT c/o CP. Hx of COPD. Per family strong odor urine

## 2017-05-30 NOTE — Consult Note (Addendum)
Reason for Consult: Respiratory failure Referring Physician: Hospitalist service  Shannon Wilkerson is an 72 y.o. female.  HPI: Shannon Wilkerson is a 72 year old female with a past medical history remarkable for hypertension, hypothyroidism, chronic anemia, anxiety disorder, severe COPD, prior history of cervical fusion C4-C5, C5-C6, patient is on chronic home oxygen therapy presented to the emergency department, brought in tonight by her husband with complaints of increasing shortness of breath and lethargy was found to have saturation in the 60s per EMS.  Patient was started on noninvasive ventilation, chest x-ray revealed right middle lobe pneumonia as patient had upper respiratory tract symptoms.  Patient presently on noninvasive ventilation, will wake up and communicate when agitated her.  Per report she had an episode of SVT which resolved with adenosine  Past Medical History:  Diagnosis Date  . Anemia    past hx  . Anxiety   . Asthma   . COPD (chronic obstructive pulmonary disease) ([place])   . Hypertension   . Hypothyroidism   . Neck stiffness    limited movement s/p fusion c4-c5, c5-c6  . Shortness of breath dyspnea    exertion  . TIA (transient ischemic attack)    vision issues    Past Surgical History:  Procedure Laterality Date  . CARDIAC CATHETERIZATION     "many yrs ago"  . CATARACT EXTRACTION W/PHACO Left 01/17/2015   Procedure: CATARACT EXTRACTION PHACO AND INTRAOCULAR LENS PLACEMENT (IOC);  Surgeon: Ronnell Freshwater, MD;  Location: [facility];  Service: Ophthalmology;  Laterality: Left;  . [place]   C4-5-6, C4-5  . DILATION AND CURETTAGE OF UTERUS  1999  . TONSILLECTOMY  1956    Family History  Problem Relation Age of Onset  . Stroke Mother   . CAD Mother   . CAD Father   . Alcohol abuse Brother     Social History:  reports that she has been smoking cigarettes.  She has smoked for the past 50.00 years. she has never used smokeless  tobacco. She reports that she drinks alcohol. She reports that she does not use drugs.  Allergies:  Allergies  Allergen Reactions  . Fluvirin [Flu Virus Vaccine] Diarrhea  . Penicillins Hives and Other (See Comments)    Has patient had a PCN reaction causing immediate rash, facial/tongue/throat swelling, SOB or lightheadedness with hypotension: No Has patient had a PCN reaction causing severe rash involving mucus membranes or skin necrosis: No Has patient had a PCN reaction that required hospitalization No Has patient had a PCN reaction occurring within the last 10 years: No If all of the above answers are "NO", then may proceed with Cephalosporin use.  . Tetanus Toxoid, Adsorbed Diarrhea  . Tetanus Toxoids Diarrhea    Medications: I have reviewed the patient's current medications.  Results for orders placed or performed during the hospital encounter of 05/30/17 (from the past 48 hour(s))  Influenza panel by PCR (type A & B)     Status: None   Collection Time: 05/30/17  7:56 AM  Result Value Ref Range   Influenza A By PCR NEGATIVE NEGATIVE   Influenza B By PCR NEGATIVE NEGATIVE    Comment: (NOTE) The Xpert Xpress Flu assay is intended as an aid in the diagnosis of  influenza and should not be used as a sole basis for treatment.  This  assay is FDA approved for nasopharyngeal swab specimens only. Nasal  washings and aspirates are unacceptable for Xpert Xpress Flu testing. Performed at  [facility], [address] [place], [place] [address]   Comprehensive metabolic panel     Status: Abnormal   Collection Time: 05/30/17  8:01 AM  Result Value Ref Range   Sodium 145 135 - 145 mmol/L   Potassium 4.1 3.5 - 5.1 mmol/L   Chloride 94 (L) 101 - 111 mmol/L   CO2 40 (H) 22 - 32 mmol/L   Glucose, Bld 137 (H) 65 - 99 mg/dL   BUN 17 6 - 20 mg/dL   Creatinine, Ser 0.36 (L) 0.44 - 1.00 mg/dL   Calcium 9.8 8.9 - 10.3 mg/dL   Total Protein 7.4 6.5 - 8.1 g/dL   Albumin 3.6 3.5 -  5.0 g/dL   AST 26 15 - 41 U/L   ALT 28 14 - 54 U/L   Alkaline Phosphatase 111 38 - 126 U/L   Total Bilirubin 0.7 0.3 - 1.2 mg/dL   GFR calc non Af Amer >60 >60 mL/min   GFR calc Af Amer >60 >60 mL/min    Comment: (NOTE) The eGFR has been calculated using the CKD EPI equation. This calculation has not been validated in all clinical situations. eGFR's persistently <60 mL/min signify possible Chronic Kidney Disease.    Anion gap 11 5 - 15    Comment: Performed at [facility], [address] [place], [place] [address]  Lactic acid, plasma     Status: None   Collection Time: 05/30/17  8:01 AM  Result Value Ref Range   Lactic Acid, Venous 0.9 0.5 - 1.9 mmol/L    Comment: Performed at [facility], [address] [place], [place] [address]  CBC with Differential     Status: Abnormal   Collection Time: 05/30/17  8:01 AM  Result Value Ref Range   WBC 7.8 3.6 - 11.0 K/uL   RBC 4.47 3.80 - 5.20 MIL/uL   Hemoglobin 11.5 (L) 12.0 - 16.0 g/dL   HCT 37.3 35.0 - 47.0 %   MCV 83.5 80.0 - 100.0 fL   MCH 25.7 (L) 26.0 - 34.0 pg   MCHC 30.8 (L) 32.0 - 36.0 g/dL   RDW 16.6 (H) 11.5 - 14.5 %   Platelets 158 150 - 440 K/uL   Neutrophils Relative % 87 %   Neutro Abs 6.8 (H) 1.4 - 6.5 K/uL   Lymphocytes Relative 4 %   Lymphs Abs 0.3 (L) 1.0 - 3.6 K/uL   Monocytes Relative 9 %   Monocytes Absolute 0.7 0.2 - 0.9 K/uL   Eosinophils Relative 0 %   Eosinophils Absolute 0.0 0 - 0.7 K/uL   Basophils Relative 0 %   Basophils Absolute 0.0 0 - 0.1 K/uL    Comment: Performed at [facility], [address] [place], [place] [address]  Protime-INR     Status: None   Collection Time: 05/30/17  8:01 AM  Result Value Ref Range   Prothrombin Time 12.9 11.4 - 15.2 seconds   INR 0.98     Comment: Performed at [facility], [address] [place], [place] [address]  Urinalysis, Complete w Microscopic     Status: Abnormal   Collection Time: 05/30/17  8:01 AM  Result  Value Ref Range   Color, Urine AMBER (A) YELLOW    Comment: BIOCHEMICALS MAY BE AFFECTED BY COLOR   APPearance HAZY (A) CLEAR   Specific Gravity, Urine 1.031 (H) 1.005 - 1.030   pH 5.0 5.0 - 8.0   Glucose, UA NEGATIVE NEGATIVE mg/dL  Hgb urine dipstick NEGATIVE NEGATIVE   Bilirubin Urine NEGATIVE NEGATIVE   Ketones, ur 20 (A) NEGATIVE mg/dL   Protein, ur 100 (A) NEGATIVE mg/dL   Nitrite NEGATIVE NEGATIVE   Leukocytes, UA NEGATIVE NEGATIVE   RBC / HPF 0-5 0 - 5 RBC/hpf   WBC, UA 0-5 0 - 5 WBC/hpf   Bacteria, UA NONE SEEN NONE SEEN   Squamous Epithelial / LPF 0-5 (A) NONE SEEN   Mucus PRESENT    Hyaline Casts, UA PRESENT     Comment: Performed at [facility], [address] [place], [place] [address]  Troponin I     Status: None   Collection Time: 05/30/17  8:01 AM  Result Value Ref Range   Troponin I <0.03 <0.03 ng/mL    Comment: Performed at [facility], [address] [place], [place] [address]  Blood gas, arterial     Status: Abnormal   Collection Time: 05/30/17  8:02 AM  Result Value Ref Range   FIO2 1.00    Delivery systems BILEVEL POSITIVE AIRWAY PRESSURE    Inspiratory PAP 16    Expiratory PAP 6    pH, Arterial 7.21 (L) 7.350 - 7.450   pCO2 arterial 118 (HH) 32.0 - 48.0 mmHg    Comment: CRITICAL RESULT CALLED TO, READ BACK BY AND VERIFIED WITH:  DR. PADUACHOWSKI @ 0845 ON 05/30/17 BY SNM    pO2, Arterial 93 83.0 - 108.0 mmHg   Bicarbonate 47.2 (H) 20.0 - 28.0 mmol/L   Acid-Base Excess 15.3 (H) 0.0 - 2.0 mmol/L   O2 Saturation 95.5 %   Patient temperature 37.0    Collection site LEFT BRACHIAL    Sample type ARTERIAL DRAW    Allens test (pass/fail) NOT APPLICABLE (A) PASS    Comment: Performed at [facility], [address] [place], [place] [address]  Lactic acid, plasma     Status: None   Collection Time: 05/30/17  9:47 AM  Result Value Ref Range   Lactic Acid, Venous 0.8 0.5 - 1.9 mmol/L    Comment: Performed at [facility], [address], [place],  [address]    Dg Chest Port 1 View  Result Date: 05/30/2017 CLINICAL DATA:  Respiratory distress EXAM: PORTABLE CHEST 1 VIEW COMPARISON:  08/06/2016 FINDINGS: Cardiac shadow is stable. Left lung is well aerated. Right lung demonstrates evidence of right middle lobe infiltrate without sizable effusion. No bony abnormality is noted. IMPRESSION: Right middle lobe infiltrate. Electronically Signed   By: Inez Catalina M.D.   On: 05/30/2017 08:57    ROS unable to be obtained at this time secondary to patient's medical status Blood pressure 103/69, pulse (!) 109, temperature 98.4 F (36.9 C), temperature source Axillary, resp. rate 13, weight 139 lb 11.2 oz (63.4 kg), SpO2 98 %. Physical Exam Patient is on noninvasive ventilation, is lethargic but arousable HEENT: Limited oral exam secondary to BiPAP, trachea is midline, no thyromegaly appreciated, accessory muscle utilization noted Cardiovascular: Regular rate and rhythm, distant heart sounds, underlying sinus mechanism Pulmonary: Markedly diminished breath sounds bilaterally with expiratory rhonchi right greater than left with bronchial breath sounds appreciated on the right Abdominal: Positive bowel sounds, soft exam Extremities: No clubbing cyanosis or edema noted Neurologic: Limited exam, appears to move all extremities we will need to reassess  Assessment/Plan: Respiratory failure requiring noninvasive ventilation presented to the emergency department K is a gone acute on chronic hypercapnic respiratory failure.  Arterial blood gas reveals PCO2 of 118 with  a pH of 7.21.  Patient's x-ray reveals a right middle lobe pneumonia with silhouette of the right heart border.  Would continue noninvasive ventilation and repeat arterial blood gas to make sure her gas is going in the right direction.  Agree with Solu-Medrol, albuterol, Atrovent and broad-spectrum antibiotics  Acid-base derangement.  Patient with  underlying metabolic alkalosis most likely secondary to compensation for chronic respiratory acidosis  Mild anemia.  No evidence of active bleeding   Delbra Zellars 05/30/2017, 12:21 PM

## 2017-05-30 NOTE — ED Notes (Signed)
RN unable to take report at this time 

## 2017-05-30 NOTE — Progress Notes (Signed)
Patients satruations are 96%, decreased fio2 to 36%.

## 2017-05-30 NOTE — Progress Notes (Signed)
CODE SEPSIS - PHARMACY COMMUNICATION  **Broad Spectrum Antibiotics should be administered within 1 hour of Sepsis diagnosis**  Time Code Sepsis Called/Page Received: 0809  Antibiotics Ordered: aztreonam/Levaquin/vancomycin  Time of 1st antibiotic administration: 0810  Additional action taken by pharmacy:   If necessary, Name of Provider/Nurse Contacted:     Valentina Guhristy, Zandyr Barnhill D ,PharmD Clinical Pharmacist  05/30/2017  8:20 AM

## 2017-05-30 NOTE — Progress Notes (Signed)
RT called to ER room 7 for Emergency Traffic. Patient assessed and placed on BiPap  Per MD request. Please see BIpap progress notes.

## 2017-05-30 NOTE — H&P (Signed)
[facility] NAME: Shannon Wilkerson    MR#:  [id number]  DATE OF BIRTH:  1945-12-05  DATE OF ADMISSION:  05/30/2017  PRIMARY CARE PHYSICIAN: Lenard Simmer, MD   REQUESTING/REFERRING PHYSICIAN: Dr Kerman Passey  CHIEF COMPLAINT:  Increasing shortness of breath and lethargic  HISTORY OF PRESENT ILLNESS:  Shannon Wilkerson  is a 72 y.o. female with a known history of chronic respiratory failure/COPD on chronic 3 L nasal cannula oxygen, hypertension, hypothyroidism comes to the emergency room accompanied by her husband with increasing shortness of breath breath and lethargy and was found to have sats down in the 40s-60s per EMS. Patient currently is on BiPAP sats are 99%. She is found to have right middle lobe pneumonia.  Patient had some URI symptoms few days ago according to the husband. She received IV Levaquin, aztreonam, vancomycin Patient is being admitted with sepsis secondary to pneumonia and acute on chronic respiratory failure secondary to COPD exacerbation. Patient also had SVT which resolved after dose of IV adenosine  PAST MEDICAL HISTORY:   Past Medical History:  Diagnosis Date  . Anemia    past hx  . Anxiety   . Asthma   . COPD (chronic obstructive pulmonary disease) ([facility])   . Hypertension   . Hypothyroidism   . Neck stiffness    limited movement s/p fusion c4-c5, c5-c6  . Shortness of breath dyspnea    exertion  . TIA (transient ischemic attack)    vision issues    PAST SURGICAL HISTOIRY:   Past Surgical History:  Procedure Laterality Date  . CARDIAC CATHETERIZATION     "many yrs ago"  . CATARACT EXTRACTION W/PHACO Left 01/17/2015   Procedure: CATARACT EXTRACTION PHACO AND INTRAOCULAR LENS PLACEMENT (IOC);  Surgeon: Ronnell Freshwater, MD;  Location: [facility];  Service: Ophthalmology;  Laterality: Left;  . [place]   C4-5-6, C4-5  . DILATION AND CURETTAGE OF UTERUS   1999  . TONSILLECTOMY  1956    SOCIAL HISTORY:   Social History   Tobacco Use  . Smoking status: Light Tobacco Smoker    Years: 50.00    Types: Cigarettes  . Smokeless tobacco: Never Used  . Tobacco comment: 0-2 cigs daily  Substance Use Topics  . Alcohol use: Yes    Comment: occasionally - 1 drink/6 months    FAMILY HISTORY:   Family History  Problem Relation Age of Onset  . Stroke Mother   . CAD Mother   . CAD Father   . Alcohol abuse Brother     DRUG ALLERGIES:   Allergies  Allergen Reactions  . Fluvirin [Flu Virus Vaccine] Diarrhea  . Penicillins Hives and Other (See Comments)    Has patient had a PCN reaction causing immediate rash, facial/tongue/throat swelling, SOB or lightheadedness with hypotension: No Has patient had a PCN reaction causing severe rash involving mucus membranes or skin necrosis: No Has patient had a PCN reaction that required hospitalization No Has patient had a PCN reaction occurring within the last 10 years: No If all of the above answers are "NO", then may proceed with Cephalosporin use.  . Tetanus Toxoid, Adsorbed Diarrhea  . Tetanus Toxoids Diarrhea    REVIEW OF SYSTEMS:  Review of Systems  Unable to perform ROS: Medical condition     MEDICATIONS AT HOME:   Prior to Admission medications   Medication Sig Start Date End Date Taking? Authorizing Provider  ALPRAZolam Duanne Moron)  0.5 MG tablet Take 1 tablet (0.5 mg total) by mouth 2 (two) times daily as needed for anxiety. 08/10/16  Yes Gladstone Lighter, MD  aspirin EC 81 MG EC tablet Take 1 tablet (81 mg total) by mouth daily. Patient taking differently: Take 325 mg by mouth daily.  08/11/16  Yes Gladstone Lighter, MD  clopidogrel (PLAVIX) 75 MG tablet Take 75 mg by mouth daily.    Yes [provider]  docusate sodium (COLACE) 100 MG capsule Take 1 capsule by mouth at bedtime.   Yes [provider]  furosemide (LASIX) 40 MG tablet Take 20 mg by mouth daily.    Yes  [provider]  levalbuterol (XOPENEX HFA) 45 MCG/ACT inhaler 2 puff inh every  4 to 6 hrs prn for sob and wheezing Patient taking differently: Inhale 2 puffs into the lungs every 6 (six) hours as needed.  05/09/17  Yes Allyne Gee, MD  levothyroxine (SYNTHROID) 88 MCG tablet Take 88 mcg by mouth daily.   Yes [provider]  loratadine (CLARITIN) 10 MG tablet Take 10 mg by mouth daily.   Yes [provider]  mometasone-formoterol (DULERA) 100-5 MCG/ACT AERO Inhale 2 puffs into the lungs 2 (two) times daily.   Yes [provider]  montelukast (SINGULAIR) 10 MG tablet Take 10 mg by mouth daily.   Yes [provider]  OLANZapine zydis (ZYPREXA) 5 MG disintegrating tablet Take 1 tablet (5 mg total) by mouth at bedtime. 08/10/16  Yes Gladstone Lighter, MD  OXYGEN Inhale 2.5 L into the lungs continuous.   Yes [provider]  roflumilast (DALIRESP) 500 MCG TABS tablet Take 500 mcg by mouth daily.   Yes [provider]  simvastatin (ZOCOR) 40 MG tablet Take 1 tablet by mouth at bedtime. 05/02/17  Yes [provider]  tiotropium (SPIRIVA HANDIHALER) 18 MCG inhalation capsule Place 18 mcg into inhaler and inhale daily.   Yes [provider]  venlafaxine (EFFEXOR) 75 MG tablet Take 150 mg by mouth daily.    Yes [provider]  vitamin C (ASCORBIC ACID) 500 MG tablet Take 1 tablet by mouth daily.   Yes [provider]  Vitamin D, Ergocalciferol, (DRISDOL) 50000 units CAPS capsule Take 1 capsule by mouth 2 (two) times a week. 05/02/17  Yes [provider]  vitamin E 200 UNIT capsule Take 1 capsule by mouth daily.   Yes [provider]  zinc gluconate 50 MG tablet Take 1 tablet by mouth daily.   Yes [provider]  acetaminophen (TYLENOL) 325 MG tablet Take 2 tablets (650 mg total) by mouth every 6 (six) hours as needed for mild pain or fever. Patient not taking: Reported on 05/30/2017  03/02/15   Nicholes Mango, MD  diphenhydramine-acetaminophen (TYLENOL PM) 25-500 MG TABS tablet Take 1 tablet by mouth as needed.    [provider]  ipratropium-albuterol (DUONEB) 0.5-2.5 (3) MG/3ML SOLN Take 3 mLs by nebulization every 6 (six) hours as needed. 05/02/17   [provider]  nystatin (MYCOSTATIN) 100000 UNIT/ML suspension Take 1 mL by mouth as needed.    [provider]      VITAL SIGNS:  Blood pressure 96/62, pulse (!) 106, temperature 98.4 F (36.9 C), temperature source Axillary, resp. rate 18, weight 63.4 kg (139 lb 11.2 oz), SpO2 100 %.  PHYSICAL EXAMINATION:  GENERAL:  72 y.o.-year-old patient lying in the bed with no acute distress.  Appears chronically ill EYES: Pupils equal, round, reactive to light  and accommodation. No scleral icterus. Extraocular muscles intact.  HEENT: Head atraumatic, normocephalic. Oropharynx and nasopharynx clear.  BiPAP NECK:  Supple, no jugular venous distention. No thyroid enlargement, no tenderness.  LUNGS: Coarse breath sounds bilaterally, no wheezing, rales,rhonchi or crepitation. No use of accessory muscles of respiration.  CARDIOVASCULAR: S1, S2 normal. No murmurs, rubs, or gallops.  Tachycardia  aBDOMEN: Soft, nontender, nondistended. Bowel sounds present. No organomegaly or mass.  EXTREMITIES: No pedal edema, cyanosis, or clubbing.  NEUROLOGIC: Cranial nerves II through XII are intact. Muscle strength 5/5 in all extremities. Sensation intact. Gait not checked.  PSYCHIATRIC: The patient is alert and oriented x 3.  SKIN: No obvious rash, lesion, or ulcer.   LABORATORY PANEL:   CBC Recent Labs  Lab 05/30/17 0801  WBC 7.8  HGB 11.5*  HCT 37.3  PLT 158   ------------------------------------------------------------------------------------------------------------------  Chemistries  Recent Labs  Lab 05/30/17 0801  NA 145  K 4.1  CL 94*  CO2 40*  GLUCOSE 137*  BUN 17  CREATININE 0.36*  CALCIUM 9.8   AST 26  ALT 28  ALKPHOS 111  BILITOT 0.7   ------------------------------------------------------------------------------------------------------------------  Cardiac Enzymes Recent Labs  Lab 05/30/17 0801  TROPONINI <0.03   ------------------------------------------------------------------------------------------------------------------  RADIOLOGY:  Dg Chest Port 1 View  Result Date: 05/30/2017 CLINICAL DATA:  Respiratory distress EXAM: PORTABLE CHEST 1 VIEW COMPARISON:  08/06/2016 FINDINGS: Cardiac shadow is stable. Left lung is well aerated. Right lung demonstrates evidence of right middle lobe infiltrate without sizable effusion. No bony abnormality is noted. IMPRESSION: Right middle lobe infiltrate. Electronically Signed   By: Inez Catalina M.D.   On: 05/30/2017 08:57    EKG:    IMPRESSION AND PLAN:   Shannon Wilkerson is a 72 y.o. female with a past medical history of COPD on 3 L of oxygen, asthma, hypertension, CVA, presents to the emergency department for difficulty breathing.  According to EMS they were called by the patient's husband for difficulty breathing.  Patient lives at home, wears 3 L of oxygen for COPD.  EMS states initial O2 saturation on nasal cannula oxygen in the 60s  1.  Acute on chronic hypoxic/hypercarbic respiratory failure secondary to COPD exacerbation in the setting of right sided pneumonia -Currently on BiPAP -Discussed with ICU attending Dr. Jefferson Fuel -IV Solu-Medrol, inhalers, nebulized -IV antibiotic with Levaquin and vancomycin -MRSA PCR screening -Follow blood cultures sputum culture  2.  Sepsis secondary to right-sided pneumonia -IV antibiotics as above -Follow blood cultures  3.  Transient SVT -Resolved after 1 dose of adenosine in the ER  4.  History of hypertension continue home meds when able to take oral  5.  DVT prophylaxis subcu Lovenox  Patient will be admitted in stepdown ICU.  Met and discussed with patient's husband. Patient  is critically ill at present According to the husband patient is a DNR   All the records are reviewed and case discussed with ED provider. Management plans discussed with thefamily and they are in agreement.  CODE STATUS: DNR  TOTAL Critical TIME TAKING CARE OF THIS PATIENT: *50* minutes.    Fritzi Mandes M.D on 05/30/2017 at 10:51 AM  Between 7am to 6pm - Pager - [phone]  After 6pm go to [email or web address] - password [email or web address] [facility]  [facility]  Office  [phone]  CC: Primary care physician; Lenard Simmer, MD

## 2017-05-30 NOTE — ED Notes (Signed)
RT at bedside, pt on Bipap

## 2017-05-30 NOTE — ED Notes (Signed)
ICU unable to take report at this time.

## 2017-05-30 NOTE — ED Provider Notes (Signed)
[facility] Emergency Department Provider Note  Time seen: 8:05 AM  I have reviewed the triage vital signs and the nursing notes.   HISTORY  Chief Complaint Respiratory Distress    HPI Shannon Wilkerson is a 72 y.o. female with a past medical history of COPD on 3 L of oxygen, asthma, hypertension, CVA, presents to the emergency department for difficulty breathing.  According to EMS they were called by the patient's husband for difficulty breathing.  Patient lives at home, wears 3 L of oxygen for COPD.  EMS states initial O2 saturation on nasal cannula oxygen in the 60s.  Upon arrival to the emergency department patient is on 4 L nasal cannula by EMS, with 78% saturation.  Placed on BiPAP.  Patient states pain in her chest.  Denies cough or congestion.  Patient has audible rhonchi.  Fever to 100.0 in the emergency department.  Patient very somnolent, will answer questions which appear to be accurate.   Past Medical History:  Diagnosis Date  . Anemia    past hx  . Anxiety   . Asthma   . COPD (chronic obstructive pulmonary disease) ([facility])   . Hypertension   . Hypothyroidism   . Neck stiffness    limited movement s/p fusion c4-c5, c5-c6  . Shortness of breath dyspnea    exertion  . TIA (transient ischemic attack)    vision issues    Patient Active Problem List   Diagnosis Date Noted  . History of depression 08/27/2016  . Altered mental status 08/08/2016  . CVA (cerebral vascular accident) ([facility]) 08/06/2016  . Acute delirium 08/06/2016  . 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 Major depression 03/01/2015  . Generalized anxiety disorder 03/01/2015  . CVA (cerebral infarction) 10/27/2014  . COPD (chronic obstructive pulmonary disease) ([facility]) 10/27/2014  . Chronic respiratory failure ([facility]) 10/27/2014  . Anxiety 10/27/2014  . Hypertension 10/27/2014    Past Surgical History:  Procedure Laterality Date  . CARDIAC CATHETERIZATION     "many yrs ago"  .  CATARACT EXTRACTION W/PHACO Left 01/17/2015   Procedure: CATARACT EXTRACTION PHACO AND INTRAOCULAR LENS PLACEMENT (IOC);  Surgeon: Sherald Hess, MD;  Location: [facility];  Service: Ophthalmology;  Laterality: Left;  . CERVICAL FUSION  1973, 1999   C4-5-6, C4-5  . DILATION AND CURETTAGE OF UTERUS  1999  . TONSILLECTOMY  1956    Prior to Admission medications   Medication Sig Start Date End Date Taking? Authorizing Provider  acetaminophen (TYLENOL) 325 MG tablet Take 2 tablets (650 mg total) by mouth every 6 (six) hours as needed for mild pain or fever. 03/02/15   Ramonita Lab, MD  ALPRAZolam Prudy Feeler) 0.5 MG tablet Take 1 tablet (0.5 mg total) by mouth 2 (two) times daily as needed for anxiety. 08/10/16   Enid Baas, MD  aspirin EC 81 MG EC tablet Take 1 tablet (81 mg total) by mouth daily. 08/11/16   Enid Baas, MD  clopidogrel (PLAVIX) 75 MG tablet Take 75 mg by mouth daily.     [provider]  doxycycline (VIBRA-TABS) 100 MG tablet Take 1 tablet (100 mg total) by mouth 2 (two) times daily. X 4 more days 08/10/16   Enid Baas, MD  estrogen, conjugated,-medroxyprogesterone (PREMPRO) 0.625-2.5 MG tablet Take 1 tablet by mouth every other day.    [provider]  furosemide (LASIX) 40 MG tablet Take 40 mg by mouth daily.    [provider]  levalbuterol Pauline Aus HFA) 45 MCG/ACT  inhaler 2 puff inh every  4 to 6 hrs prn for sob and wheezing 05/09/17   Yevonne Pax, MD  levothyroxine (SYNTHROID) 88 MCG tablet Take 88 mcg by mouth daily.    [provider]  mometasone-formoterol (DULERA) 100-5 MCG/ACT AERO Inhale 2 puffs into the lungs 2 (two) times daily.    [provider]  montelukast (SINGULAIR) 10 MG tablet Take 10 mg by mouth daily.    [provider]  OLANZapine zydis (ZYPREXA) 5 MG disintegrating tablet Take 1 tablet (5 mg total) by mouth at bedtime. 08/10/16   Enid Baas, MD  omeprazole  (PRILOSEC) 20 MG capsule Take 20 mg by mouth daily.    [provider]  ondansetron (ZOFRAN) 4 MG tablet Take 4 mg by mouth as needed for nausea or vomiting.    [provider]  roflumilast (DALIRESP) 500 MCG TABS tablet Take 500 mcg by mouth daily.    [provider]  rosuvastatin (CRESTOR) 20 MG tablet Take 1 tablet (20 mg total) by mouth daily at 6 PM. 08/10/16   Enid Baas, MD  simvastatin (ZOCOR) 20 MG tablet Take 20 mg by mouth daily.    [provider]  tiotropium (SPIRIVA HANDIHALER) 18 MCG inhalation capsule Place 18 mcg into inhaler and inhale daily.    [provider]  venlafaxine (EFFEXOR) 75 MG tablet Take 75 mg by mouth daily.    [provider]    Allergies  Allergen Reactions  . Fluvirin [Flu Virus Vaccine] Diarrhea  . Penicillins Hives and Other (See Comments)    Has patient had a PCN reaction causing immediate rash, facial/tongue/throat swelling, SOB or lightheadedness with hypotension: No Has patient had a PCN reaction causing severe rash involving mucus membranes or skin necrosis: No Has patient had a PCN reaction that required hospitalization No Has patient had a PCN reaction occurring within the last 10 years: No If all of the above answers are "NO", then may proceed with Cephalosporin use.  . Tetanus Toxoid, Adsorbed Diarrhea  . Tetanus Toxoids Diarrhea    Family History  Problem Relation Age of Onset  . Stroke Mother   . CAD Mother   . CAD Father   . Alcohol abuse Brother     Social History Social History   Tobacco Use  . Smoking status: Light Tobacco Smoker    Years: 50.00    Types: Cigarettes  . Smokeless tobacco: Never Used  . Tobacco comment: 0-2 cigs daily  Substance Use Topics  . Alcohol use: Yes    Comment: occasionally - 1 drink/6 months  . Drug use: No    Review of Systems Constitutional: No known fever at home, fever in the emergency department of 100.0 Eyes: Charged from left  eye. ENT: Negative for recent illness/congestion Cardiovascular: Positive for chest pain Respiratory: Positive for shortness of breath. Gastrointestinal: Negative for abdominal pain, vomiting Genitourinary: Negative for urinary compaints Musculoskeletal: Negative for musculoskeletal complaints Skin: Negative for skin complaints  Neurological: Negative for headache All other ROS negative  ____________________________________________   PHYSICAL EXAM:  VITAL SIGNS: ED Triage Vitals  Enc Vitals Group     BP 05/30/17 0758 124/89     Pulse Rate 05/30/17 0758 (!) 180     Resp 05/30/17 0758 (!) 30     Temp 05/30/17 0758 100 F (37.8 C)     Temp Source 05/30/17 0758 Oral     SpO2 05/30/17 0758 (!) 78 %     Weight 05/30/17 0759  139 lb 11.2 oz (63.4 kg)     Height --      Head Circumference --      Peak Flow --      Pain Score --      Pain Loc --      Pain Edu? --      Excl. in GC? --     Constitutional: Alert, somnolent appearing keeps eyes closed but does answer questions appropriately.  Follows basic commands. Eyes: Mild drainage from left eye, sclera normal, pupils normal ENT   Head: Normocephalic and atraumatic.   Nose: No congestion/rhinnorhea.   Mouth/Throat: Mucous membranes are moist. Cardiovascular: Regular rhythm, rate around 180 bpm. Respiratory: Patient has moderate tachypnea, appears to have rhonchi in both bases with decreased air movement bilaterally.  No obvious wheeze. Gastrointestinal: Soft and nontender. No distention.   Musculoskeletal: No significant pain or swelling Neurologic:  Normal speech and language. No gross focal neurologic deficits Skin:  Skin is warm, dry and intact.  Psychiatric: Mood and affect are normal.  ____________________________________________    EKG  EKG reviewed and interpreted by myself shows a supraventricular tachycardia 182 bpm with a narrow QRS, normal axis, normal intervals besides slight QTC prolongation,  nonspecific ST changes, no obvious ST elevation.  ____________________________________________    RADIOLOGY  Right middle lobe pneumonia  ____________________________________________   INITIAL IMPRESSION / ASSESSMENT AND PLAN / ED COURSE  Pertinent labs & imaging results that were available during my care of the patient were reviewed by me and considered in my medical decision making (see chart for details).  Patient presents to the emergency department hypoxic 78% currently on 4 L, placed on BiPAP.  Patient has rhonchi with diminished breath sounds bilaterally.  Patient presents emergency traffic for difficulty breathing.  Differential would include pneumonia, sepsis, ACS, atrial flutter, SVT, pulmonary edema.  Patient now on BiPAP, blood pressure reassuring 116/75.  Heart rate remains extremely tachycardic currently 175 bpm.  Patient does report chest pain.  I have initiated sepsis protocols, we will check labs, EKG, chest x-ray, initiate broad-spectrum antibiotics, fluids and continue to closely monitor.  ABG pending.   Patient's EKG shows a very fast rhythm, possibly SVT versus sinus tachycardia.  182 bpm on EKG, receiving IV fluids current heart rate on the monitor is around 172 bpm but fairly constant.  Hoping her heart rate will continue to respond to fluids, if not she might require medication to address the heart rate such as adenosine versus diltiazem.   Heart rate continues to be consistent 172 bpm.  Likely SVT.  Dose 6 mg of adenosine.  SVT was broken currently in sinus tachycardia around 110 bpm.  Patient continues to be on BiPAP but is responsive, answering questions.  Patient has a DO NOT RESUSCITATE order, but states per daughter that she would be intubated if we thought it would be a limited time on the ventilator, but would not want intubation and we thought it would be long-term life support.  ABG is resulted showing significant hypercapnia 118 PCO2 which could be  contributing to her somnolence.  Patient is on BiPAP we will repeat an ABG at 1 hour to ensure PCO2 is resolving.  X-ray consistent with right middle lobe pneumonia.  Labs are otherwise reassuring including a lactate of 0.9.  Continues to have a heart rate around 100-110 bpm after adenosine.  Patient remains on BiPAP with a saturation around 98%.    CRITICAL CARE Performed by: Minna AntisKevin Novaleigh Kohlman   Total critical  care time: 60 minutes  Critical care time was exclusive of separately billable procedures and treating other patients.  Critical care was necessary to treat or prevent imminent or life-threatening deterioration.  Critical care was time spent personally by me on the following activities: development of treatment plan with patient and/or surrogate as well as nursing, discussions with consultants, evaluation of patient's response to treatment, examination of patient, obtaining history from patient or surrogate, ordering and performing treatments and interventions, ordering and review of laboratory studies, ordering and review of radiographic studies, pulse oximetry and re-evaluation of patient's condition.  ____________________________________________   FINAL CLINICAL IMPRESSION(S) / ED DIAGNOSES  Sepsis Community-acquired pneumonia Supraventricular tachycardia Hypoxia    Minna Antis, MD 05/30/17 [phone]

## 2017-05-30 NOTE — ED Notes (Signed)
Family at bedside. 

## 2017-05-30 NOTE — ED Notes (Signed)
CODE  SEPSIS  CALLED  TO  CAREL[facility] 

## 2017-05-30 NOTE — ED Notes (Signed)
Pt placed on cardiac pads at this time  

## 2017-05-30 NOTE — Progress Notes (Signed)
Patients saturations are 99%, decreased to 65%.

## 2017-05-30 NOTE — Consult Note (Signed)
Pharmacy Antibiotic Note  Shannon Wilkerson is a 72 y.o. female admitted on 05/30/2017 with sepsis secondary to pneumonia.  Pharmacy has been consulted for vancomycin and levofloxacin dosing. Patient has already received 1000 mg IV vancomycin and 750 mg IV levofloxacin in the ED.  Plan: Ke: 0.054, T1/2: 12.8, Vd: 44  Start Vancomycin 1 g IV every 18 hours with 6 hour stacked dosing. Goal trough 15-20 mcg/mL. Calculated trough @ Css 16.7 mcg/mL. Trough levels ordered prior to fourth dose. Monitor renal function and adjust dose as needed.   Start levofloxacin 750 mg every 24 hours.  Weight: 139 lb 11.2 oz (63.4 kg)  Temp (24hrs), Avg:99.2 F (37.3 C), Min:98.4 F (36.9 C), Max:100 F (37.8 C)  Recent Labs  Lab 05/30/17 0801  WBC 7.8  CREATININE 0.36*  LATICACIDVEN 0.9    Estimated Creatinine Clearance: 57.2 mL/min (A) (by C-G formula based on SCr of 0.36 mg/dL (L)).    Allergies  Allergen Reactions  . Fluvirin [Flu Virus Vaccine] Diarrhea  . Penicillins Hives and Other (See Comments)    Has patient had a PCN reaction causing immediate rash, facial/tongue/throat swelling, SOB or lightheadedness with hypotension: No Has patient had a PCN reaction causing severe rash involving mucus membranes or skin necrosis: No Has patient had a PCN reaction that required hospitalization No Has patient had a PCN reaction occurring within the last 10 years: No If all of the above answers are "NO", then may proceed with Cephalosporin use.  . Tetanus Toxoid, Adsorbed Diarrhea  . Tetanus Toxoids Diarrhea    Antimicrobials this admission: 2/7 Vancomycin   2/7 Levofloxacin  Dose adjustments this admission:  Microbiology results: 2/7 MRSA PCR: Pending  Thank you for allowing pharmacy to be a part of this patient's care.  Marian SorrowZachary T Briget Shaheed, PharmD Candidate  05/30/2017 10:33 AM

## 2017-05-30 NOTE — ED Notes (Signed)
RN not available to take report at this time.  

## 2017-05-30 NOTE — ED Notes (Signed)
Pt changed of urine at this time , pt clean and dry

## 2017-05-31 ENCOUNTER — Other Ambulatory Visit: Payer: Self-pay

## 2017-05-31 DIAGNOSIS — A419 Sepsis, unspecified organism: Principal | ICD-10-CM

## 2017-05-31 DIAGNOSIS — Z7189 Other specified counseling: Secondary | ICD-10-CM

## 2017-05-31 DIAGNOSIS — R0902 Hypoxemia: Secondary | ICD-10-CM

## 2017-05-31 DIAGNOSIS — J181 Lobar pneumonia, unspecified organism: Secondary | ICD-10-CM

## 2017-05-31 DIAGNOSIS — Z515 Encounter for palliative care: Secondary | ICD-10-CM

## 2017-05-31 DIAGNOSIS — L8990 Pressure ulcer of unspecified site, unspecified stage: Secondary | ICD-10-CM

## 2017-05-31 LAB — CBC
HCT: 31.9 % — ABNORMAL LOW (ref 35.0–47.0)
Hemoglobin: 9.8 g/dL — ABNORMAL LOW (ref 12.0–16.0)
MCH: 25.7 pg — ABNORMAL LOW (ref 26.0–34.0)
MCHC: 30.7 g/dL — ABNORMAL LOW (ref 32.0–36.0)
MCV: 83.7 fL (ref 80.0–100.0)
PLATELETS: 131 10*3/uL — AB (ref 150–440)
RBC: 3.81 MIL/uL (ref 3.80–5.20)
RDW: 16.3 % — ABNORMAL HIGH (ref 11.5–14.5)
WBC: 6 10*3/uL (ref 3.6–11.0)

## 2017-05-31 LAB — BASIC METABOLIC PANEL
Anion gap: 9 (ref 5–15)
BUN: 22 mg/dL — ABNORMAL HIGH (ref 6–20)
CALCIUM: 9 mg/dL (ref 8.9–10.3)
CHLORIDE: 99 mmol/L — AB (ref 101–111)
CO2: 37 mmol/L — ABNORMAL HIGH (ref 22–32)
Creatinine, Ser: 0.62 mg/dL (ref 0.44–1.00)
Glucose, Bld: 122 mg/dL — ABNORMAL HIGH (ref 65–99)
Potassium: 4.9 mmol/L (ref 3.5–5.1)
SODIUM: 145 mmol/L (ref 135–145)

## 2017-05-31 LAB — URINE CULTURE: Culture: NO GROWTH

## 2017-05-31 MED ORDER — ALPRAZOLAM 0.25 MG PO TABS
0.2500 mg | ORAL_TABLET | Freq: Once | ORAL | Status: AC
  Administered 2017-05-31: 0.25 mg via ORAL
  Filled 2017-05-31: qty 1

## 2017-05-31 NOTE — Consult Note (Signed)
Consultation Note Date: 05/31/2017   Patient Name: Shannon Wilkerson  DOB: 1946/01/24  MRN: [id number]  Age / Sex: 72 y.o., female  PCP: Alan Mulder, MD Referring Physician: Enedina Finner, MD  Reason for Consultation: Establishing goals of care  HPI/Patient Profile: Shannon Wilkerson  is a 72 y.o. female with a known history of chronic respiratory failure/COPD on chronic 3 L nasal cannula oxygen, hypertension, hypothyroidism comes to the emergency room accompanied by her husband with increasing shortness of breath breath and lethargy and was found to have sats down in the 40s-60s per EMS.    Clinical Assessment and Goals of Care: Patient is sitting up in bed pursed lip breathing.  After introducing myself, upon sitting down to initiate conversation, she asked if I was the person to talk about getting her hospice. She states she would like to be made comfortable and states "I would rather be dead than live like this". 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   She states 2 years ago she had eye stokes and things changed since then. She lost her independence and began spending much of her time around the home. She states up until a week ago, she was able to wash dishes and feed the cat, but had to do things slowly, and spent most of her time in the chair or sitting at the kitchen table. This was not an acceptable quality of life for her. For the last week her respiratory status and functional status has further declined.     She and her husband state they believe they would like to proceed with going home with hospice however they would like their family to come tonight to discuss this plan first. Mr. Golonka called his daughter in law and requested I speak with her. Plan discussed and questions answered. She states she will come to the hospital  tonight to speak with them.     SUMMARY OF RECOMMENDATIONS    Family to discuss discharge home with hospice tonight.     Code Status/Advance Care Planning:  DNR/DNI    Symptom Management:   Per primary team  Palliative Prophylaxis:   Oral Care   Prognosis:   Unable to determine  Discharge Planning: To Be Determined      Primary Diagnoses: Present on Admission: . Acute respiratory failure with hypoxemia (HCC)   I have reviewed the medical record, interviewed the patient and family, and examined the patient. The following aspects are pertinent.  Past Medical History:  Diagnosis Date  . Anemia    past hx  . Anxiety   . Asthma   . COPD (chronic obstructive pulmonary disease) (HCC)   . Hypertension   . Hypothyroidism   . Neck stiffness    limited movement s/p fusion c4-c5, c5-c6  . Shortness of breath dyspnea    exertion  . TIA (transient ischemic attack)    vision issues   Social History   Socioeconomic  History  . Marital status: Married    Spouse name: None  . Number of children: None  . Years of education: None  . Highest education level: None  Social Needs  . Financial resource strain: None  . Food insecurity - worry: None  . Food insecurity - inability: None  . Transportation needs - medical: None  . Transportation needs - non-medical: None  Occupational History  . None  Tobacco Use  . Smoking status: Light Tobacco Smoker    Years: 50.00    Types: Cigarettes  . Smokeless tobacco: Never Used  . Tobacco comment: 0-2 cigs daily  Substance and Sexual Activity  . Alcohol use: Yes    Comment: occasionally - 1 drink/6 months  . Drug use: No  . Sexual activity: None  Other Topics Concern  . None  Social History Narrative  . None   Family History  Problem Relation Age of Onset  . Stroke Mother   . CAD Mother   . CAD Father   . Alcohol abuse Brother    Scheduled Meds: . aspirin EC  325 mg Oral Daily  . clopidogrel  75 mg Oral Daily   . docusate sodium  100 mg Oral BID  . enoxaparin (LOVENOX) injection  40 mg Subcutaneous Q24H  . levothyroxine  88 mcg Oral Daily  . loratadine  10 mg Oral Daily  . methylPREDNISolone (SOLU-MEDROL) injection  40 mg Intravenous Q8H  . mometasone-formoterol  2 puff Inhalation BID  . montelukast  10 mg Oral Daily  . roflumilast  500 mcg Oral Daily  . simvastatin  40 mg Oral QHS  . tiotropium  18 mcg Inhalation Daily  . venlafaxine XR  150 mg Oral Daily  . vitamin C  500 mg Oral Daily  . vitamin E  200 Units Oral Daily  . zinc sulfate  220 mg Oral Daily   Continuous Infusions: . dexmedetomidine (PRECEDEX) IV infusion 0.4 mcg/kg/hr (05/31/17 0840)  . levofloxacin (LEVAQUIN) IV Stopped (05/31/17 1015)   PRN Meds:.acetaminophen **OR** acetaminophen, ALPRAZolam, bisacodyl, furosemide, ipratropium-albuterol, OLANZapine zydis, ondansetron **OR** ondansetron (ZOFRAN) IV, senna-docusate Medications Prior to Admission:  Prior to Admission medications   Medication Sig Start Date End Date Taking? Authorizing Provider  ALPRAZolam Prudy Feeler) 0.5 MG tablet Take 1 tablet (0.5 mg total) by mouth 2 (two) times daily as needed for anxiety. 08/10/16  Yes Enid Baas, MD  aspirin EC 81 MG EC tablet Take 1 tablet (81 mg total) by mouth daily. Patient taking differently: Take 325 mg by mouth daily.  08/11/16  Yes Enid Baas, MD  clopidogrel (PLAVIX) 75 MG tablet Take 75 mg by mouth daily.    Yes [provider]  docusate sodium (COLACE) 100 MG capsule Take 1 capsule by mouth at bedtime.   Yes [provider]  furosemide (LASIX) 40 MG tablet Take 20 mg by mouth daily.    Yes [provider]  levalbuterol (XOPENEX HFA) 45 MCG/ACT inhaler 2 puff inh every  4 to 6 hrs prn for sob and wheezing Patient taking differently: Inhale 2 puffs into the lungs every 6 (six) hours as needed.  05/09/17  Yes Yevonne Pax, MD  levothyroxine (SYNTHROID) 88 MCG tablet Take 88 mcg by mouth  daily.   Yes [provider]  loratadine (CLARITIN) 10 MG tablet Take 10 mg by mouth daily.   Yes [provider]  mometasone-formoterol (DULERA) 100-5 MCG/ACT AERO Inhale 2 puffs into the lungs 2 (two) times daily.   Yes [provider]  montelukast (SINGULAIR) 10 MG tablet Take 10 mg by mouth daily.   Yes [provider]  OLANZapine zydis (ZYPREXA) 5 MG disintegrating tablet Take 1 tablet (5 mg total) by mouth at bedtime. 08/10/16  Yes Enid BaasKalisetti, Radhika, MD  OXYGEN Inhale 2.5 L into the lungs continuous.   Yes [provider]  roflumilast (DALIRESP) 500 MCG TABS tablet Take 500 mcg by mouth daily.   Yes [provider]  simvastatin (ZOCOR) 40 MG tablet Take 1 tablet by mouth at bedtime. 05/02/17  Yes [provider]  tiotropium (SPIRIVA HANDIHALER) 18 MCG inhalation capsule Place 18 mcg into inhaler and inhale daily.   Yes [provider]  venlafaxine (EFFEXOR) 75 MG tablet Take 150 mg by mouth daily.    Yes [provider]  vitamin C (ASCORBIC ACID) 500 MG tablet Take 1 tablet by mouth daily.   Yes [provider]  Vitamin D, Ergocalciferol, (DRISDOL) 50000 units CAPS capsule Take 1 capsule by mouth 2 (two) times a week. 05/02/17  Yes [provider]  vitamin E 200 UNIT capsule Take 1 capsule by mouth daily.   Yes [provider]  zinc gluconate 50 MG tablet Take 1 tablet by mouth daily.   Yes [provider]  acetaminophen (TYLENOL) 325 MG tablet Take 2 tablets (650 mg total) by mouth every 6 (six) hours as needed for mild pain or fever. Patient not taking: Reported on 05/30/2017 03/02/15   Ramonita LabGouru, Aruna, MD  diphenhydramine-acetaminophen (TYLENOL PM) 25-500 MG TABS tablet Take 1 tablet by mouth as needed.    [provider]  ipratropium-albuterol (DUONEB) 0.5-2.5 (3) MG/3ML SOLN Take 3 mLs by nebulization every 6 (six) hours as needed. 05/02/17   [provider]    nystatin (MYCOSTATIN) 100000 UNIT/ML suspension Take 1 mL by mouth as needed.    [provider]   Allergies  Allergen Reactions  . Fluvirin [Flu Virus Vaccine] Diarrhea  . Penicillins Hives and Other (See Comments)    Has patient had a PCN reaction causing immediate rash, facial/tongue/throat swelling, SOB or lightheadedness with hypotension: No Has patient had a PCN reaction causing severe rash involving mucus membranes or skin necrosis: No Has patient had a PCN reaction that required hospitalization No Has patient had a PCN reaction occurring within the last 10 years: No If all of the above answers are "NO", then may proceed with Cephalosporin use.  . Tetanus Toxoid, Adsorbed Diarrhea  . Tetanus Toxoids Diarrhea   Review of Systems  Respiratory: Positive for shortness of breath.     Physical Exam  Constitutional:  Thin and frail  Pulmonary/Chest:  Pursed lip breathing  Neurological: She is alert.  Skin: Skin is warm and dry.    Vital Signs: BP (!) 148/98   Pulse (!) 103   Temp 98 F (36.7 C) (Axillary)   Resp 18   Ht 5\' 4"  (1.626 m)   Wt 59.5 kg (131 lb 2.8 oz)   SpO2 93%   BMI 22.52 kg/m  Pain Assessment: No/denies pain POSS *See Group Information*: 1-Acceptable,Awake and alert Pain Score: 7    SpO2: SpO2: 93 % O2 Device:SpO2: 93 % O2 Flow Rate: .O2 Flow Rate (L/min): 5 L/min  IO: Intake/output summary:   Intake/Output Summary (Last 24 hours) at 05/31/2017 1443 Last data filed at 05/31/2017 0500 Gross per 24 hour  Intake 248.07 ml  Output 200 ml  Net 48.07 ml    LBM:   Baseline Weight: Weight: 63.4  kg (139 lb 11.2 oz) Most recent weight: Weight: 59.5 kg (131 lb 2.8 oz)     Palliative Assessment/Data: 30%     Time In: 12:45 Time Out: 2:45 Time Total: 120 minutes Greater than 50%  of this time was spent counseling and coordinating care related to the above assessment and plan.  Signed by: Morton Stall, NP   Please contact Palliative  Medicine Team phone at [phone] for questions and concerns.  For individual provider: See Loretha Stapler

## 2017-05-31 NOTE — Care Management Note (Addendum)
Case Management Note  Patient Details  Name: Shannon Wilkerson MRN: [id number] Date of Birth: 09/29/1945  Subjective/Objective:                 Admitted to icu stepdown due to need for continuous bipap.  Chronic home oxygen with advanced.  Patient  lives with her husband. Local pharmacy [facility] in [place]. End stage copd. Palliative care consult for goals of care   Action/Plan:  If able to discharge directly home, may benefit from home health with a COPD protocol and outpatient palliative unless she is found to be hospice appropriate and chooses that option.  Expected Discharge Date:                  Expected Discharge Plan:     In-House Referral:     Discharge planning Services     Post Acute Care Choice:    Choice offered to:     DME Arranged:    DME Agency:     HH Arranged:    HH Agency:     Status of Service:     If discussed at [employer] Meetings, dates discussed:    Additional Comments:  Eber HongGreene, Daley Gosse R, RN 05/31/2017, 8:49 AM

## 2017-05-31 NOTE — Progress Notes (Signed)
Shannon Wilkerson at [place] NAME: Shannon Wilkerson    MR#:  [id number]  DATE OF BIRTH:  Nov 13, 1945  SUBJECTIVE:  Pt very fatigued and tired. "xpressing her wishes not to live like this."  REVIEW OF SYSTEMS:   Review of Systems  Constitutional: Positive for malaise/fatigue. Negative for chills, fever and weight loss.  HENT: Negative for ear discharge, ear pain and nosebleeds.   Eyes: Negative for blurred vision, pain and discharge.  Respiratory: Positive for cough, sputum production and shortness of breath. Negative for wheezing and stridor.   Cardiovascular: Negative for chest pain, palpitations, orthopnea and PND.  Gastrointestinal: Negative for abdominal pain, diarrhea, nausea and vomiting.  Genitourinary: Negative for frequency and urgency.  Musculoskeletal: Negative for back pain and joint pain.  Neurological: Positive for weakness. Negative for sensory change, speech change and focal weakness.  Psychiatric/Behavioral: Negative for depression and hallucinations. The patient is not nervous/anxious.    Tolerating Diet:some   DRUG ALLERGIES:   Allergies  Allergen Reactions  . Fluvirin [Flu Virus Vaccine] Diarrhea  . Penicillins Hives and Other (See Comments)    Has patient had a PCN reaction causing immediate rash, facial/tongue/throat swelling, SOB or lightheadedness with hypotension: No Has patient had a PCN reaction causing severe rash involving mucus membranes or skin necrosis: No Has patient had a PCN reaction that required hospitalization No Has patient had a PCN reaction occurring within the last 10 years: No If all of the above answers are "NO", then may proceed with Cephalosporin use.  . Tetanus Toxoid, Adsorbed Diarrhea  . Tetanus Toxoids Diarrhea    VITALS:  Blood pressure 127/71, pulse (!) 110, temperature 98.5 F (36.9 C), temperature source Oral, resp. rate 20, height _0  (1.626 m), weight 59.5 kg (131 lb 2.8 oz),  SpO2 93 %.  PHYSICAL EXAMINATION:   Physical Exam  GENERAL:  72 y.o.-year-old patient lying in the bed with no acute distress. Appears ill EYES: Pupils equal, round, reactive to light and accommodation. No scleral icterus. Extraocular muscles intact.  HEENT: Head atraumatic, normocephalic. Oropharynx and nasopharynx clear.  NECK:  Supple, no jugular venous distention. No thyroid enlargement, no tenderness.  LUNGS:coarse breath sounds bilaterally, no wheezing, ++rales, rhonchi. No use of accessory muscles of respiration.  CARDIOVASCULAR: S1, S2 normal. No murmurs, rubs, or gallops. tachycardia ABDOMEN: Soft, nontender, nondistended. Bowel sounds present. No organomegaly or mass.  EXTREMITIES: No cyanosis, clubbing or edema b/l.    NEUROLOGIC: Cranial nerves II through XII are intact. No focal Motor or sensory deficits b/l.   PSYCHIATRIC:  patient is alert and oriented x 3.  SKIN: No obvious rash, lesion, or ulcer.   LABORATORY PANEL:  CBC Recent Labs  Lab 05/31/17 0650  WBC 6.0  HGB 9.8*  HCT 31.9*  PLT 131*    Chemistries  Recent Labs  Lab 05/30/17 0801 05/31/17 0650  NA 145 145  K 4.1 4.9  CL 94* 99*  CO2 40* 37*  GLUCOSE 137* 122*  BUN 17 22*  CREATININE 0.36* 0.62  CALCIUM 9.8 9.0  AST 26  --   ALT 28  --   ALKPHOS 111  --   BILITOT 0.7  --    Cardiac Enzymes Recent Labs  Lab 05/30/17 0801  TROPONINI <0.03   RADIOLOGY:  Dg Chest Port 1 View  Result Date: 05/30/2017 CLINICAL DATA:  Respiratory distress EXAM: PORTABLE CHEST 1 VIEW COMPARISON:  08/06/2016 FINDINGS: Cardiac shadow is stable. Left lung is well aerated.  Right lung demonstrates evidence of right middle lobe infiltrate without sizable effusion. No bony abnormality is noted. IMPRESSION: Right middle lobe infiltrate. Electronically Signed   By: Inez Catalina M.D.   On: 05/30/2017 08:57   ASSESSMENT AND PLAN:  [name] a 72 y.o.femalewith a past medical history of COPD on 3 L of oxygen,  asthma, hypertension, CVA, presents to the emergency department for difficulty breathing. According to EMS they were called by the patient's husband for difficulty breathing. Patient lives at home, wears 3 L of oxygen for COPD. EMS states initial O2 saturation on nasal cannula oxygen in the 60s  1.  Acute on chronic hypoxic/hypercarbic respiratory failure secondary to COPD exacerbation in the setting of right sided pneumonia -Currently off BiPAP -Discussed with ICU attending Dr. Jefferson Fuel -IV Solu-Medrol, inhalers, nebulized -IV antibiotic with Levaquin -MRSA PCR screening -Follow blood cultures sputum culture  2.  Sepsis secondary to right-sided pneumonia -IV antibiotics as above -Follow blood cultures  3.  Transient SVT -Resolved after 1 dose of adenosine in the ER  4.  History of hypertension continue home meds when able to take oral  5.  DVT prophylaxis subcu Lovenox  Pt and husbancd met with Palliatve care NP to discuss goals of care---pt requesting to go home with hospice.awaiting to d/w other family members   Patient is critically ill at present According to the husband patient is a DNR    Case discussed with Care Management/Social Worker. Management plans discussed with the patient, family and they are in agreement.  CODE STATUS: DNR  DVT Prophylaxis: lovenox  TOTAL TIME TAKING CARE OF THIS PATIENT: *30* minutes.  >50% time spent on counselling and coordination of care  POSSIBLE D/C IN few DAYS, DEPENDING ON CLINICAL CONDITION.  Note: This dictation was prepared with Dragon dictation along with smaller phrase technology. Any transcriptional errors that result from this process are unintentional.  Fritzi Mandes M.D on 05/31/2017 at 6:43 PM  Between 7am to 6pm - Pager - [phone]  After 6pm go to [email or web address] - password [email or web address] [facility]  Office  [phone]  CC: Primary care physician; Lenard Simmer, MDPatient ID: Shannon Wilkerson, female   DOB: 24-Dec-1945, 72 y.o.   MRN: [id number]

## 2017-05-31 NOTE — Progress Notes (Addendum)
[facility]ess [facility]    SYNOPSIS   [name] is a 72 year old female with a past medical history remarkable for hypertension, hypothyroidism, chronic anemia, anxiety disorder, severe COPD, prior history of cervical fusion C4-C5, C5-C6, patient is on chronic home oxygen therapy presented to the emergency department, brought in tonight by her husband with complaints of increasing shortness of breath and lethargy was found to have saturation in the 60s per EMS.  Patient was started on noninvasive ventilation   ASSESSMENT/PLAN   Acute on chronic hypercapnic respiratory failure requiring noninvasive ventilation. Right middle lobe pneumonia noted Presently on Levaquin, Solu-Medrol, albuterol, Atrovent, vancomycin. Patient with end-stage lung disease we'll consult palliative care to help establish goals of care.  VENTILATOR SETTINGS: FiO2 (%):  [50 %-55 %] 50 %   Name: Shannon HartJoann S Knabe MRN: [id number] DOB: [birth year]    ADMISSION DATE:  05/30/2017  SUBJECTIVE:   Patient is presently on NIV, still complaining of shortness of breath, she is awake and communicates and easily arousable  VITAL SIGNS: Temp:  [97.2 F (36.2 C)-99.9 F (37.7 C)] 97.2 F (36.2 C) (02/08 0200) Pulse Rate:  [75-171] 80 (02/08 0600) Resp:  [13-22] 15 (02/08 0600) BP: (86-121)/(57-90) 109/63 (02/08 0600) SpO2:  [89 %-100 %] 97 % (02/08 0600) FiO2 (%):  [50 %-55 %] 50 % (02/08 0200) Weight:  [131 lb 2.8 oz (59.5 kg)] 131 lb 2.8 oz (59.5 kg) (02/07 1427)   PHYSICAL EXAMINATION: Physical Examination:   VS: BP 109/63   Pulse 80   Temp (!) 97.2 F (36.2 C) (Axillary)   Resp 15   Ht 5\' 4"  (1.626 m)   Wt 131 lb 2.8 oz (59.5 kg)   SpO2 97%   BMI 22.52 kg/m   General Appearance: On NIV in mild respiratory distress  Neuro:without focal findings, mental status normal. HEENT: Limited oral exam, trachea is midline, mild accessory muscle utilization Pulmonary: Markedly diminished breath  sounds with prolonged expiratory phase Cardiovascular tachycardia appreciated with distant heart sounds   Abdomen: Benign, Soft, non-tender. Skin:   warm, no rashes, no ecchymosis  Extremities: normal, no cyanosis, clubbing.    LABORATORY PANEL:   CBC Recent Labs  Lab 05/31/17 0650  WBC 6.0  HGB 9.8*  HCT 31.9*  PLT 131*    Chemistries  Recent Labs  Lab 05/30/17 0801 05/31/17 0650  NA 145 145  K 4.1 4.9  CL 94* 99*  CO2 40* 37*  GLUCOSE 137* 122*  BUN 17 22*  CREATININE 0.36* 0.62  CALCIUM 9.8 9.0  AST 26  --   ALT 28  --   ALKPHOS 111  --   BILITOT 0.7  --     Recent Labs  Lab 05/30/17 1425  GLUCAP 102*   Recent Labs  Lab 05/30/17 0802  PHART 7.21*  PCO2ART 118*  PO2ART 93   Recent Labs  Lab 05/30/17 0801  AST 26  ALT 28  ALKPHOS 111  BILITOT 0.7  ALBUMIN 3.6    Cardiac Enzymes Recent Labs  Lab 05/30/17 0801  TROPONINI <0.03    RADIOLOGY:  Dg Chest Port 1 View  Result Date: 05/30/2017 CLINICAL DATA:  Respiratory distress EXAM: PORTABLE CHEST 1 VIEW COMPARISON:  08/06/2016 FINDINGS: Cardiac shadow is stable. Left lung is well aerated. Right lung demonstrates evidence of right middle lobe infiltrate without sizable effusion. No bony abnormality is noted. IMPRESSION: Right middle lobe infiltrate. Electronically Signed   By: Alcide CleverMark  Lukens M.D.   On: 05/30/2017 08:57  Tora Kindred, DO  2/8/2019Patient ID: Shannon Wilkerson, female   DOB: 1945/12/05, 72 y.o.   MRN: [id number]

## 2017-05-31 NOTE — Progress Notes (Signed)
PT Cancellation Note  Patient Details Name: Shannon Wilkerson MRN: [id number] DOB: Mar 10, 1946   Cancelled Treatment:    Reason Eval/Treat Not Completed: (palliative consult, likely going home with hospice) Pt  on 6 liters O2 via nasal cannula, tearful.  She is not interested in working with PT and voiced that she does not see any point in working with PT and is afraid and PT won't help that.  Pt states that PT not come back and if she has a change in this thinking she will let the nurse know to get a new PT order.  Will sign off at this time.     Malachi ProGalen R Ritika Hellickson, DPT 05/31/2017, 1:18 PM

## 2017-05-31 NOTE — Progress Notes (Signed)
Pharmacy Antibiotic Note  Shannon Wilkerson is a 72 y.o. female admitted on 05/30/2017 with respiratory failure/COPD exacerbation.  Pharmacy has been consulted for levofloxacin dosing. Vancomycin discontinued during AM rounds   Plan: Per rounds levofloxacin 750mg  IV Q24hr for total of 5 days of therapy.   Will continue to monitor for IV to PO opportunities.   Height: 5\' 4"  (162.6 cm) Weight: 131 lb 2.8 oz (59.5 kg) IBW/kg (Calculated) : 54.7  Temp (24hrs), Avg:98.5 F (36.9 C), Min:97.2 F (36.2 C), Max:99.9 F (37.7 C)  Recent Labs  Lab 05/30/17 0801 05/30/17 0947 05/31/17 0650  WBC 7.8  --  6.0  CREATININE 0.36*  --  0.62  LATICACIDVEN 0.9 0.8  --     Estimated Creatinine Clearance: 56.5 mL/min (by C-G formula based on SCr of 0.62 mg/dL).    Allergies  Allergen Reactions  . Fluvirin [Flu Virus Vaccine] Diarrhea  . Penicillins Hives and Other (See Comments)    Has patient had a PCN reaction causing immediate rash, facial/tongue/throat swelling, SOB or lightheadedness with hypotension: No Has patient had a PCN reaction causing severe rash involving mucus membranes or skin necrosis: No Has patient had a PCN reaction that required hospitalization No Has patient had a PCN reaction occurring within the last 10 years: No If all of the above answers are "NO", then may proceed with Cephalosporin use.  . Tetanus Toxoid, Adsorbed Diarrhea  . Tetanus Toxoids Diarrhea    Antimicrobials this admission: Aztreonam x 1 2/7  Vancomycin  2/7 >> 2/7 Levofloxacin 2/7 >>   Dose adjustments this admission: N/A  Microbiology results: 2/7 BCx: no growth < 24 hours  2/7 UCx: no growth  2/7 MRSA PCR: negative   Thank you for allowing pharmacy to be a part of this patient's care.  Simpson,Michael L 05/31/2017 2:58 PM

## 2017-05-31 NOTE — Progress Notes (Signed)
Pt was very anxious overnight and stated "Just let me die". NP aware and at bedside. Pt started on a Precedex gtt.

## 2017-06-01 LAB — BASIC METABOLIC PANEL
Anion gap: 9 (ref 5–15)
BUN: 26 mg/dL — AB (ref 6–20)
CALCIUM: 9.3 mg/dL (ref 8.9–10.3)
CHLORIDE: 98 mmol/L — AB (ref 101–111)
CO2: 37 mmol/L — AB (ref 22–32)
CREATININE: 0.54 mg/dL (ref 0.44–1.00)
GFR calc non Af Amer: >60 mL/min (ref >60)
Glucose, Bld: 134 mg/dL — ABNORMAL HIGH (ref 65–99)
Potassium: 4.2 mmol/L (ref 3.5–5.1)
SODIUM: 144 mmol/L (ref 135–145)

## 2017-06-01 LAB — CBC WITH DIFFERENTIAL/PLATELET
BASOS PCT: 0 %
Basophils Absolute: 0 10*3/uL (ref 0–0.1)
EOS ABS: 0 10*3/uL (ref 0–0.7)
Eosinophils Relative: 0 %
HCT: 32.6 % — ABNORMAL LOW (ref 35.0–47.0)
HEMOGLOBIN: 9.9 g/dL — AB (ref 12.0–16.0)
LYMPHS ABS: 0.3 10*3/uL — AB (ref 1.0–3.6)
Lymphocytes Relative: 2 %
MCH: 25.1 pg — AB (ref 26.0–34.0)
MCHC: 30.4 g/dL — AB (ref 32.0–36.0)
MCV: 82.5 fL (ref 80.0–100.0)
MONO ABS: 0.6 10*3/uL (ref 0.2–0.9)
MONOS PCT: 4 %
Neutro Abs: 14.3 10*3/uL — ABNORMAL HIGH (ref 1.4–6.5)
Neutrophils Relative %: 94 %
Platelets: 183 10*3/uL (ref 150–440)
RBC: 3.95 MIL/uL (ref 3.80–5.20)
RDW: 16.6 % — AB (ref 11.5–14.5)
WBC: 15.2 10*3/uL — ABNORMAL HIGH (ref 3.6–11.0)

## 2017-06-01 LAB — MAGNESIUM: Magnesium: 1.9 mg/dL (ref 1.7–2.4)

## 2017-06-01 MED ORDER — GUAIFENESIN-CODEINE 100-10 MG/5ML PO SOLN
5.0000 mL | ORAL | Status: DC | PRN
  Administered 2017-06-01: 5 mL via ORAL
  Filled 2017-06-01: qty 5

## 2017-06-01 NOTE — Progress Notes (Signed)
Pharmacy Antibiotic Note  Shannon Wilkerson is a 72 y.o. female admitted on 05/30/2017 with respiratory failure/COPD exacerbation.  Pharmacy has been consulted for levofloxacin dosing. Vancomycin discontinued during AM rounds 05/31/17  Plan: Per rounds levofloxacin 750mg  IV Q24hr for total of 5 days of therapy.   Will continue to monitor for IV to PO opportunities.   Height: 5\' 4"  (162.6 cm) Weight: 131 lb 2.8 oz (59.5 kg) IBW/kg (Calculated) : 54.7  Temp (24hrs), Avg:98.5 F (36.9 C), Min:98.4 F (36.9 C), Max:98.7 F (37.1 C)  Recent Labs  Lab 05/30/17 0801 05/30/17 0947 05/31/17 0650 06/01/17 1247  WBC 7.8  --  6.0 15.2*  CREATININE 0.36*  --  0.62 0.54  LATICACIDVEN 0.9 0.8  --   --     Estimated Creatinine Clearance: 56.5 mL/min (by C-G formula based on SCr of 0.54 mg/dL).    Allergies  Allergen Reactions  . Fluvirin [Flu Virus Vaccine] Diarrhea  . Penicillins Hives and Other (See Comments)    Has patient had a PCN reaction causing immediate rash, facial/tongue/throat swelling, SOB or lightheadedness with hypotension: No Has patient had a PCN reaction causing severe rash involving mucus membranes or skin necrosis: No Has patient had a PCN reaction that required hospitalization No Has patient had a PCN reaction occurring within the last 10 years: No If all of the above answers are "NO", then may proceed with Cephalosporin use.  . Tetanus Toxoid, Adsorbed Diarrhea  . Tetanus Toxoids Diarrhea    Antimicrobials this admission: Aztreonam x 1 2/7  Vancomycin  2/7 >> 2/7 Levofloxacin 2/7 >>   Dose adjustments this admission: N/A  Microbiology results: 2/7 BCx: no growth < 24 hours  2/7 UCx: no growth  2/7 MRSA PCR: negative   Thank you for allowing pharmacy to be a part of this patient's care.  Lucerito Rosinski A 06/01/2017 4:00 PM

## 2017-06-01 NOTE — Progress Notes (Signed)
  [facility]    SYNOPSIS   Mrs. [name] is a 72 year old female with a past medical history remarkable for hypertension, hypothyroidism, chronic anemia, anxiety disorder, severe COPD, prior history of cervical fusion C4-C5, C5-C6, patient is on chronic home oxygen therapy presented to the emergency department, brought in tonight by her husband with complaints of increasing shortness of breath and lethargy was found to have saturation in the 60s per EMS.  Patient was started on noninvasive ventilation   ASSESSMENT/PLAN   Acute on chronic hypercapnic respiratory failure requiring noninvasive ventilation. Right middle lobe pneumonia noted Presently on Levaquin, Solu-Medrol, albuterol, Atrovent, vancomycin. Patient with end-stage lung disease we'll consult palliative care to help establish goals of care.  VENTILATOR SETTINGS: FiO2 (%):  [40 %-50 %] 40 %   Name: Shannon Wilkerson [place] MRN: [id number] DOB: September 22, 1945    ADMISSION DATE:  05/30/2017  SUBJECTIVE:   Patient is presently on NIV, had discussion with palliative care yesterday  VITAL SIGNS: Temp:  [97.9 F (36.6 C)-98.7 F (37.1 C)] 98.7 F (37.1 C) (02/09 0200) Pulse Rate:  [88-114] 89 (02/09 0600) Resp:  [13-24] 16 (02/09 0600) BP: (104-148)/(59-98) 124/71 (02/09 0600) SpO2:  [87 %-100 %] 97 % (02/09 0752) FiO2 (%):  [40 %-50 %] 40 % (02/09 0752)   PHYSICAL EXAMINATION: Physical Examination:   VS: BP 124/71   Pulse 89   Temp 98.7 F (37.1 C) (Axillary)   Resp 16   Ht 5\' 4"  (1.626 m)   Wt 131 lb 2.8 oz (59.5 kg)   SpO2 97%   BMI 22.52 kg/m   General Appearance: On NIV in mild respiratory distress  Neuro:without focal findings, mental status normal. HEENT: Limited oral exam, trachea is midline, mild accessory muscle utilization Pulmonary: Markedly diminished breath sounds with prolonged expiratory phase Cardiovascular tachycardia appreciated with distant heart sounds   Abdomen:  Benign, Soft, non-tender. Skin:   warm, no rashes, no ecchymosis  Extremities: normal, no cyanosis, clubbing.    LABORATORY PANEL:   CBC Recent Labs  Lab 05/31/17 0650  WBC 6.0  HGB 9.8*  HCT 31.9*  PLT 131*    Chemistries  Recent Labs  Lab 05/30/17 0801 05/31/17 0650  NA 145 145  K 4.1 4.9  CL 94* 99*  CO2 40* 37*  GLUCOSE 137* 122*  BUN 17 22*  CREATININE 0.36* 0.62  CALCIUM 9.8 9.0  AST 26  --   ALT 28  --   ALKPHOS 111  --   BILITOT 0.7  --     Recent Labs  Lab 05/30/17 1425  GLUCAP 102*   Recent Labs  Lab 05/30/17 0802  PHART 7.21*  PCO2ART 118*  PO2ART 93   Recent Labs  Lab 05/30/17 0801  AST 26  ALT 28  ALKPHOS 111  BILITOT 0.7  ALBUMIN 3.6    Cardiac Enzymes Recent Labs  Lab 05/30/17 0801  TROPONINI <0.03    RADIOLOGY:  No results found.  Tora KindredJohn Matasha Smigelski, DO  2/9/2019Patient ID: Shannon Wilkerson Genna, female   DOB: September 22, 1945, 89 y.o.   MRN: [id number] Patient ID: Shannon Wilkerson [place], female   DOB: September 22, 1945, 72 y.o.   MRN: [id number]

## 2017-06-01 NOTE — Progress Notes (Signed)
[place] at [place] NAME: Presli Fanguy    MR#:  [id number]  DATE OF BIRTH:  [birth year]  SUBJECTIVE:  Pt very fatigued and tired. "xpressing her wishes not to live like this." Husband in the room.  Patient appears more alert today REVIEW OF SYSTEMS:   Review of Systems  Constitutional: Positive for malaise/fatigue. Negative for chills, fever and weight loss.  HENT: Negative for ear discharge, ear pain and nosebleeds.   Eyes: Negative for blurred vision, pain and discharge.  Respiratory: Positive for cough, sputum production and shortness of breath. Negative for wheezing and stridor.   Cardiovascular: Negative for chest pain, palpitations, orthopnea and PND.  Gastrointestinal: Negative for abdominal pain, diarrhea, nausea and vomiting.  Genitourinary: Negative for frequency and urgency.  Musculoskeletal: Negative for back pain and joint pain.  Neurological: Positive for weakness. Negative for sensory change, speech change and focal weakness.  Psychiatric/Behavioral: Negative for depression and hallucinations. The patient is not nervous/anxious.    Tolerating Diet:some   DRUG ALLERGIES:   Allergies  Allergen Reactions  . Fluvirin [Flu Virus Vaccine] Diarrhea  . Penicillins Hives and Other (See Comments)    Has patient had a PCN reaction causing immediate rash, facial/tongue/throat swelling, SOB or lightheadedness with hypotension: No Has patient had a PCN reaction causing severe rash involving mucus membranes or skin necrosis: No Has patient had a PCN reaction that required hospitalization No Has patient had a PCN reaction occurring within the last 10 years: No If all of the above answers are "NO", then may proceed with Cephalosporin use.  . Tetanus Toxoid, Adsorbed Diarrhea  . Tetanus Toxoids Diarrhea    VITALS:  Blood pressure (!) 159/88, pulse (!) 107, temperature 98.4 F (36.9 C), temperature source Oral, resp. rate 18,  height '5\' 4"'  (1.626 m), weight 59.5 kg (131 lb 2.8 oz), SpO2 93 %.  PHYSICAL EXAMINATION:   Physical Exam  GENERAL:  72 y.o.-year-old patient lying in the bed with no acute distress. Appears ill EYES: Pupils equal, round, reactive to light and accommodation. No scleral icterus. Extraocular muscles intact.  HEENT: Head atraumatic, normocephalic. Oropharynx and nasopharynx clear.  NECK:  Supple, no jugular venous distention. No thyroid enlargement, no tenderness.  LUNGS:coarse breath sounds bilaterally, no wheezing, ++rales, rhonchi. No use of accessory muscles of respiration.  CARDIOVASCULAR: S1, S2 normal. No murmurs, rubs, or gallops. tachycardia ABDOMEN: Soft, nontender, nondistended. Bowel sounds present. No organomegaly or mass.  EXTREMITIES: No cyanosis, clubbing or edema b/l.    NEUROLOGIC: Cranial nerves II through XII are intact. No focal Motor or sensory deficits b/l.   PSYCHIATRIC:  patient is alert and oriented x 3.  SKIN: No obvious rash, lesion, or ulcer.   LABORATORY PANEL:  CBC Recent Labs  Lab 06/01/17 1247  WBC 15.2*  HGB 9.9*  HCT 32.6*  PLT 183    Chemistries  Recent Labs  Lab 05/30/17 0801  06/01/17 1247  NA 145   < > 144  K 4.1   < > 4.2  CL 94*   < > 98*  CO2 40*   < > 37*  GLUCOSE 137*   < > 134*  BUN 17   < > 26*  CREATININE 0.36*   < > 0.54  CALCIUM 9.8   < > 9.3  MG  --   --  1.9  AST 26  --   --   ALT 28  --   --   ALKPHOS 111  --   --  BILITOT 0.7  --   --    < > = values in this interval not displayed.   Cardiac Enzymes Recent Labs  Lab 05/30/17 0801  TROPONINI <0.03   RADIOLOGY:  No results found. ASSESSMENT AND PLAN:  Dannisha Eckmann Sichiis a 72 y.o.femalewith a past medical history of COPD on 3 L of oxygen, asthma, hypertension, CVA, presents to the emergency department for difficulty breathing. According to EMS they were called by the patient's husband for difficulty breathing. Patient lives at home, wears 3 L of oxygen for  COPD. EMS states initial O2 saturation on nasal cannula oxygen in the 60s  1.  Acute on chronic hypoxic/hypercarbic respiratory failure secondary to COPD exacerbation in the setting of right sided pneumonia -Currently off BiPAP -Discussed with ICU attending Dr. Jefferson Fuel -IV Solu-Medrol, inhalers, nebulized -IV antibiotic with Levaquin -MRSA PCR screening -Follow blood cultures sputum culture  2.  Sepsis secondary to right-sided pneumonia -IV antibiotics as above -Follow blood cultures  3.  Transient SVT -Resolved after 1 dose of adenosine in the ER  4.  History of hypertension continue home meds when able to take oral  5.  DVT prophylaxis subcu Lovenox  Pt and husbancd met with Palliatve care NP to discuss goals of care---pt requesting to go home with hospice.awaiting to d/w other family members   Patient is critically ill at present According to the husband patient is a DNR    Case discussed with Care Management/Social Worker. Management plans discussed with the patient, family and they are in agreement.  CODE STATUS: DNR  DVT Prophylaxis: lovenox  TOTAL TIME TAKING CARE OF THIS PATIENT: *30* minutes.  >50% time spent on counselling and coordination of care  POSSIBLE D/C IN few DAYS, DEPENDING ON CLINICAL CONDITION.  Note: This dictation was prepared with Dragon dictation along with smaller phrase technology. Any transcriptional errors that result from this process are unintentional.  Fritzi Mandes M.D on 06/01/2017 at 3:44 PM  Between 7am to 6pm - Pager - [phone]  After 6pm go to [email or web address] - password [email or web address] [facility] [facility]  Office  [phone]  CC: Primary care physician; Lenard Simmer, MDPatient ID: [name], female   DOB: 04/10/1946, 72 y.o.   MRN: [id number]

## 2017-06-01 NOTE — Progress Notes (Signed)
Pt with 2 episodes non-symptomatic SVT-> 12-lead, and labs ordered.

## 2017-06-01 NOTE — Progress Notes (Signed)
BiPap on standby at this time, patient on 4l [place] and tol well at this time.

## 2017-06-01 NOTE — Progress Notes (Signed)
Patient taken off bipap at this time and placed on 5l nasal cannula. Saturations are 96 -97%.

## 2017-06-02 ENCOUNTER — Inpatient Hospital Stay: Payer: Medicare Other

## 2017-06-02 LAB — BASIC METABOLIC PANEL
ANION GAP: 9 (ref 5–15)
BUN: 23 mg/dL — ABNORMAL HIGH (ref 6–20)
CALCIUM: 8.9 mg/dL (ref 8.9–10.3)
CO2: 38 mmol/L — AB (ref 22–32)
CREATININE: 0.45 mg/dL (ref 0.44–1.00)
Chloride: 100 mmol/L — ABNORMAL LOW (ref 101–111)
GFR calc Af Amer: >60 mL/min (ref >60)
GFR calc non Af Amer: >60 mL/min (ref >60)
GLUCOSE: 137 mg/dL — AB (ref 65–99)
Potassium: 4.7 mmol/L (ref 3.5–5.1)
Sodium: 147 mmol/L — ABNORMAL HIGH (ref 135–145)

## 2017-06-02 LAB — CBC
HEMATOCRIT: 31.5 % — AB (ref 35.0–47.0)
Hemoglobin: 9.9 g/dL — ABNORMAL LOW (ref 12.0–16.0)
MCH: 25.6 pg — ABNORMAL LOW (ref 26.0–34.0)
MCHC: 31.4 g/dL — ABNORMAL LOW (ref 32.0–36.0)
MCV: 81.6 fL (ref 80.0–100.0)
Platelets: 194 10*3/uL (ref 150–440)
RBC: 3.86 MIL/uL (ref 3.80–5.20)
RDW: 16.4 % — AB (ref 11.5–14.5)
WBC: 9.7 10*3/uL (ref 3.6–11.0)

## 2017-06-02 LAB — MAGNESIUM: Magnesium: 2 mg/dL (ref 1.7–2.4)

## 2017-06-02 LAB — PHOSPHORUS: Phosphorus: 3.2 mg/dL (ref 2.5–4.6)

## 2017-06-02 MED ORDER — IPRATROPIUM-ALBUTEROL 0.5-2.5 (3) MG/3ML IN SOLN
3.0000 mL | Freq: Four times a day (QID) | RESPIRATORY_TRACT | Status: DC
  Administered 2017-06-02 – 2017-06-05 (×14): 3 mL via RESPIRATORY_TRACT
  Filled 2017-06-02 (×14): qty 3

## 2017-06-02 MED ORDER — ATORVASTATIN CALCIUM 20 MG PO TABS
20.0000 mg | ORAL_TABLET | Freq: Every day | ORAL | Status: DC
  Administered 2017-06-02 – 2017-06-04 (×3): 20 mg via ORAL
  Filled 2017-06-02 (×3): qty 1

## 2017-06-02 MED ORDER — METOPROLOL TARTRATE 5 MG/5ML IV SOLN
5.0000 mg | Freq: Once | INTRAVENOUS | Status: AC
  Administered 2017-06-02: 5 mg via INTRAVENOUS
  Filled 2017-06-02: qty 5

## 2017-06-02 MED ORDER — AMLODIPINE BESYLATE 10 MG PO TABS
10.0000 mg | ORAL_TABLET | Freq: Every day | ORAL | Status: DC
  Administered 2017-06-02 – 2017-06-05 (×4): 10 mg via ORAL
  Filled 2017-06-02 (×4): qty 1

## 2017-06-02 NOTE — Progress Notes (Signed)
Bipap on standby at this time. 

## 2017-06-02 NOTE — Progress Notes (Signed)
[place] at [place] NAME: Shannon Wilkerson    MR#:  [id number]  DATE OF BIRTH:  03-Apr-1946  SUBJECTIVE:  Pt very fatigued and tired.  Sister came from [place] and the patient wants to talk to her family members and need more time before she make some final decision. Felt better after getting breathing treatment today  REVIEW OF SYSTEMS:   Review of Systems  Constitutional: Positive for malaise/fatigue. Negative for chills, fever and weight loss.  HENT: Negative for ear discharge, ear pain and nosebleeds.   Eyes: Negative for blurred vision, pain and discharge.  Respiratory: Positive for cough, sputum production and shortness of breath. Negative for wheezing and stridor.   Cardiovascular: Negative for chest pain, palpitations, orthopnea and PND.  Gastrointestinal: Negative for abdominal pain, diarrhea, nausea and vomiting.  Genitourinary: Negative for frequency and urgency.  Musculoskeletal: Negative for back pain and joint pain.  Neurological: Positive for weakness. Negative for sensory change, speech change and focal weakness.  Psychiatric/Behavioral: Negative for depression and hallucinations. The patient is not nervous/anxious.    Tolerating Diet:some   DRUG ALLERGIES:   Allergies  Allergen Reactions  . Fluvirin [Flu Virus Vaccine] Diarrhea  . Penicillins Hives and Other (See Comments)    Has patient had a PCN reaction causing immediate rash, facial/tongue/throat swelling, SOB or lightheadedness with hypotension: No Has patient had a PCN reaction causing severe rash involving mucus membranes or skin necrosis: No Has patient had a PCN reaction that required hospitalization No Has patient had a PCN reaction occurring within the last 10 years: No If all of the above answers are "NO", then may proceed with Cephalosporin use.  . Tetanus Toxoid, Adsorbed Diarrhea  . Tetanus Toxoids Diarrhea    VITALS:  Blood pressure (!) 180/92,  pulse (!) 109, temperature 98.8 F (37.1 C), temperature source Axillary, resp. rate 18, height '5\' 4"'  (1.626 m), weight 59.5 kg (131 lb 2.8 oz), SpO2 91 %.  PHYSICAL EXAMINATION:   Physical Exam  GENERAL:  72 y.o.-year-old patient lying in the bed with no acute distress. Appears ill EYES: Pupils equal, round, reactive to light and accommodation. No scleral icterus. Extraocular muscles intact.  HEENT: Head atraumatic, normocephalic. Oropharynx and nasopharynx clear.  NECK:  Supple, no jugular venous distention. No thyroid enlargement, no tenderness.  LUNGS:coarse breath sounds bilaterally, no wheezing, ++rales, rhonchi. No use of accessory muscles of respiration.  CARDIOVASCULAR: S1, S2 normal. No murmurs, rubs, or gallops. tachycardia ABDOMEN: Soft, nontender, nondistended. Bowel sounds present. No organomegaly or mass.  EXTREMITIES: No cyanosis, clubbing or edema b/l.    NEUROLOGIC: Cranial nerves II through XII are intact. No focal Motor or sensory deficits b/l.   PSYCHIATRIC:  patient is alert and oriented x 3.  SKIN: No obvious rash, lesion, or ulcer.   LABORATORY PANEL:  CBC Recent Labs  Lab 06/02/17 0452  WBC 9.7  HGB 9.9*  HCT 31.5*  PLT 194    Chemistries  Recent Labs  Lab 05/30/17 0801  06/02/17 0452  NA 145   < > 147*  K 4.1   < > 4.7  CL 94*   < > 100*  CO2 40*   < > 38*  GLUCOSE 137*   < > 137*  BUN 17   < > 23*  CREATININE 0.36*   < > 0.45  CALCIUM 9.8   < > 8.9  MG  --    < > 2.0  AST 26  --   --  ALT 28  --   --   ALKPHOS 111  --   --   BILITOT 0.7  --   --    < > = values in this interval not displayed.   Cardiac Enzymes Recent Labs  Lab 05/30/17 0801  TROPONINI <0.03   RADIOLOGY:  Dg Chest Port 1 View  Result Date: 06/02/2017 CLINICAL DATA:  Pneumonia EXAM: PORTABLE CHEST 1 VIEW COMPARISON:  05/30/2017 FINDINGS: Consolidation in the right lower lobe compatible with pneumonia. Minimal left base atelectasis or pneumonia. There is  hyperinflation of the lungs compatible with COPD. Small right effusion. Heart is borderline in size. IMPRESSION: Right lower lobe consolidation compatible with pneumonia. Small right effusion. Minimal left base atelectasis or infiltrate. COPD. Electronically Signed   By: Rolm Baptise M.D.   On: 06/02/2017 07:34   ASSESSMENT AND PLAN:  Shannon Wilkerson a 72 y.o.femalewith a past medical history of COPD on 3 L of oxygen, asthma, hypertension, CVA, presents to the emergency department for difficulty breathing. According to EMS they were called by the patient's husband for difficulty breathing. Patient lives at home, wears 3 L of oxygen for COPD. EMS states initial O2 saturation on nasal cannula oxygen in the 60s  1.  Acute on chronic hypoxic/hypercarbic respiratory failure secondary to COPD exacerbation in the setting of right sided pneumonia -Currently off BiPAP, on oxygen via nasal cannula -Discussed with ICU attending Dr. [place] Fuel -IV Solu-Medrol, inhalers, nebulized -IV antibiotic with Levaquin -MRSA PCR screening -Negative blood cultures   2.  Sepsis secondary to right-sided pneumonia -IV antibiotics as above -blood cultures are negative for 2 days  3.  Transient SVT -Resolved after 1 dose of adenosine in the ER  4.  History of hypertension continue home meds when able to take oral  5.  DVT prophylaxis subcu Lovenox  Pt and husbancd met with Palliatve care NP to discuss goals of care---pt requesting to go home with hospice.awaiting to d/w other family members, sister came from [place]   Patient is critically ill at present According to the husband patient is a DNR    Case discussed with Care Management/Social Worker. Management plans discussed with the patient, family and they are in agreement.  CODE STATUS: DNR  DVT Prophylaxis: lovenox  TOTAL TIME TAKING CARE OF THIS PATIENT: 30 minutes.  >50% time spent on counselling and coordination of care  POSSIBLE D/C IN  few DAYS, DEPENDING ON CLINICAL CONDITION.  Note: This dictation was prepared with Dragon dictation along with smaller phrase technology. Any transcriptional errors that result from this process are unintentional.  Nicholes Mango M.D on 06/02/2017 at 10:23 AM  Between 7am to 6pm - Pager - [phone]  After 6pm go to [email or web address] - password [email or web address] [place] [facility]  Office  [phone]  CC: Primary care physician; Lenard Simmer, MDPatient ID: Shannon Wilkerson, female   DOB: 1946/03/24, 72 y.o.   MRN: [id number]

## 2017-06-02 NOTE — Progress Notes (Signed)
Pt has remained alert and oriented with c/o anxiety->xanax given x 2 and redirection.  Pt transitioned to 4LNC from bipap 40% this am and has remained off throughout the day. Lung sounds clear/diminshed to auscultation. Pt has tolerated CPT and IS with goal of 750, pt hits 550. Cough remains weak and non-productive.   Pt has remained in ST low 100's, BP elevated-sys 160-170-> Norvasc 10mg  added. No SVT noted today.

## 2017-06-02 NOTE — Progress Notes (Signed)
Pharmacy Antibiotic Note  Shannon Wilkerson is a 72 y.o. female admitted on 05/30/2017 with respiratory failure/COPD exacerbation.  Pharmacy has been consulted for levofloxacin dosing. Vancomycin discontinued during AM rounds 05/31/17  Plan: Per rounds levofloxacin 750mg  IV Q24hr for total of 5 days of therapy.   Will continue to monitor for IV to PO opportunities.   Height: 5\' 4"  (162.6 cm) Weight: 131 lb 2.8 oz (59.5 kg) IBW/kg (Calculated) : 54.7  Temp (24hrs), Avg:98.7 F (37.1 C), Min:98.5 F (36.9 C), Max:98.9 F (37.2 C)  Recent Labs  Lab 05/30/17 0801 05/30/17 0947 05/31/17 0650 06/01/17 1247 06/02/17 0452  WBC 7.8  --  6.0 15.2* 9.7  CREATININE 0.36*  --  0.62 0.54 0.45  LATICACIDVEN 0.9 0.8  --   --   --     Estimated Creatinine Clearance: 56.5 mL/min (by C-G formula based on SCr of 0.45 mg/dL).    Allergies  Allergen Reactions  . Fluvirin [Flu Virus Vaccine] Diarrhea  . Penicillins Hives and Other (See Comments)    Has patient had a PCN reaction causing immediate rash, facial/tongue/throat swelling, SOB or lightheadedness with hypotension: No Has patient had a PCN reaction causing severe rash involving mucus membranes or skin necrosis: No Has patient had a PCN reaction that required hospitalization No Has patient had a PCN reaction occurring within the last 10 years: No If all of the above answers are "NO", then may proceed with Cephalosporin use.  . Tetanus Toxoid, Adsorbed Diarrhea  . Tetanus Toxoids Diarrhea    Antimicrobials this admission: Aztreonam x 1 2/7  Vancomycin  2/7 >> 2/7 Levofloxacin 2/7 >>   Dose adjustments this admission: N/A  Microbiology results: 2/7 BCx: no growth < 24 hours  2/7 UCx: no growth  2/7 MRSA PCR: negative   Thank you for allowing pharmacy to be a part of this patient's care.  Jagjit Riner A 06/02/2017 3:24 PM

## 2017-06-02 NOTE — Progress Notes (Signed)
NOTE TO PHYSICIAN FROM PHARMACY   CONCERNING:  Simvastatin 40 mg daily and amlodipine - rhabdomyolysis risk   DESCRIPTION:  Patients on amlodipine and simvastatin >20mg /day have reported cases of rhabdomyolysis.  PLAN: Simvastatin dose is > 20 mg, per protocol will substitute atorvastatin (Lipitor) 1mg  for each 2mg  simvastatin.  Gardner CandleSheema M Lonnie Rosado, PharmD, BCPS Clinical Pharmacist 06/02/2017 3:16 PM

## 2017-06-02 NOTE — Plan of Care (Signed)
A&O patient, rested comfortably this shift. Pt denies pain. Tolerated bipap throughout the night. VSS.

## 2017-06-02 NOTE — Progress Notes (Signed)
[facility]    SYNOPSIS   [name] is a 72 year old female with a past medical history remarkable for hypertension, hypothyroidism, chronic anemia, anxiety disorder, severe COPD, prior history of cervical fusion C4-C5, C5-C6, patient is on chronic home oxygen therapy presented to the emergency department, brought in tonight by her husband with complaints of increasing shortness of breath and lethargy was found to have saturation in the 60s per EMS.  Patient was started on noninvasive ventilation   ASSESSMENT/PLAN   Acute on chronic hypercapnic respiratory failure requiring noninvasive ventilation. Right middle lobe pneumonia noted Presently on Levaquin, Solu-Medrol, albuterol, Atrovent, vancomycin. Patient with end-stage lung disease we'll consult palliative care to help establish goals of care.  Name: Shannon Wilkerson MRN: [id number] DOB: 01/04/1946    ADMISSION DATE:  05/30/2017  SUBJECTIVE:   Patient is presently on NIV, had discussion with palliative care yesterday  VITAL SIGNS: Temp:  [98.5 F (36.9 C)-98.9 F (37.2 C)] 98.9 F (37.2 C) (02/10 0100) Pulse Rate:  [77-117] 77 (02/10 0600) Resp:  [12-27] 16 (02/10 0600) BP: (117-192)/(73-106) 164/78 (02/10 0600) SpO2:  [87 %-97 %] 94 % (02/10 0807)   PHYSICAL EXAMINATION: Physical Examination:   VS: BP (!) 164/78   Pulse 77   Temp 98.9 F (37.2 C) (Axillary)   Resp 16   Ht 5\' 4"  (1.626 m)   Wt 131 lb 2.8 oz (59.5 kg)   SpO2 94%   BMI 22.52 kg/m   General Appearance: On NIV in mild respiratory distress  Neuro:without focal findings, mental status normal. HEENT: Limited oral exam, trachea is midline, mild accessory muscle utilization Pulmonary: Markedly diminished breath sounds with prolonged expiratory phase Cardiovascular tachycardia appreciated with distant heart sounds   Abdomen: Benign, Soft, non-tender. Skin:   warm, no rashes, no ecchymosis  Extremities: normal, no  cyanosis, clubbing.    LABORATORY PANEL:   CBC Recent Labs  Lab 06/02/17 0452  WBC 9.7  HGB 9.9*  HCT 31.5*  PLT 194    Chemistries  Recent Labs  Lab 05/30/17 0801  06/02/17 0452  NA 145   < > 147*  K 4.1   < > 4.7  CL 94*   < > 100*  CO2 40*   < > 38*  GLUCOSE 137*   < > 137*  BUN 17   < > 23*  CREATININE 0.36*   < > 0.45  CALCIUM 9.8   < > 8.9  MG  --    < > 2.0  PHOS  --   --  3.2  AST 26  --   --   ALT 28  --   --   ALKPHOS 111  --   --   BILITOT 0.7  --   --    < > = values in this interval not displayed.    Recent Labs  Lab 05/30/17 1425  GLUCAP 102*   Recent Labs  Lab 05/30/17 0802  PHART 7.21*  PCO2ART 118*  PO2ART 93   Recent Labs  Lab 05/30/17 0801  AST 26  ALT 28  ALKPHOS 111  BILITOT 0.7  ALBUMIN 3.6    Cardiac Enzymes Recent Labs  Lab 05/30/17 0801  TROPONINI <0.03    RADIOLOGY:  Dg Chest Port 1 View  Result Date: 06/02/2017 CLINICAL DATA:  Pneumonia EXAM: PORTABLE CHEST 1 VIEW COMPARISON:  05/30/2017 FINDINGS: Consolidation in the right lower lobe compatible with pneumonia. Minimal left base atelectasis or pneumonia. There is  hyperinflation of the lungs compatible with COPD. Small right effusion. Heart is borderline in size. IMPRESSION: Right lower lobe consolidation compatible with pneumonia. Small right effusion. Minimal left base atelectasis or infiltrate. COPD. Electronically Signed   By: Charlett Nose M.D.   On: 06/02/2017 07:34    Tora Kindred, DO  2/10/2019Patient ID: Shannon Wilkerson, female   DOB: [birth year], 72 y.o.   MRN: [id number] Patient ID: Shannon Wilkerson, female   DOB: 02-Jul-1945, 72 y.o.   MRN: [id number] Patient ID: Shannon Wilkerson, female   DOB: April 05, 1946, 72 y.o.   MRN: [id number]

## 2017-06-03 MED ORDER — METHYLPREDNISOLONE SODIUM SUCC 40 MG IJ SOLR
40.0000 mg | Freq: Two times a day (BID) | INTRAMUSCULAR | Status: DC
  Administered 2017-06-03 – 2017-06-05 (×4): 40 mg via INTRAVENOUS
  Filled 2017-06-03 (×4): qty 1

## 2017-06-03 MED ORDER — ALPRAZOLAM 0.5 MG PO TABS
0.5000 mg | ORAL_TABLET | Freq: Four times a day (QID) | ORAL | Status: DC | PRN
  Administered 2017-06-03 – 2017-06-05 (×6): 0.5 mg via ORAL
  Filled 2017-06-03 (×6): qty 1

## 2017-06-03 MED ORDER — ORAL CARE MOUTH RINSE
15.0000 mL | Freq: Two times a day (BID) | OROMUCOSAL | Status: DC
  Administered 2017-06-03 – 2017-06-05 (×4): 15 mL via OROMUCOSAL

## 2017-06-03 NOTE — Progress Notes (Signed)
[name] Critical Care Medicine Progess Note    SYNOPSIS   Shannon Wilkerson is a 72 year old female with a past medical history remarkable for hypertension, hypothyroidism, chronic anemia, anxiety disorder, severe COPD, prior history of cervical fusion C4-C5, C5-C6, patient is on chronic home oxygen therapy presented to the emergency department, brought in tonight by her husband with complaints of increasing shortness of breath and lethargy was found to have saturation in the 60s per EMS.  Patient was started on noninvasive ventilation  .  Name: Shannon Wilkerson MRN: [id number] DOB: [birth year]    ADMISSION DATE:  05/30/2017   CC follow up resp distress  HPI Alert and awake Off of biPAP Denies CP,SOB Resting comfortably   VITAL SIGNS: Temp:  [97.6 F (36.4 C)-98.6 F (37 C)] 98 F (36.7 C) (02/11 0753) Pulse Rate:  [76-115] 78 (02/11 0600) Resp:  [14-25] 17 (02/11 0600) BP: (127-180)/(72-114) 164/85 (02/11 0600) SpO2:  [89 %-99 %] 99 % (02/11 0600) FiO2 (%):  [36 %] 36 % (02/11 0108)    Review of Systems:  Gen:  Denies  fever, sweats, chills weigh loss  HEENT: Denies blurred vision, double vision, ear pain, eye pain, hearing loss, nose bleeds, sore throat Cardiac:  No dizziness, chest pain or heaviness, chest tightness,edema Resp:  -shortness of breath,-wheezing, -hemoptysis,  Other:  All other systems negative  Physical Examination:   VS: BP (!) 164/85   Pulse 78   Temp 98 F (36.7 C) (Oral)   Resp 17   Ht 5\' 4"  (1.626 m)   Wt 131 lb 2.8 oz (59.5 kg)   SpO2 99%   BMI 22.52 kg/m   General Appearance: on [place] NAD Neuro:without focal findings, mental status normal. HEENT: Limited oral exam, trachea is midline, mild accessory muscle utilization Pulmonary: Markedly diminished breath sounds with prolonged expiratory phase Cardiovascular tachycardia appreciated with distant heart sounds   Abdomen: Benign, Soft, non-tender. Skin:   warm, no rashes, no ecchymosis    Extremities: normal, no cyanosis, clubbing.    LABORATORY PANEL:   CBC Recent Labs  Lab 06/02/17 0452  WBC 9.7  HGB 9.9*  HCT 31.5*  PLT 194    Chemistries  Recent Labs  Lab 05/30/17 0801  06/02/17 0452  NA 145   < > 147*  K 4.1   < > 4.7  CL 94*   < > 100*  CO2 40*   < > 38*  GLUCOSE 137*   < > 137*  BUN 17   < > 23*  CREATININE 0.36*   < > 0.45  CALCIUM 9.8   < > 8.9  MG  --    < > 2.0  PHOS  --   --  3.2  AST 26  --   --   ALT 28  --   --   ALKPHOS 111  --   --   BILITOT 0.7  --   --    < > = values in this interval not displayed.    Recent Labs  Lab 05/30/17 1425  GLUCAP 102*   Recent Labs  Lab 05/30/17 0802  PHART 7.21*  PCO2ART 118*  PO2ART 93    ASSESSMENT/PLAN   Acute on chronic hypercapnic respiratory failure requiring noninvasive ventilation. Right middle lobe pneumonia noted Presently on Levaquin, Solu-Medrol, albuterol, Atrovent, vancomycin. Patient with end-stage lung disease we'll consult palliative care to help establish goals of care    Patient/Family are satisfied with Plan of action and management. All questions  answered Consider transferring to gen med floor  Aizlyn Schifano Santiago Gladavid Lexia Vandevender, M.D.  [facility]  Medical Director [facility] Medical Director [facility]

## 2017-06-03 NOTE — Progress Notes (Signed)
[place] at [place] NAME: Shannon Wilkerson    MR#:  [id number]  DATE OF BIRTH:  12-03-1945  SUBJECTIVE:  Pt very fatigued and tired.  Decided to consider hospice care.  Wants to talk to palliative care people.  Patient husband at bedside  REVIEW OF SYSTEMS:   Review of Systems  Constitutional: Positive for malaise/fatigue. Negative for chills, fever and weight loss.  HENT: Negative for ear discharge, ear pain and nosebleeds.   Eyes: Negative for blurred vision, pain and discharge.  Respiratory: Positive for cough, sputum production and shortness of breath. Negative for wheezing and stridor.   Cardiovascular: Negative for chest pain, palpitations, orthopnea and PND.  Gastrointestinal: Negative for abdominal pain, diarrhea, nausea and vomiting.  Genitourinary: Negative for frequency and urgency.  Musculoskeletal: Negative for back pain and joint pain.  Neurological: Positive for weakness. Negative for sensory change, speech change and focal weakness.  Psychiatric/Behavioral: Negative for depression and hallucinations. The patient is not nervous/anxious.    Tolerating Diet:some   DRUG ALLERGIES:   Allergies  Allergen Reactions  . Fluvirin [Flu Virus Vaccine] Diarrhea  . Penicillins Hives and Other (See Comments)    Has patient had a PCN reaction causing immediate rash, facial/tongue/throat swelling, SOB or lightheadedness with hypotension: No Has patient had a PCN reaction causing severe rash involving mucus membranes or skin necrosis: No Has patient had a PCN reaction that required hospitalization No Has patient had a PCN reaction occurring within the last 10 years: No If all of the above answers are "NO", then may proceed with Cephalosporin use.  . Tetanus Toxoid, Adsorbed Diarrhea  . Tetanus Toxoids Diarrhea    VITALS:  Blood pressure (!) 153/90, pulse 96, temperature 98.2 F (36.8 C), resp. rate 17, height _0  (1.626 m),  weight 59.5 kg (131 lb 2.8 oz), SpO2 94 %.  PHYSICAL EXAMINATION:   Physical Exam  GENERAL:  72 y.o.-year-old patient lying in the bed with no acute distress. Appears ill EYES: Pupils equal, round, reactive to light and accommodation. No scleral icterus. Extraocular muscles intact.  HEENT: Head atraumatic, normocephalic. Oropharynx and nasopharynx clear.  NECK:  Supple, no jugular venous distention. No thyroid enlargement, no tenderness.  LUNGS:coarse breath sounds bilaterally, no wheezing, ++rales, rhonchi. No use of accessory muscles of respiration.  CARDIOVASCULAR: S1, S2 normal. No murmurs, rubs, or gallops. tachycardia ABDOMEN: Soft, nontender, nondistended. Bowel sounds present. No organomegaly or mass.  EXTREMITIES: No cyanosis, clubbing or edema b/l.    NEUROLOGIC: Cranial nerves II through XII are intact. No focal Motor or sensory deficits b/l.   PSYCHIATRIC:  patient is alert and oriented x 3.  SKIN: No obvious rash, lesion, or ulcer.   LABORATORY PANEL:  CBC Recent Labs  Lab 06/02/17 0452  WBC 9.7  HGB 9.9*  HCT 31.5*  PLT 194    Chemistries  Recent Labs  Lab 05/30/17 0801  06/02/17 0452  NA 145   < > 147*  K 4.1   < > 4.7  CL 94*   < > 100*  CO2 40*   < > 38*  GLUCOSE 137*   < > 137*  BUN 17   < > 23*  CREATININE 0.36*   < > 0.45  CALCIUM 9.8   < > 8.9  MG  --    < > 2.0  AST 26  --   --   ALT 28  --   --   ALKPHOS 111  --   --  BILITOT 0.7  --   --    < > = values in this interval not displayed.   Cardiac Enzymes Recent Labs  Lab 05/30/17 0801  TROPONINI <0.03   RADIOLOGY:  Dg Chest Port 1 View  Result Date: 06/02/2017 CLINICAL DATA:  Pneumonia EXAM: PORTABLE CHEST 1 VIEW COMPARISON:  05/30/2017 FINDINGS: Consolidation in the right lower lobe compatible with pneumonia. Minimal left base atelectasis or pneumonia. There is hyperinflation of the lungs compatible with COPD. Small right effusion. Heart is borderline in size. IMPRESSION: Right lower  lobe consolidation compatible with pneumonia. Small right effusion. Minimal left base atelectasis or infiltrate. COPD. Electronically Signed   By: Rolm Baptise M.D.   On: 06/02/2017 07:34   ASSESSMENT AND PLAN:  Shannon Wilkerson Sichiis a 72 y.o.femalewith a past medical history of COPD on 3 L of oxygen, asthma, hypertension, CVA, presents to the emergency department for difficulty breathing. According to EMS they were called by the patient's husband for difficulty breathing. Patient lives at home, wears 3 L of oxygen for COPD. EMS states initial O2 saturation on nasal cannula oxygen in the 60s  1.  Acute on chronic hypoxic/hypercarbic respiratory failure secondary to COPD exacerbation in the setting of right sided pneumonia - off BiPAP, on oxygen via nasal cannula -Discussed with ICU attending Dr. Mortimer Fries -IV Solu-Medrol, inhalers, nebulized -IV antibiotic with Levaquin -MRSA PCR screening -Negative blood cultures   2.  Sepsis secondary to right-sided pneumonia -IV antibiotics as above -blood cultures are negative for 2 days  3.  Transient SVT -Resolved after 1 dose of adenosine in the ER  4.  History of hypertension continue home meds when able to take oral  5.  DVT prophylaxis subcu Lovenox  Pt and husbancd met with Palliatve care NP to discuss goals of care---pt requesting to go home with hospice.awaiting to d/w palliative care and hospice rn   Patient is critically ill with poor prognosis considering, hospice care at home According to the husband patient is a DNR    Case discussed with Care Management/Social Worker. Management plans discussed with the patient, family and they are in agreement.  CODE STATUS: DNR  DVT Prophylaxis: lovenox  TOTAL TIME TAKING CARE OF THIS PATIENT: 30 minutes.  >50% time spent on counselling and coordination of care  POSSIBLE D/C IN few DAYS, DEPENDING ON CLINICAL CONDITION.  Note: This dictation was prepared with Dragon dictation along  with smaller phrase technology. Any transcriptional errors that result from this process are unintentional.  Nicholes Mango M.D on 06/03/2017 at 2:31 PM  Between 7am to 6pm - Pager - [phone]  After 6pm go to [email or web address] - password [email or web address] [facility]  Office  [phone]  CC: Primary care physician; Lenard Simmer, MDPatient ID: Shannon Wilkerson, female   DOB: 22-Jul-1945, 72 y.o.   MRN: [id number]

## 2017-06-03 NOTE — Plan of Care (Signed)
A&O patient, denies pain. Pt c/o anxiety, requests PRN zyprexa, improvement noted. SpO2 remains >90%, tolerating [place] at 4 liters this shift. Pt had 350 ml urine output from external catheter this shift. VSS. High fall risk. Pt refused bath this morning, stated that it was too early.

## 2017-06-03 NOTE — Care Management (Signed)
RNCM was notified by unit clerk that patient's husband wanted to talk with RNCM.  I spoke briefly with Mr. Wengert and he has requested that I speak with his daughter in law [phone] Shannon Wilkerson which is a Marine scientist.  Shannon Wilkerson contacted this RNCM to discuss home with hospice and a hospital bed.  She wishes to talk with Dr. Mortimer Fries. I have updated him.  Shannon Wilkerson indicated that they met with a palliative nurse last week however she "don't think patient is ready for hospice".  I have requested that Dr. Mortimer Fries speak with patient and sylvia.

## 2017-06-04 LAB — CULTURE, BLOOD (ROUTINE X 2)
CULTURE: NO GROWTH
CULTURE: NO GROWTH
SPECIAL REQUESTS: ADEQUATE
Special Requests: ADEQUATE

## 2017-06-04 MED ORDER — MORPHINE SULFATE (CONCENTRATE) 10 MG/0.5ML PO SOLN
5.0000 mg | ORAL | Status: DC | PRN
  Administered 2017-06-04 – 2017-06-05 (×5): 5 mg via ORAL
  Filled 2017-06-04 (×5): qty 1

## 2017-06-04 MED ORDER — SODIUM CHLORIDE 0.9% FLUSH
3.0000 mL | Freq: Two times a day (BID) | INTRAVENOUS | Status: DC
  Administered 2017-06-04 – 2017-06-05 (×3): 3 mL via INTRAVENOUS

## 2017-06-04 MED ORDER — SENNOSIDES-DOCUSATE SODIUM 8.6-50 MG PO TABS
1.0000 | ORAL_TABLET | Freq: Two times a day (BID) | ORAL | Status: DC
  Administered 2017-06-04 – 2017-06-05 (×3): 1 via ORAL
  Filled 2017-06-04 (×3): qty 1

## 2017-06-04 NOTE — Care Management Important Message (Signed)
Important Message  Patient Details  Name: Shannon Wilkerson MRN: [id number] Date of Birth: [birth year]   Medicare Important Message Given:  Yes Signed IM notice given DC home with hospice   Eber HongGreene, Rikita Grabert R, RN 06/04/2017, 9:22 AM

## 2017-06-04 NOTE — Care Management (Signed)
RNCM received late text message from Nettie ElmSylvia [phone], patient's daughter in law, requesting home with [facility]. She would appreciate a meeting with the hospice liaison, spouse, and family this morning. Patient has since moved from ICU to tele unit.  I have sent message to [facility]aren [facility] with this request and request for hospital bed. Patient has home O2 and I believe she said [facility] was [facility].  I have also updated RNCM covering tele.

## 2017-06-04 NOTE — Care Management (Signed)
Informed that patient is for discharge today.  spoke with Nettie ElmSylvia - daughter in law per request and informed there would be need for hospital bed and it needs to be in place prior to discharge.  Informed patient has BSC and walker.  Discussed mode of transport home and informed that patient would have concern of cost.  Discussed need to have portable 02 tank from home if chose to transport by car.  When CM spoke with patient, she was very short of breath.  Discussed with hospice liaison and obtaining order for roxanol.

## 2017-06-04 NOTE — Progress Notes (Signed)
Rounded with MD, Pt noted to be very dispenic, 02 sat 88% on 4L N/C. Crystal from Palliative notified. Orders for Roxanol q2hrs PRN. Will administer and continue to monitor patient.

## 2017-06-04 NOTE — Progress Notes (Signed)
[place] at [place] NAME: Shannon Wilkerson    MR#:  [id number]  DATE OF BIRTH:  February 19, 1946  SUBJECTIVE:  Pt very fatigued and tired.  Awaiting to go home with hospice care.  Very anxious.  Discussed with patient's husband over phone  REVIEW OF SYSTEMS:   Review of Systems  Constitutional: Positive for malaise/fatigue. Negative for chills, fever and weight loss.  HENT: Negative for ear discharge, ear pain and nosebleeds.   Eyes: Negative for blurred vision, pain and discharge.  Respiratory: Positive for cough, sputum production and shortness of breath. Negative for wheezing and stridor.   Cardiovascular: Negative for chest pain, palpitations, orthopnea and PND.  Gastrointestinal: Negative for abdominal pain, diarrhea, nausea and vomiting.  Genitourinary: Negative for frequency and urgency.  Musculoskeletal: Negative for back pain and joint pain.  Neurological: Positive for weakness. Negative for sensory change, speech change and focal weakness.  Psychiatric/Behavioral: Negative for depression and hallucinations. The patient is not nervous/anxious.    Tolerating Diet:some   DRUG ALLERGIES:   Allergies  Allergen Reactions  . Fluvirin [Flu Virus Vaccine] Diarrhea  . Penicillins Hives and Other (See Comments)    Has patient had a PCN reaction causing immediate rash, facial/tongue/throat swelling, SOB or lightheadedness with hypotension: No Has patient had a PCN reaction causing severe rash involving mucus membranes or skin necrosis: No Has patient had a PCN reaction that required hospitalization No Has patient had a PCN reaction occurring within the last 10 years: No If all of the above answers are "NO", then may proceed with Cephalosporin use.  . Tetanus Toxoid, Adsorbed Diarrhea  . Tetanus Toxoids Diarrhea    VITALS:  Blood pressure 133/87, pulse (!) 112, temperature 99.1 F (37.3 C), temperature source Axillary, resp. rate 20,  height '5\' 2"'  (1.575 m), weight 58.6 kg (129 lb 3.2 oz), SpO2 (!) 84 %.  PHYSICAL EXAMINATION:   Physical Exam  GENERAL:  72 y.o.-year-old patient lying in the bed with no acute distress. Appears ill EYES: Pupils equal, round, reactive to light and accommodation. No scleral icterus. Extraocular muscles intact.  HEENT: Head atraumatic, normocephalic. Oropharynx and nasopharynx clear.  NECK:  Supple, no jugular venous distention. No thyroid enlargement, no tenderness.  LUNGS:coarse breath sounds bilaterally, no wheezing, ++rales, rhonchi. No use of accessory muscles of respiration.  CARDIOVASCULAR: S1, S2 normal. No murmurs, rubs, or gallops. tachycardia ABDOMEN: Soft, nontender, nondistended. Bowel sounds present. No organomegaly or mass.  EXTREMITIES: No cyanosis, clubbing or edema b/l.    NEUROLOGIC: Cranial nerves II through XII are intact. No focal Motor or sensory deficits b/l.   PSYCHIATRIC:  patient is alert and oriented x 3.  SKIN: No obvious rash, lesion, or ulcer.   LABORATORY PANEL:  CBC Recent Labs  Lab 06/02/17 0452  WBC 9.7  HGB 9.9*  HCT 31.5*  PLT 194    Chemistries  Recent Labs  Lab 05/30/17 0801  06/02/17 0452  NA 145   < > 147*  K 4.1   < > 4.7  CL 94*   < > 100*  CO2 40*   < > 38*  GLUCOSE 137*   < > 137*  BUN 17   < > 23*  CREATININE 0.36*   < > 0.45  CALCIUM 9.8   < > 8.9  MG  --    < > 2.0  AST 26  --   --   ALT 28  --   --  ALKPHOS 111  --   --   BILITOT 0.7  --   --    < > = values in this interval not displayed.   Cardiac Enzymes Recent Labs  Lab 05/30/17 0801  TROPONINI <0.03   RADIOLOGY:  No results found. ASSESSMENT AND PLAN:  Shannon Wilkerson a 72 y.o.femalewith a past medical history of COPD on 3 L of oxygen, asthma, hypertension, CVA, presents to the emergency department for difficulty breathing. According to EMS they were called by the patient's husband for difficulty breathing. Patient lives at home, wears 3 L of oxygen  for COPD. EMS states initial O2 saturation on nasal cannula oxygen in the 60s  1.  Acute on chronic hypoxic/hypercarbic respiratory failure secondary to COPD exacerbation in the setting of right sided pneumonia - off BiPAP, on oxygen via nasal cannula -Discussed with ICU attending Dr. Mortimer Fries -IV Solu-Medrol tapering, inhalers, nebulized -IV antibiotic with Levaquin -MRSA PCR screening -Negative blood cultures   2.  Sepsis secondary to right-sided pneumonia -IV antibiotics given and treated pneumonia, discontinued antibiotics -blood cultures are negative for 2 days  3.  Transient SVT -Resolved after 1 dose of adenosine in the ER  4.  History of hypertension continue home meds when able to take oral  5.  Adult failure to thrive; palliative care is following patient is DNR now  Pt and husbancd met with Palliatve care NP to discuss goals of care---pt requesting to go home with hospice.  Patient is critically ill with poor prognosis considering, hospice care at home According to the husband patient is a DNR    Case discussed with Care Management/Social Worker. Management plans discussed with the patient, family and they are in agreement.  CODE STATUS: DNR  DVT Prophylaxis: lovenox  TOTAL TIME TAKING CARE OF THIS PATIENT: 28 minutes.  >50% time spent on counselling and coordination of care  POSSIBLE D/C IN few DAYS, DEPENDING ON CLINICAL CONDITION.  Note: This dictation was prepared with Dragon dictation along with smaller phrase technology. Any transcriptional errors that result from this process are unintentional.  Nicholes Mango M.D on 06/04/2017 at 12:03 PM  Between 7am to 6pm - Pager - [phone]  After 6pm go to [email or web address] - password [email or web address] [facility]  Office  [phone]  CC: Primary care physician; Lenard Simmer, MDPatient ID: Shannon Wilkerson, female   DOB: 01/28/1946, 72 y.o.   MRN: [id number]

## 2017-06-04 NOTE — Progress Notes (Addendum)
Daily Progress Note   Patient Name: Shannon Wilkerson       Date: 06/04/2017 DOB: February 18, 1946  Age: 72 y.o. MRN#: [id number] Attending Physician: Ramonita LabGouru, Aruna, MD Primary Care Physician: Alan MulderMorayati, Shamil J, MD Admit Date: 05/30/2017  Reason for Consultation/Follow-up: Establishing goals of care  Subjective: Patient sitting up in bed. She is alert an oriented. Pursed lip breathing. She states she does not want to live with the current quality of life she has and no longer wants aggressive care. She states she would like to go home with hospice, and spend the life she has left there.  Her husband states he feels like she will feel better once she gets home. Roxanol ordered to help with breathing. Hospice liaison in to speak with family.   Length of Stay: 5  Current Medications: Scheduled Meds:  . amLODipine  10 mg Oral Daily  . aspirin EC  325 mg Oral Daily  . atorvastatin  20 mg Oral q1800  . clopidogrel  75 mg Oral Daily  . docusate sodium  100 mg Oral BID  . enoxaparin (LOVENOX) injection  40 mg Subcutaneous Q24H  . ipratropium-albuterol  3 mL Nebulization Q6H  . levothyroxine  88 mcg Oral Daily  . loratadine  10 mg Oral Daily  . mouth rinse  15 mL Mouth Rinse BID  . methylPREDNISolone (SOLU-MEDROL) injection  40 mg Intravenous BID  . mometasone-formoterol  2 puff Inhalation BID  . roflumilast  500 mcg Oral Daily  . sodium chloride flush  3 mL Intravenous Q12H  . tiotropium  18 mcg Inhalation Daily  . venlafaxine XR  150 mg Oral Daily  . vitamin C  500 mg Oral Daily  . vitamin E  200 Units Oral Daily  . zinc sulfate  220 mg Oral Daily    Continuous Infusions:   PRN Meds: acetaminophen **OR** acetaminophen, ALPRAZolam, bisacodyl, furosemide, guaiFENesin-codeine,  ipratropium-albuterol, morphine CONCENTRATE, OLANZapine zydis, ondansetron **OR** ondansetron (ZOFRAN) IV, senna-docusate  Physical Exam  Pulmonary/Chest:  Pursed lip breathing.   Neurological: She is alert.  Oriented.  Skin: Skin is warm and dry.            Vital Signs: BP (!) 160/105 (BP Location: Left Arm)   Pulse (!) 112   Temp 97.7 F (36.5 C) (Oral)   Resp 20  Ht 5\' 2"  (1.575 m)   Wt 58.6 kg (129 lb 3.2 oz)   SpO2 (!) 79% Comment: RN in room  BMI 23.63 kg/m  SpO2: SpO2: (!) 79 %(RN in room) O2 Device: O2 Device: Nasal Cannula O2 Flow Rate: O2 Flow Rate (L/min): 4 L/min  Intake/output summary:   Intake/Output Summary (Last 24 hours) at 06/04/2017 1002 Last data filed at 06/03/2017 1812 Gross per 24 hour  Intake -  Output 250 ml  Net -250 ml   LBM: Last BM Date: 05/29/17 Baseline Weight: Weight: 63.4 kg (139 lb 11.2 oz) Most recent weight: Weight: 58.6 kg (129 lb 3.2 oz)       Palliative Assessment/Data: 30%    Flowsheet Rows     Most Recent Value  Intake Tab  Referral Department  Hospitalist  Unit at Time of Referral  Med/Surg Unit  Palliative Care Primary Diagnosis  Pulmonary  Date Notified  05/31/17  Palliative Care Type  New Palliative care  Reason for referral  Clarify Goals of Care, Counsel Regarding Hospice  Date of Admission  05/30/17  Date first seen by Palliative Care  05/31/17  # of days Palliative referral response time  0 Day(s)  # of days IP prior to Palliative referral  1  Clinical Assessment  Psychosocial & Spiritual Assessment  Palliative Care Outcomes      Patient Active Problem List   Diagnosis Date Noted  . Pressure injury of skin 05/31/2017  . Acute respiratory failure with hypoxemia (HCC) 05/30/2017  . History of depression 08/27/2016  . Altered mental status 08/08/2016  . CVA (cerebral vascular accident) (HCC) 08/06/2016  . Acute delirium 08/06/2016  . 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 Major depression  03/01/2015  . Generalized anxiety disorder 03/01/2015  . CVA (cerebral infarction) 10/27/2014  . COPD (chronic obstructive pulmonary disease) (HCC) 10/27/2014  . Chronic respiratory failure (HCC) 10/27/2014  . Anxiety 10/27/2014  . Hypertension 10/27/2014    Palliative Care Assessment & Plan   Patient Profile:  Shannon Wilkerson a71 y.o.femalewith a known history of chronic respiratory failure/COPD on chronic 3 L nasal cannula oxygen, hypertension, hypothyroidism comes to the emergency room accompanied by her husband with increasing shortness of breath breath and lethargy and was found to have sats down in the 40s-60s per EMS.   Assessment/ Recommendations/Plan:  Hospice liaison to speak with patient and family about setting up home hospice.    Code Status:    Code Status Orders  (From admission, onward)        Start     Ordered   05/30/17 1427  Do not attempt resuscitation (DNR)  Continuous    Question Answer Comment  In the event of cardiac or respiratory ARREST Do not call a "code blue"   In the event of cardiac or respiratory ARREST Do not perform Intubation, CPR, defibrillation or ACLS   In the event of cardiac or respiratory ARREST Use medication by any route, position, wound care, and other measures to relive pain and suffering. May use oxygen, suction and manual treatment of airway obstruction as needed for comfort.      05/30/17 1426    Code Status History    Date Active Date Inactive Code Status Order ID Comments User Context   08/06/2016 08:05 08/10/2016 20:26 DNR [id number]  Tonye Royalty, DO Inpatient   08/06/2016 00:11 08/06/2016 01:26 DNR [id number]  Blanca Friend, RN ED   02/28/2015 21:36 03/02/2015 18:09 Full Code [id number]  Ramonita Lab, MD Inpatient  10/27/2014 20:44 10/28/2014 19:01 Full Code [id number]  Gale Journey, MD Inpatient    Advance Directive Documentation     Most Recent Value  Type of Advance Directive  Healthcare Power of Attorney, Living  will  Pre-existing out of facility DNR order (yellow form or pink MOST form)  No data  "MOST" Form in Place?  No data       Prognosis:   < 6 months Dyspnea. COPD.   Discharge Planning:  Home with Hospice  Care plan was discussed with Hospice liaison.   Thank you for allowing the Palliative Medicine Team to assist in the care of this patient.   Total Time 25 min Prolonged Time Billed  No      Greater than 50%  of this time was spent counseling and coordinating care related to the above assessment and plan.  Morton Stall, NP  Please contact Palliative Medicine Team phone at [phone] for questions and concerns.

## 2017-06-04 NOTE — Progress Notes (Signed)
New referral for [facility] services received from [place] [facility] Patient is a 72 year old woman with end stage COPD admitted to [facility] from home on 2/7 for treatment of acute respiratory failure. She required BIPAP, now on 4 liters via nasal cannula. Palliative Medicine was consulted for goals of care and have been following patient. She and her family have chosen to discharge home with hospice services. Writer met in the room with patient and her husband to initiate education regarding hospice services, philosophy and team approach to care with good understanding voiced. Patient appeared very dyspneic, discussed with Palliative Medicine NP Crystal Griffin, liquid morphine 5 mg Q 4 hrs PRN was ordered. Writer provided education regarding symptom management and benefits of liquid morphine for dyspnea. Patient did become more relaxed as visit progressed. Hospital bed requested and will be delivered tomorrow morning. Plan is for discharge home tomorrow after bed is delivered. Patient will require EMS transport at discharge. Hospital care team updated. Signed DNR to accompany patient. Patient information faxed to referral. Will continue to follow through final disposition. Flo Shanks RN, BSN, [place] [facility], hospital Liaison [phone]

## 2017-06-05 MED ORDER — MORPHINE SULFATE (CONCENTRATE) 10 MG/0.5ML PO SOLN: 5.0000 mg | ORAL | 0 refills | Status: AC | PRN

## 2017-06-05 MED ORDER — AMLODIPINE BESYLATE 10 MG PO TABS: 10.0000 mg | ORAL_TABLET | Freq: Every day | ORAL | 0 refills | Status: AC

## 2017-06-05 MED ORDER — ALPRAZOLAM 0.5 MG PO TABS: 0.5000 mg | ORAL_TABLET | Freq: Four times a day (QID) | ORAL | 0 refills | Status: AC | PRN

## 2017-06-05 MED ORDER — NYSTATIN 100000 UNIT/ML MT SUSP: 1.0000 mL | OROMUCOSAL | 0 refills | Status: AC | PRN

## 2017-06-05 MED ORDER — PREDNISONE 10 MG (21) PO TBPK: 10.0000 mg | ORAL_TABLET | Freq: Every day | ORAL | 0 refills | Status: AC

## 2017-06-05 MED ORDER — SENNOSIDES-DOCUSATE SODIUM 8.6-50 MG PO TABS: 1.0000 | ORAL_TABLET | Freq: Two times a day (BID) | ORAL | Status: AC | PRN

## 2017-06-05 NOTE — Progress Notes (Signed)
Patient up for d/c per MD order to home with hospice. Awaiting [facility] for transportation.

## 2017-06-05 NOTE — Progress Notes (Signed)
Follow up visit made. Patient seen lying in bed, Chaplain at bedside. Patient relating a dream she had overnight. Breathing appears easier, unlabored. Plan remains for discharge home via EMS today AFTER DME is delivered. Signed DNR and prescriptions in place in discharge packet, staff RN Alcario Droughtrica made aware. Discharge summary faxed to referral. Will continue to follow through final disposition. Dayna BarkerKaren Robertson RN, BSN, [facility] [place], [facility] [phone]

## 2017-06-05 NOTE — Discharge Summary (Signed)
[facility] NAME: Shannon Wilkerson    MR#:  [id number]  DATE OF BIRTH:  [birth year]  DATE OF ADMISSION:  05/30/2017 ADMITTING PHYSICIAN: Fritzi Mandes, MD  DATE OF DISCHARGE: 06/05/17  PRIMARY CARE PHYSICIAN: Lenard Simmer, MD    ADMISSION DIAGNOSIS:  SVT (supraventricular tachycardia) (HCC) [I47.1] Hypoxia [R09.02] Sepsis, due to unspecified organism ([place]) [A41.9] Community acquired pneumonia of right middle lobe of lung ([place]) [J18.1]  DISCHARGE DIAGNOSIS:  Active Problems:   Acute respiratory failure with hypoxemia ([place])   Pressure injury of skin   SECONDARY DIAGNOSIS:   Past Medical History:  Diagnosis Date  . Anemia    past hx  . Anxiety   . Asthma   . COPD (chronic obstructive pulmonary disease) ([place])   . Hypertension   . Hypothyroidism   . Neck stiffness    limited movement s/p fusion c4-c5, c5-c6  . Shortness of breath dyspnea    exertion  . TIA (transient ischemic attack)    vision issues    HOSPITAL COURSE:   Shannon Wilkerson  is a 72 y.o. female with a known history of chronic respiratory failure/COPD on chronic 3 L nasal cannula oxygen, hypertension, hypothyroidism comes to the emergency room accompanied by her husband with increasing shortness of breath breath and lethargy and was found to have sats down in the 40s-60s per EMS. Patient currently is on BiPAP sats are 99%. She is found to have right middle lobe pneumonia.  Patient had some URI symptoms few days ago according to the husband. She received IV Levaquin, aztreonam, vancomycin Patient is being admitted with sepsis secondary to pneumonia and acute on chronic respiratory failure secondary to COPD exacerbation. Patient also had SVT which resolved after dose of IV adenosine    1. Acute on chronic hypoxic/hypercarbic respiratory failure secondary to COPD exacerbation in the setting of right sided pneumonia -Patient is going home with hospice care  as she is failure to thrive with no improvement in breathing - off BiPAP, on oxygen via nasal cannula -IV Solu-Medrol tapering, inhalers, nebulized -IV antibiotic with Levaquin -MRSA PCR screening -Negative blood cultures    2. Sepsis secondary to right-sided pneumonia -IV antibiotics given and treated pneumonia, discontinued antibiotics -blood cultures are negative for 2 days  3. Transient SVT -Resolved after 1 dose of adenosine in the ER  4. History of hypertension continue home meds when able to take oral  5.  Adult failure to thrive; palliative care is following patient is DNR now  Pt and husbancd met with Palliatve care NP to discuss goals of care---pt requesting to go home with hospice. Discharge patient with hospice care at home    DISCHARGE CONDITIONS:   guarded  CONSULTS OBTAINED:     PROCEDURES  bipap  DRUG ALLERGIES:   Allergies  Allergen Reactions  . Fluvirin [Flu Virus Vaccine] Diarrhea  . Penicillins Hives and Other (See Comments)    Has patient had a PCN reaction causing immediate rash, facial/tongue/throat swelling, SOB or lightheadedness with hypotension: No Has patient had a PCN reaction causing severe rash involving mucus membranes or skin necrosis: No Has patient had a PCN reaction that required hospitalization No Has patient had a PCN reaction occurring within the last 10 years: No If all of the above answers are "NO", then may proceed with Cephalosporin use.  . Tetanus Toxoid, Adsorbed Diarrhea  . Tetanus Toxoids Diarrhea    DISCHARGE MEDICATIONS:   Allergies as of 06/05/2017  Reactions   Fluvirin [flu Virus Vaccine] Diarrhea   Penicillins Hives, Other (See Comments)   Has patient had a PCN reaction causing immediate rash, facial/tongue/throat swelling, SOB or lightheadedness with hypotension: No Has patient had a PCN reaction causing severe rash involving mucus membranes or skin necrosis: No Has patient had a PCN reaction that  required hospitalization No Has patient had a PCN reaction occurring within the last 10 years: No If all of the above answers are "NO", then may proceed with Cephalosporin use.   Tetanus Toxoid, Adsorbed Diarrhea   Tetanus Toxoids Diarrhea      Medication List    STOP taking these medications   diphenhydramine-acetaminophen 25-500 MG Tabs tablet Commonly known as:  TYLENOL PM   simvastatin 40 MG tablet Commonly known as:  ZOCOR   vitamin C 500 MG tablet Commonly known as:  ASCORBIC ACID   Vitamin D (Ergocalciferol) 50000 units Caps capsule Commonly known as:  DRISDOL   vitamin E 200 UNIT capsule   zinc gluconate 50 MG tablet     TAKE these medications   acetaminophen 325 MG tablet Commonly known as:  TYLENOL Take 2 tablets (650 mg total) by mouth every 6 (six) hours as needed for mild pain or fever.   ALPRAZolam 0.5 MG tablet Commonly known as:  XANAX Take 1 tablet (0.5 mg total) by mouth every 6 (six) hours as needed for anxiety. What changed:  when to take this   amLODipine 10 MG tablet Commonly known as:  NORVASC Take 1 tablet (10 mg total) by mouth daily. Start taking on:  06/06/2017   aspirin 81 MG EC tablet Take 1 tablet (81 mg total) by mouth daily. What changed:  how much to take   clopidogrel 75 MG tablet Commonly known as:  PLAVIX Take 75 mg by mouth daily.   docusate sodium 100 MG capsule Commonly known as:  COLACE Take 1 capsule by mouth at bedtime.   ipratropium-albuterol 0.5-2.5 (3) MG/3ML Soln Commonly known as:  DUONEB Take 3 mLs by nebulization every 6 (six) hours as needed.   LASIX 40 MG tablet Generic drug:  furosemide Take 20 mg by mouth daily.   levalbuterol 45 MCG/ACT inhaler Commonly known as:  XOPENEX HFA 2 puff inh every  4 to 6 hrs prn for sob and wheezing What changed:    how much to take  how to take this  when to take this  reasons to take this  additional instructions   loratadine 10 MG tablet Commonly known  as:  CLARITIN Take 10 mg by mouth daily.   mometasone-formoterol 100-5 MCG/ACT Aero Commonly known as:  DULERA Inhale 2 puffs into the lungs 2 (two) times daily.   montelukast 10 MG tablet Commonly known as:  SINGULAIR Take 10 mg by mouth daily.   morphine CONCENTRATE 10 MG/0.5ML Soln concentrated solution Take 0.25 mLs (5 mg total) by mouth every 2 (two) hours as needed for moderate pain, severe pain or shortness of breath.   nystatin 100000 UNIT/ML suspension Commonly known as:  MYCOSTATIN Take 1 mL (100,000 Units total) by mouth as needed.   OLANZapine zydis 5 MG disintegrating tablet Commonly known as:  ZYPREXA Take 1 tablet (5 mg total) by mouth at bedtime.   OXYGEN Inhale 2.5 L into the lungs continuous.   predniSONE 10 MG (21) Tbpk tablet Commonly known as:  STERAPRED UNI-PAK 21 TAB Take 1 tablet (10 mg total) by mouth daily. Take 6 tablets by mouth for 1 day  followed by  5 tablets by mouth for 1 day followed by  4 tablets by mouth for 1 day followed by  3 tablets by mouth for 1 day followed by  2 tablets by mouth for 1 day followed by  1 tablet by mouth for a day and stop   roflumilast 500 MCG Tabs tablet Commonly known as:  DALIRESP Take 500 mcg by mouth daily.   senna-docusate 8.6-50 MG tablet Commonly known as:  Senokot-S Take 1 tablet by mouth 2 (two) times daily as needed for mild constipation.   SPIRIVA HANDIHALER 18 MCG inhalation capsule Generic drug:  tiotropium Place 18 mcg into inhaler and inhale daily.   SYNTHROID 88 MCG tablet Generic drug:  levothyroxine Take 88 mcg by mouth daily.   venlafaxine 75 MG tablet Commonly known as:  EFFEXOR Take 150 mg by mouth daily.        DISCHARGE INSTRUCTIONS:     DIET:  Regular diet  DISCHARGE CONDITION:  Fair  ACTIVITY:  Activity as tolerated  OXYGEN:  Home Oxygen: Yes.     Oxygen Delivery: 2-4 liters/min via Patient connected to nasal cannula oxygen  DISCHARGE LOCATION:  home   If  you experience worsening of your admission symptoms, develop shortness of breath, life threatening emergency, suicidal or homicidal thoughts you must seek medical attention immediately by calling 911 or calling your MD immediately  if symptoms less severe.  You Must read complete instructions/literature along with all the possible adverse reactions/side effects for all the Medicines you take and that have been prescribed to you. Take any new Medicines after you have completely understood and accpet all the possible adverse reactions/side effects.   Please note  You were cared for by a hospitalist during your hospital stay. If you have any questions about your discharge medications or the care you received while you were in the hospital after you are discharged, you can call the unit and asked to speak with the hospitalist on call if the hospitalist that took care of you is not available. Once you are discharged, your primary care physician will handle any further medical issues. Please note that NO REFILLS for any discharge medications will be authorized once you are discharged, as it is imperative that you return to your primary care physician (or establish a relationship with a primary care physician if you do not have one) for your aftercare needs so that they can reassess your need for medications and monitor your lab values.     Today  Chief Complaint  Patient presents with  . Respiratory Distress   Patient is anxious and breathing through her mouth.  Reports morphine and exam Helping.  Awaiting to go home with hospice care  ROS: Limited as patient is uncomfortable with shortness of breath which gets worse with exertion and talking CONSTITUTIONAL: Denies fevers, chills. Denies any fatigue, weakness.  EYES: Denies blurry vision, double vision, eye pain. RESPIRATORY: Denies cough, wheeze, shortness of breath.  CARDIOVASCULAR: Denies chest pain, palpitations, edema.  GASTROINTESTINAL:  Denies nausea, vomiting, diarrhea, abdominal pain. Denies bright red blood per rectum.. MUSCULOSKELETAL: Denies pain in neck, back, shoulder, knees, hips or arthritic symptoms.  NEUROLOGIC: Denies paralysis, paresthesias.  PSYCHIATRIC: Very anxious   VITAL SIGNS:  Blood pressure (!) 145/76, pulse 95, temperature 98.5 F (36.9 C), temperature source Oral, resp. rate 18, height '5\' 2"'  (1.575 m), weight 58.6 kg (129 lb 3.2 oz), SpO2 91 %.  I/O:    Intake/Output Summary (Last 24 hours) at  06/05/2017 0954 Last data filed at 06/05/2017 0806 Gross per 24 hour  Intake 120 ml  Output 1300 ml  Net -1180 ml    PHYSICAL EXAMINATION:  GENERAL:  72 y.o.-year-old patient lying in the bed with no acute distress.  EYES: Pupils equal, round, reactive to light and accommodation. No scleral icterus. Extraocular muscles intact.  HEENT: Head atraumatic, normocephalic. Oropharynx and nasopharynx clear.  NECK:  Supple, no jugular venous distention. No thyroid enlargement, no tenderness.  LUNGS: Diminished breath sounds bilaterally, no wheezing, rales,rhonchi or crepitation. No use of accessory muscles of respiration.  CARDIOVASCULAR: S1, S2 normal. No murmurs, rubs, or gallops.  ABDOMEN: Soft, non-tender, non-distended. Bowel sounds present. No organomegaly or mass.  EXTREMITIES: No pedal edema, cyanosis, or clubbing.  NEUROLOGIC: Awake and alert, oriented x3.  PSYCHIATRIC: The patient is alert and oriented x 3.  SKIN: No obvious rash, lesion, or ulcer.   DATA REVIEW:   CBC Recent Labs  Lab 06/02/17 0452  WBC 9.7  HGB 9.9*  HCT 31.5*  PLT 194    Chemistries  Recent Labs  Lab 05/30/17 0801  06/02/17 0452  NA 145   < > 147*  K 4.1   < > 4.7  CL 94*   < > 100*  CO2 40*   < > 38*  GLUCOSE 137*   < > 137*  BUN 17   < > 23*  CREATININE 0.36*   < > 0.45  CALCIUM 9.8   < > 8.9  MG  --    < > 2.0  AST 26  --   --   ALT 28  --   --   ALKPHOS 111  --   --   BILITOT 0.7  --   --    < > =  values in this interval not displayed.    Cardiac Enzymes Recent Labs  Lab 05/30/17 0801  TROPONINI <0.03    Microbiology Results  Results for orders placed or performed during the hospital encounter of 05/30/17  Culture, blood (Routine x 2)     Status: None   Collection Time: 05/30/17  7:56 AM  Result Value Ref Range Status   Specimen Description BLOOD BLOOD RIGHT ARM  Final   Special Requests   Final    BOTTLES DRAWN AEROBIC AND ANAEROBIC Blood Culture adequate volume   Culture   Final    NO GROWTH 5 DAYS Performed at [facility], [address], [place], [place] [address]    Report Status 06/04/2017 FINAL  Final  Culture, blood (Routine x 2)     Status: None   Collection Time: 05/30/17  7:56 AM  Result Value Ref Range Status   Specimen Description BLOOD BLOOD LEFT ARM  Final   Special Requests   Final    BOTTLES DRAWN AEROBIC AND ANAEROBIC Blood Culture adequate volume   Culture   Final    NO GROWTH 5 DAYS Performed at [facility], [address], [place], [place] [address]    Report Status 06/04/2017 FINAL  Final  Urine culture     Status: None   Collection Time: 05/30/17  8:02 AM  Result Value Ref Range Status   Specimen Description   Final    URINE, RANDOM Performed at [facility], [address], [place], [place] [address]    Special Requests   Final    NONE Performed at [facility], [address] [place],  [address]    Culture  Final    NO GROWTH Performed at [facility], [place] [address], [place], [place] [address]    Report Status 05/31/2017 FINAL  Final  MRSA PCR Screening     Status: None   Collection Time: 05/30/17  5:00 PM  Result Value Ref Range Status   MRSA by PCR NEGATIVE NEGATIVE Final    Comment:        The GeneXpert MRSA Assay (FDA approved for NASAL specimens only), is one component of a comprehensive MRSA colonization surveillance program. It is not intended to diagnose  MRSA infection nor to guide or monitor treatment for MRSA infections. Performed at [facility], [address] [place],  [address]     RADIOLOGY:  Dg Chest Port 1 View  Result Date: 06/02/2017 CLINICAL DATA:  Pneumonia EXAM: PORTABLE CHEST 1 VIEW COMPARISON:  05/30/2017 FINDINGS: Consolidation in the right lower lobe compatible with pneumonia. Minimal left base atelectasis or pneumonia. There is hyperinflation of the lungs compatible with COPD. Small right effusion. Heart is borderline in size. IMPRESSION: Right lower lobe consolidation compatible with pneumonia. Small right effusion. Minimal left base atelectasis or infiltrate. COPD. Electronically Signed   By: Rolm Baptise M.D.   On: 06/02/2017 07:34    EKG:   Orders placed or performed during the hospital encounter of 05/30/17  . ED EKG 12-Lead  . ED EKG 12-Lead  . EKG 12-Lead  . EKG 12-Lead  . EKG 12-Lead  . EKG 12-Lead      Management plans discussed with the patient, family and they are in agreement.  CODE STATUS:     Code Status Orders  (From admission, onward)        Start     Ordered   05/30/17 1427  Do not attempt resuscitation (DNR)  Continuous    Question Answer Comment  In the event of cardiac or respiratory ARREST Do not call a "code blue"   In the event of cardiac or respiratory ARREST Do not perform Intubation, CPR, defibrillation or ACLS   In the event of cardiac or respiratory ARREST Use medication by any route, position, wound care, and other measures to relive pain and suffering. May use oxygen, suction and manual treatment of airway obstruction as needed for comfort.      05/30/17 1426    Code Status History    Date Active Date Inactive Code Status Order ID Comments User Context   08/06/2016 08:05 08/10/2016 20:26 DNR [id number]  Harvie Bridge, DO Inpatient   08/06/2016 00:11 08/06/2016 01:26 DNR [id number]  Cindi Carbon, RN ED   02/28/2015 21:36 03/02/2015 18:09 Full Code  [id number]  Nicholes Mango, MD Inpatient   10/27/2014 20:44 10/28/2014 19:01 Full Code [id number]  Aldean Jewett, MD Inpatient    Advance Directive Documentation     Most Recent Value  Type of Advance Directive  Healthcare Power of Attorney, Living will  Pre-existing out of facility DNR order (yellow form or pink MOST form)  No data  "MOST" Form in Place?  No data      TOTAL TIME TAKING CARE OF THIS PATIENT: 41 minutes.   Note: This dictation was prepared with Dragon dictation along with smaller phrase technology. Any transcriptional errors that result from this process are unintentional.   '@[facility]' @  on 06/05/2017 at 9:54 AM  Between 7am to 6pm - Pager - [phone]  After 6pm go to [email or web address] - password [email or web address] [facility]  [facility]  Office  [phone]  CC:  Primary care physician; Lenard Simmer, MD

## 2017-06-05 NOTE — Progress Notes (Signed)
Percell Belt Notarianni to be D/C'd Home with hospice per MD order.  Discussed prescriptions and follow up appointments with the patient. Prescriptions given to patient, medication list explained in detail. Pt verbalized understanding.  Allergies as of 06/05/2017      Reactions   Fluvirin [flu Virus Vaccine] Diarrhea   Penicillins Hives, Other (See Comments)   Has patient had a PCN reaction causing immediate rash, facial/tongue/throat swelling, SOB or lightheadedness with hypotension: No Has patient had a PCN reaction causing severe rash involving mucus membranes or skin necrosis: No Has patient had a PCN reaction that required hospitalization No Has patient had a PCN reaction occurring within the last 10 years: No If all of the above answers are "NO", then may proceed with Cephalosporin use.   Tetanus Toxoid, Adsorbed Diarrhea   Tetanus Toxoids Diarrhea      Medication List    STOP taking these medications   diphenhydramine-acetaminophen 25-500 MG Tabs tablet Commonly known as:  TYLENOL PM   simvastatin 40 MG tablet Commonly known as:  ZOCOR   vitamin C 500 MG tablet Commonly known as:  ASCORBIC ACID   Vitamin D (Ergocalciferol) 50000 units Caps capsule Commonly known as:  DRISDOL   vitamin E 200 UNIT capsule   zinc gluconate 50 MG tablet     TAKE these medications   acetaminophen 325 MG tablet Commonly known as:  TYLENOL Take 2 tablets (650 mg total) by mouth every 6 (six) hours as needed for mild pain or fever.   ALPRAZolam 0.5 MG tablet Commonly known as:  XANAX Take 1 tablet (0.5 mg total) by mouth every 6 (six) hours as needed for anxiety. What changed:  when to take this   amLODipine 10 MG tablet Commonly known as:  NORVASC Take 1 tablet (10 mg total) by mouth daily. Start taking on:  06/06/2017   aspirin 81 MG EC tablet Take 1 tablet (81 mg total) by mouth daily. What changed:  how much to take   clopidogrel 75 MG tablet Commonly known as:  PLAVIX Take 75 mg by  mouth daily.   docusate sodium 100 MG capsule Commonly known as:  COLACE Take 1 capsule by mouth at bedtime.   ipratropium-albuterol 0.5-2.5 (3) MG/3ML Soln Commonly known as:  DUONEB Take 3 mLs by nebulization every 6 (six) hours as needed.   LASIX 40 MG tablet Generic drug:  furosemide Take 20 mg by mouth daily.   levalbuterol 45 MCG/ACT inhaler Commonly known as:  XOPENEX HFA 2 puff inh every  4 to 6 hrs prn for sob and wheezing What changed:    how much to take  how to take this  when to take this  reasons to take this  additional instructions   loratadine 10 MG tablet Commonly known as:  CLARITIN Take 10 mg by mouth daily.   mometasone-formoterol 100-5 MCG/ACT Aero Commonly known as:  DULERA Inhale 2 puffs into the lungs 2 (two) times daily.   montelukast 10 MG tablet Commonly known as:  SINGULAIR Take 10 mg by mouth daily.   morphine CONCENTRATE 10 MG/0.5ML Soln concentrated solution Take 0.25 mLs (5 mg total) by mouth every 2 (two) hours as needed for moderate pain, severe pain or shortness of breath.   nystatin 100000 UNIT/ML suspension Commonly known as:  MYCOSTATIN Take 1 mL (100,000 Units total) by mouth as needed.   OLANZapine zydis 5 MG disintegrating tablet Commonly known as:  ZYPREXA Take 1 tablet (5 mg total) by mouth at bedtime.  OXYGEN Inhale 2.5 L into the lungs continuous.   predniSONE 10 MG (21) Tbpk tablet Commonly known as:  STERAPRED UNI-PAK 21 TAB Take 1 tablet (10 mg total) by mouth daily. Take 6 tablets by mouth for 1 day followed by  5 tablets by mouth for 1 day followed by  4 tablets by mouth for 1 day followed by  3 tablets by mouth for 1 day followed by  2 tablets by mouth for 1 day followed by  1 tablet by mouth for a day and stop   roflumilast 500 MCG Tabs tablet Commonly known as:  DALIRESP Take 500 mcg by mouth daily.   senna-docusate 8.6-50 MG tablet Commonly known as:  Senokot-S Take 1 tablet by mouth 2 (two)  times daily as needed for mild constipation.   SPIRIVA HANDIHALER 18 MCG inhalation capsule Generic drug:  tiotropium Place 18 mcg into inhaler and inhale daily.   SYNTHROID 88 MCG tablet Generic drug:  levothyroxine Take 88 mcg by mouth daily.   venlafaxine 75 MG tablet Commonly known as:  EFFEXOR Take 150 mg by mouth daily.       Vitals:   06/05/17 1420 06/05/17 1423  BP:  122/81  Pulse:  (!) 109  Resp:  18  Temp:    SpO2: 92% 92%    Tele box removed and returned.Skin clean, dry and intact without evidence of skin break down, no evidence of skin tears noted. IV catheter discontinued intact. Site without signs and symptoms of complications. Dressing and pressure applied. Pt denies pain at this time. No complaints noted.  An After Visit Summary was printed and given to the patient. Patient escorted via Proofreaderstrecher and D/C home via EMS Rigoberto NoelErica Y Lacreasha Hinds

## 2017-06-12 ENCOUNTER — Ambulatory Visit: Payer: Self-pay | Admitting: Internal Medicine

## 2017-08-21 DEATH — deceased

## 2017-08-27 ENCOUNTER — Ambulatory Visit: Payer: Self-pay | Admitting: Internal Medicine
